# Patient Record
Sex: Female | Born: 1945
Health system: Southern US, Community
[De-identification: ages and names within clinical notes are randomized; demographics above are authoritative.]

## PROBLEM LIST (undated history)

## (undated) DIAGNOSIS — T7840XA Allergy, unspecified, initial encounter: Secondary | ICD-10-CM

## (undated) DIAGNOSIS — Z5189 Encounter for other specified aftercare: Secondary | ICD-10-CM

## (undated) DIAGNOSIS — K5790 Diverticulosis of intestine, part unspecified, without perforation or abscess without bleeding: Secondary | ICD-10-CM

## (undated) DIAGNOSIS — IMO0001 Reserved for inherently not codable concepts without codable children: Secondary | ICD-10-CM

## (undated) DIAGNOSIS — H269 Unspecified cataract: Secondary | ICD-10-CM

## (undated) DIAGNOSIS — M1990 Unspecified osteoarthritis, unspecified site: Secondary | ICD-10-CM

## (undated) DIAGNOSIS — E039 Hypothyroidism, unspecified: Secondary | ICD-10-CM

## (undated) DIAGNOSIS — M8580 Other specified disorders of bone density and structure, unspecified site: Secondary | ICD-10-CM

## (undated) HISTORY — DX: Diverticulosis of intestine, part unspecified, without perforation or abscess without bleeding: K57.90

## (undated) HISTORY — PX: BUNIONECTOMY: SHX129

## (undated) HISTORY — DX: Allergy, unspecified, initial encounter: T78.40XA

## (undated) HISTORY — DX: Reserved for inherently not codable concepts without codable children: IMO0001

## (undated) HISTORY — PX: TONSILLECTOMY: SUR1361

## (undated) HISTORY — PX: BLEPHAROPLASTY: SUR158

## (undated) HISTORY — DX: Encounter for other specified aftercare: Z51.89

## (undated) HISTORY — DX: Other specified disorders of bone density and structure, unspecified site: M85.80

## (undated) HISTORY — PX: TUBAL LIGATION: SHX77

## (undated) HISTORY — DX: Unspecified cataract: H26.9

## (undated) HISTORY — PX: ABDOMINAL HYSTERECTOMY: SHX81

## (undated) HISTORY — DX: Unspecified osteoarthritis, unspecified site: M19.90

## (undated) HISTORY — DX: Hypothyroidism, unspecified: E03.9

---

## 1986-11-03 HISTORY — PX: VAGINAL HYSTERECTOMY: SHX2639

## 1999-09-09 ENCOUNTER — Other Ambulatory Visit: Admission: RE | Admit: 1999-09-09 | Discharge: 1999-09-09 | Payer: Self-pay | Admitting: Obstetrics and Gynecology

## 2000-09-10 ENCOUNTER — Other Ambulatory Visit: Admission: RE | Admit: 2000-09-10 | Discharge: 2000-09-10 | Payer: Self-pay | Admitting: Obstetrics and Gynecology

## 2001-09-13 ENCOUNTER — Other Ambulatory Visit: Admission: RE | Admit: 2001-09-13 | Discharge: 2001-09-13 | Payer: Self-pay | Admitting: Obstetrics and Gynecology

## 2001-11-03 DIAGNOSIS — K5790 Diverticulosis of intestine, part unspecified, without perforation or abscess without bleeding: Secondary | ICD-10-CM

## 2001-11-03 HISTORY — DX: Diverticulosis of intestine, part unspecified, without perforation or abscess without bleeding: K57.90

## 2002-06-16 LAB — HM COLONOSCOPY

## 2004-10-04 ENCOUNTER — Ambulatory Visit: Payer: Self-pay | Admitting: Internal Medicine

## 2004-10-10 ENCOUNTER — Ambulatory Visit: Payer: Self-pay | Admitting: Internal Medicine

## 2004-12-02 ENCOUNTER — Ambulatory Visit (HOSPITAL_COMMUNITY): Admission: RE | Admit: 2004-12-02 | Discharge: 2004-12-02 | Payer: Self-pay | Admitting: Internal Medicine

## 2004-12-02 ENCOUNTER — Ambulatory Visit: Payer: Self-pay | Admitting: Internal Medicine

## 2005-11-06 ENCOUNTER — Ambulatory Visit: Payer: Self-pay | Admitting: Internal Medicine

## 2005-11-11 ENCOUNTER — Ambulatory Visit: Payer: Self-pay | Admitting: Internal Medicine

## 2006-09-14 ENCOUNTER — Ambulatory Visit: Payer: Self-pay | Admitting: Internal Medicine

## 2006-12-04 DIAGNOSIS — E039 Hypothyroidism, unspecified: Secondary | ICD-10-CM

## 2006-12-04 HISTORY — DX: Hypothyroidism, unspecified: E03.9

## 2006-12-16 LAB — CONVERTED CEMR LAB: Pap Smear: NORMAL

## 2006-12-22 ENCOUNTER — Ambulatory Visit: Payer: Self-pay | Admitting: Internal Medicine

## 2006-12-22 LAB — CONVERTED CEMR LAB
AST: 20 units/L (ref 0–37)
Alkaline Phosphatase: 46 units/L (ref 39–117)
BUN: 17 mg/dL (ref 6–23)
Basophils Relative: 0.9 % (ref 0.0–1.0)
CO2: 29 meq/L (ref 19–32)
Chloride: 107 meq/L (ref 96–112)
Creatinine, Ser: 0.8 mg/dL (ref 0.4–1.2)
HCT: 42.2 % (ref 36.0–46.0)
Hemoglobin: 14.3 g/dL (ref 12.0–15.0)
Leukocytes, UA: NEGATIVE
Monocytes Absolute: 0.8 10*3/uL — ABNORMAL HIGH (ref 0.2–0.7)
Monocytes Relative: 15.4 % — ABNORMAL HIGH (ref 3.0–11.0)
Neutrophils Relative %: 48.8 % (ref 43.0–77.0)
Potassium: 4.1 meq/L (ref 3.5–5.1)
RDW: 11.9 % (ref 11.5–14.6)
Specific Gravity, Urine: 1.025 (ref 1.000–1.03)
TSH: 9.12 microintl units/mL — ABNORMAL HIGH (ref 0.35–5.50)
Total Bilirubin: 0.7 mg/dL (ref 0.3–1.2)
Total Protein, Urine: NEGATIVE mg/dL
Total Protein: 6.5 g/dL (ref 6.0–8.3)
Urobilinogen, UA: 0.2 (ref 0.0–1.0)
VLDL: 9 mg/dL (ref 0–40)
pH: 6 (ref 5.0–8.0)

## 2006-12-29 ENCOUNTER — Ambulatory Visit: Payer: Self-pay | Admitting: Internal Medicine

## 2007-02-24 ENCOUNTER — Ambulatory Visit: Payer: Self-pay | Admitting: Internal Medicine

## 2007-07-08 ENCOUNTER — Encounter: Payer: Self-pay | Admitting: Internal Medicine

## 2007-07-08 ENCOUNTER — Ambulatory Visit: Payer: Self-pay | Admitting: Internal Medicine

## 2007-07-08 DIAGNOSIS — L568 Other specified acute skin changes due to ultraviolet radiation: Secondary | ICD-10-CM | POA: Insufficient documentation

## 2007-07-08 DIAGNOSIS — H1013 Acute atopic conjunctivitis, bilateral: Secondary | ICD-10-CM | POA: Insufficient documentation

## 2007-07-08 DIAGNOSIS — N951 Menopausal and female climacteric states: Secondary | ICD-10-CM | POA: Insufficient documentation

## 2007-07-08 DIAGNOSIS — J309 Allergic rhinitis, unspecified: Secondary | ICD-10-CM | POA: Insufficient documentation

## 2007-08-25 ENCOUNTER — Ambulatory Visit: Payer: Self-pay | Admitting: Internal Medicine

## 2007-09-13 ENCOUNTER — Encounter: Payer: Self-pay | Admitting: Internal Medicine

## 2007-11-12 ENCOUNTER — Encounter: Payer: Self-pay | Admitting: Internal Medicine

## 2007-12-24 ENCOUNTER — Ambulatory Visit: Payer: Self-pay | Admitting: Internal Medicine

## 2007-12-24 LAB — CONVERTED CEMR LAB
AST: 20 units/L (ref 0–37)
Albumin: 4.2 g/dL (ref 3.5–5.2)
Alkaline Phosphatase: 47 units/L (ref 39–117)
BUN: 22 mg/dL (ref 6–23)
Basophils Absolute: 0 10*3/uL (ref 0.0–0.1)
Bilirubin Urine: NEGATIVE
Chloride: 105 meq/L (ref 96–112)
Creatinine, Ser: 0.9 mg/dL (ref 0.4–1.2)
Eosinophils Absolute: 0.1 10*3/uL (ref 0.0–0.6)
GFR calc non Af Amer: 67 mL/min
HDL: 64.6 mg/dL (ref >39.0)
Hemoglobin, Urine: NEGATIVE
LDL Cholesterol: 92 mg/dL (ref 0–99)
MCHC: 33.1 g/dL (ref 30.0–36.0)
MCV: 91.2 fL (ref 78.0–100.0)
Monocytes Relative: 16.2 % — ABNORMAL HIGH (ref 3.0–11.0)
Potassium: 4.4 meq/L (ref 3.5–5.1)
RBC: 4.62 M/uL (ref 3.87–5.11)
RDW: 12 % (ref 11.5–14.6)
TSH: 2.87 microintl units/mL (ref 0.35–5.50)
Total Bilirubin: 0.8 mg/dL (ref 0.3–1.2)
Total CHOL/HDL Ratio: 2.8
Triglycerides: 130 mg/dL (ref 0–149)
Urine Glucose: NEGATIVE mg/dL
Urobilinogen, UA: 0.2 (ref 0.0–1.0)
VLDL: 26 mg/dL (ref 0–40)
pH: 5.5 (ref 5.0–8.0)

## 2007-12-31 ENCOUNTER — Ambulatory Visit: Payer: Self-pay | Admitting: Internal Medicine

## 2008-02-28 ENCOUNTER — Encounter: Payer: Self-pay | Admitting: Internal Medicine

## 2008-08-21 ENCOUNTER — Encounter: Payer: Self-pay | Admitting: Internal Medicine

## 2008-08-22 ENCOUNTER — Encounter: Payer: Self-pay | Admitting: Internal Medicine

## 2008-12-15 ENCOUNTER — Telehealth: Payer: Self-pay | Admitting: Internal Medicine

## 2009-02-05 ENCOUNTER — Ambulatory Visit: Payer: Self-pay | Admitting: Internal Medicine

## 2009-02-05 LAB — CONVERTED CEMR LAB
ALT: 19 units/L (ref 0–35)
AST: 20 units/L (ref 0–37)
Albumin: 3.9 g/dL (ref 3.5–5.2)
Alkaline Phosphatase: 53 units/L (ref 39–117)
BUN: 16 mg/dL (ref 6–23)
Basophils Absolute: 0 10*3/uL (ref 0.0–0.1)
Bilirubin Urine: NEGATIVE
Bilirubin, Direct: 0.1 mg/dL (ref 0.0–0.3)
Calcium: 8.8 mg/dL (ref 8.4–10.5)
Cholesterol: 211 mg/dL — ABNORMAL HIGH (ref 0–200)
Creatinine, Ser: 0.9 mg/dL (ref 0.4–1.2)
Direct LDL: 97.6 mg/dL
Eosinophils Relative: 2.4 % (ref 0.0–5.0)
GFR calc non Af Amer: 66.78 mL/min (ref >60)
Glucose, Bld: 79 mg/dL (ref 70–99)
HCT: 41.6 % (ref 36.0–46.0)
HDL: 90.2 mg/dL (ref >39.00)
Hemoglobin, Urine: NEGATIVE
Hemoglobin: 13.9 g/dL (ref 12.0–15.0)
Ketones, ur: NEGATIVE mg/dL
Leukocytes, UA: NEGATIVE
Lymphocytes Relative: 35.3 % (ref 12.0–46.0)
MCHC: 33.3 g/dL (ref 30.0–36.0)
Monocytes Relative: 12.7 % — ABNORMAL HIGH (ref 3.0–12.0)
Neutro Abs: 2.6 10*3/uL (ref 1.4–7.7)
Neutrophils Relative %: 49 % (ref 43.0–77.0)
Platelets: 211 10*3/uL (ref 150.0–400.0)
Potassium: 4.3 meq/L (ref 3.5–5.1)
RDW: 12.4 % (ref 11.5–14.6)
Sodium: 143 meq/L (ref 135–145)
Specific Gravity, Urine: 1.02 (ref 1.000–1.030)
TSH: 1.55 microintl units/mL (ref 0.35–5.50)
Total Bilirubin: 0.6 mg/dL (ref 0.3–1.2)
Total Protein, Urine: NEGATIVE mg/dL
Total Protein: 6.5 g/dL (ref 6.0–8.3)
Triglycerides: 57 mg/dL (ref 0.0–149.0)
Urine Glucose: NEGATIVE mg/dL
VLDL: 11.4 mg/dL (ref 0.0–40.0)
WBC: 5.3 10*3/uL (ref 4.5–10.5)
pH: 6 (ref 5.0–8.0)

## 2009-02-09 ENCOUNTER — Ambulatory Visit: Payer: Self-pay | Admitting: Internal Medicine

## 2009-02-09 DIAGNOSIS — E663 Overweight: Secondary | ICD-10-CM | POA: Insufficient documentation

## 2009-03-01 ENCOUNTER — Encounter: Payer: Self-pay | Admitting: Internal Medicine

## 2009-03-01 ENCOUNTER — Telehealth: Payer: Self-pay | Admitting: Internal Medicine

## 2009-03-01 LAB — HM DEXA SCAN: HM Dexa Scan: NORMAL

## 2009-08-20 ENCOUNTER — Telehealth (INDEPENDENT_AMBULATORY_CARE_PROVIDER_SITE_OTHER): Payer: Self-pay | Admitting: *Deleted

## 2009-12-07 ENCOUNTER — Telehealth: Payer: Self-pay | Admitting: Internal Medicine

## 2010-02-05 ENCOUNTER — Ambulatory Visit: Payer: Self-pay | Admitting: Internal Medicine

## 2010-02-05 LAB — CONVERTED CEMR LAB
Bilirubin, Direct: 0.1 mg/dL (ref 0.0–0.3)
Chloride: 106 meq/L (ref 96–112)
Cholesterol: 176 mg/dL (ref 0–200)
Eosinophils Absolute: 0.1 10*3/uL (ref 0.0–0.7)
GFR calc non Af Amer: 76.26 mL/min (ref >60)
Glucose, Bld: 79 mg/dL (ref 70–99)
HDL: 68.6 mg/dL (ref >39.00)
LDL Cholesterol: 87 mg/dL (ref 0–99)
Lymphocytes Relative: 39.8 % (ref 12.0–46.0)
MCHC: 34 g/dL (ref 30.0–36.0)
MCV: 92.1 fL (ref 78.0–100.0)
Monocytes Absolute: 0.8 10*3/uL (ref 0.1–1.0)
Neutrophils Relative %: 41.8 % — ABNORMAL LOW (ref 43.0–77.0)
Nitrite: NEGATIVE
Platelets: 265 10*3/uL (ref 150.0–400.0)
Potassium: 4.6 meq/L (ref 3.5–5.1)
Sodium: 141 meq/L (ref 135–145)
Specific Gravity, Urine: >=1.03 (ref 1.000–1.030)
Total Bilirubin: 0.7 mg/dL (ref 0.3–1.2)
Total Protein, Urine: NEGATIVE mg/dL
VLDL: 20 mg/dL (ref 0.0–40.0)
WBC: 5.4 10*3/uL (ref 4.5–10.5)
pH: 5.5 (ref 5.0–8.0)

## 2010-02-11 ENCOUNTER — Ambulatory Visit: Payer: Self-pay | Admitting: Internal Medicine

## 2010-03-18 ENCOUNTER — Encounter: Payer: Self-pay | Admitting: Internal Medicine

## 2010-03-18 LAB — HM MAMMOGRAPHY: HM Mammogram: NORMAL

## 2010-03-20 ENCOUNTER — Telehealth: Payer: Self-pay | Admitting: Internal Medicine

## 2010-04-18 ENCOUNTER — Ambulatory Visit: Payer: Self-pay | Admitting: Internal Medicine

## 2010-12-03 NOTE — Assessment & Plan Note (Signed)
Summary: LOWER ABDOMINAL PAIN-LB   Vital Signs:  Patient profile:   65 year old female Height:      58 inches Weight:      146 pounds BMI:     30.62 O2 Sat:      97 % on Room air Temp:     96.8 degrees F oral Pulse rate:   65 / minute BP sitting:   126 / 78  (left arm) Cuff size:   regular  Vitals Entered By: Bill Salinas CMA (April 18, 2010 9:53 AM)  O2 Flow:  Room air CC: Pt here with c/o intermitent lower abd pain. She denies any constipation or diarrhea and denies any blood in her stool. She also had a pelvic evaluation with Dr Tresa Res about 2 weeks ago which turned out normal/ ab   Primary Care Provider:  Norins  CC:  Pt here with c/o intermitent lower abd pain. She denies any constipation or diarrhea and denies any blood in her stool. She also had a pelvic evaluation with Dr Tresa Res about 2 weeks ago which turned out normal/ ab.  History of Present Illness: Patient presents with a complaint of lower abdominal discomfort that is intermittent. It is in the bilateral lowere abdomen. She denies any dysuria or symptoms of UTI. She has had no fever and no severe point tenderness to suggest diverticulitis. She has diverticulosis by colonoscopy. She has been "running" hard and has skipped meals. The discomfort is not disabling.   Current Medications (verified): 1)  Vivelle-Dot 0.0375 Mg/24hr  Pttw (Estradiol) .... Twice Weekly 2)  Astepro 0.15 % Soln (Azelastine Hcl) .... As Needed. 3)  Caltrate 600+d 600-400 Mg-Unit  Tabs (Calcium Carbonate-Vitamin D) .... Take 1 Tablet By Mouth Once A Day 4)  Fish Oil   Oil (Fish Oil) .... Once Daily 5)  Levothyroxine Sodium 50 Mcg  Tabs (Levothyroxine Sodium) .... Take 1 By Mouth Qd 6)  Zyrtec Allergy 10 Mg  Tabs (Cetirizine Hcl) .... As Needed 7)  Allergy Shot .... Weekly 8)  Flonase 50 Mcg/act Susp (Fluticasone Propionate) .... One Spray Each Nostril Once Daily As Needed 9)  Zolpidem Tartrate 10 Mg Tabs (Zolpidem Tartrate) .... Take 1/2 Tab By  Mouth At Bedtime As Needed 10)  Centrum Silver Ultra Womens  Tabs (Multiple Vitamins-Minerals) .Marland Kitchen.. 1 Tab Daily 11)  Biotin Maximum Strength 5000 Mcg Caps (Biotin) .Marland Kitchen.. 1 Daily 12)  Zantac 150 Mg Tabs (Ranitidine Hcl) .... As Needed 13)  Lotrisone 1-0.05 % Crea (Clotrimazole-Betamethasone) .... As Needed  Allergies (verified): No Known Drug Allergies PMH-FH-SH reviewed-no changes except otherwise noted  Review of Systems       The patient complains of abdominal pain.  The patient denies anorexia, fever, weight loss, weight gain, melena, hematochezia, severe indigestion/heartburn, hematuria, incontinence, unusual weight change, and enlarged lymph nodes.    Physical Exam  General:  WNWD fit appearing white female looking younger than her stated age. Head:  normocephalic and atraumatic.   Lungs:  normal respiratory effort and normal breath sounds.   Heart:  normal rate and regular rhythm.   Abdomen:  soft, non-tender, normal bowel sounds, no distention, no masses, no guarding, and no hepatomegaly.  Minimal to no tenderness to deep palpation in the lower abdomen.   Impression & Recommendations:  Problem # 1:  ABDOMINAL PAIN, LOWER (ICD-789.09) Unremarkable exam. No evidence of diverticulitis.   Plan - increase fiber in diet or consider taking a bulk laxative  no need for further testing at this time.   Complete Medication List: 1)  Vivelle-dot 0.0375 Mg/24hr Pttw (Estradiol) .... Twice weekly 2)  Astepro 0.15 % Soln (Azelastine hcl) .... As needed. 3)  Caltrate 600+d 600-400 Mg-unit Tabs (Calcium carbonate-vitamin d) .... Take 1 tablet by mouth once a day 4)  Fish Oil Oil (Fish oil) .... Once daily 5)  Levothyroxine Sodium 50 Mcg Tabs (Levothyroxine sodium) .... Take 1 by mouth qd 6)  Zyrtec Allergy 10 Mg Tabs (Cetirizine hcl) .... As needed 7)  Allergy Shot  .... Weekly 8)  Flonase 50 Mcg/act Susp (Fluticasone propionate) .... One spray each nostril once daily as  needed 9)  Zolpidem Tartrate 10 Mg Tabs (Zolpidem tartrate) .... Take 1/2 tab by mouth at bedtime as needed 10)  Centrum Silver Ultra Womens Tabs (Multiple vitamins-minerals) .Marland Kitchen.. 1 tab daily 11)  Biotin Maximum Strength 5000 Mcg Caps (Biotin) .Marland Kitchen.. 1 daily 12)  Zantac 150 Mg Tabs (Ranitidine hcl) .... As needed 13)  Lotrisone 1-0.05 % Crea (Clotrimazole-betamethasone) .... As needed

## 2010-12-03 NOTE — Assessment & Plan Note (Signed)
Summary: PHYSICAL-STC   Vital Signs:  Patient profile:   65 year old female Height:      58 inches Weight:      147 pounds BMI:     30.83 O2 Sat:      95 % on Room air Temp:     96.6 degrees F oral Pulse rate:   69 / minute BP sitting:   128 / 80  (left arm) Cuff size:   regular  Vitals Entered By: Bill Salinas CMA (February 11, 2010 1:28 PM)  O2 Flow:  Room air CC: pt here for cpx, she is due for a mammogram in may 2011 and see Dr Aram Beecham Romine every other year for paps/ ab  Vision Screening:      Vision Comments: 08/03/2009 pt had an eye exam with Dr Nile Riggs  Vision Entered By: Bill Salinas CMA (February 11, 2010 1:30 PM)   Primary Care Provider:  Norins  CC:  pt here for cpx and she is due for a mammogram in may 2011 and see Dr Aram Beecham Romine every other year for paps/ ab.  History of Present Illness: Presents for routine follow-up. Interval history - had a UTI in the fall and treated. She did see Dr. Tresa Res. She has been getting vit d. We discussed recent presentation  by Dr. Ruthann Cancer at [facility] - normal vit d level is between 20-40.  Atrophic vaginitis treatment - twice a week. Saw Dr. Jethro Bolus for allergy follow-up.   Current Medications (verified): 1)  Vivelle-Dot 0.0375 Mg/24hr  Pttw (Estradiol) .... Twice Weekly 2)  Astelin 137 Mcg/spray  Soln (Azelastine Hcl) .... Two Times A Day 3)  Caltrate 600+d 600-400 Mg-Unit  Tabs (Calcium Carbonate-Vitamin D) .... Take 1 Tablet By Mouth Once A Day 4)  Fish Oil   Oil (Fish Oil) .... Once Daily 5)  Levothyroxine Sodium 50 Mcg  Tabs (Levothyroxine Sodium) .... Take 1 By Mouth Qd 6)  Zyrtec Allergy 10 Mg  Tabs (Cetirizine Hcl) .... As Needed 7)  Allergy Shot .... Weekly 8)  Vitamin D 64332 Unit Caps (Ergocalciferol) .... Take 1 Tablet By Mouth Once A Week 9)  Flonase 50 Mcg/act Susp (Fluticasone Propionate) .... One Spray Each Nostril Once Daily As Needed 10)  Zolpidem Tartrate 10 Mg Tabs (Zolpidem Tartrate) .... Take  1/2 Tab By Mouth At Bedtime As Needed 11)  Centrum Silver Ultra Womens  Tabs (Multiple Vitamins-Minerals) .Marland Kitchen.. 1 Tab Daily 12)  Biotin Maximum Strength 5000 Mcg Caps (Biotin) .Marland Kitchen.. 1 Daily 13)  Zantac 150 Mg Tabs (Ranitidine Hcl) .... As Needed  Allergies (verified): No Known Drug Allergies  Past History:  Past Medical History: Last updated: Jan 11, 2008 Allergic rhinitis vitamin D deficiency  Past Surgical History: Last updated: 07/08/2007 Tonsillectomy bunionectomy,right hammertoe surgery '01 bunionectomy left hammertoe surgery '06 Hysterectomy blephroplasty '03  Family History: Last updated: 01/11/2008 father died lung cancer age 47, had AAA mother CAD, PVD, breast Cancer at age 63 Neg-colon cancer, no MI maternal grandmother with DM  Social History: Last updated: 01/11/08 college grad - elementary education married '67  son-'69, dtr-'71 4 grandchildren very active in the community  Review of Systems  The patient denies anorexia, fever, weight loss, weight gain, vision loss, hoarseness, chest pain, syncope, peripheral edema, prolonged cough, hemoptysis, abdominal pain, hematochezia, hematuria, genital sores, suspicious skin lesions, transient blindness, depression, unusual weight change, enlarged lymph nodes, and angioedema.    Physical Exam  General:  Well-developed,well-nourished,in no acute distress; alert,appropriate and cooperative throughout examination  Head:  Normocephalic and atraumatic without obvious abnormalities. No apparent alopecia or balding. Eyes:  No corneal or conjunctival inflammation noted. EOMI. Perrla. Funduscopic exam benign, without hemorrhages, exudates or papilledema. Vision grossly normal. Ears:  External ear exam shows no significant lesions or deformities.  Otoscopic examination reveals clear canals, tympanic membranes are intact bilaterally without bulging, retraction, inflammation or discharge. Hearing is grossly normal  bilaterally. Nose:  no external deformity and no external erythema.   Mouth:  Oral mucosa and oropharynx without lesions or exudates.  Teeth in good repair. Neck:  No deformities, masses, or tenderness noted. Chest Wall:  No deformities, masses, or tenderness noted. Breasts:  deferred to gyn Lungs:  Normal respiratory effort, chest expands symmetrically. Lungs are clear to auscultation, no crackles or wheezes. Heart:  Normal rate and regular rhythm. S1 and S2 normal without gallop, murmur, click, rub or other extra sounds. Abdomen:  soft, non-tender, normal bowel sounds, no distention, no guarding, no rigidity, and no hepatomegaly.   Genitalia:  deferred to gyn Msk:  normal ROM, no joint tenderness, no joint swelling, no joint warmth, no redness over joints, no joint instability, and no crepitation.   Pulses:  2+ radial and DP pulses Extremities:  No clubbing, cyanosis, edema, or deformity noted with normal full range of motion of all joints.   Neurologic:  No cranial nerve deficits noted. Station and gait are normal. Plantar reflexes are down-going bilaterally. DTRs are symmetrical throughout. Sensory, motor and coordinative functions appear intact. Skin:  turgor normal, color normal, and no rashes.   Cervical Nodes:  no anterior cervical adenopathy and no posterior cervical adenopathy.   Axillary Nodes:  no R axillary adenopathy and no L axillary adenopathy.   Psych:  Oriented X3, memory intact for recent and remote, normally interactive, good eye contact, and not anxious appearing.     Impression & Recommendations:  Problem # 1:  PHOTOALLERGIC DERMATITIS (ICD-692.72) Doing better and using topical only as needed  Her updated medication list for this problem includes:    Zyrtec Allergy 10 Mg Tabs (Cetirizine hcl) .Marland Kitchen... As needed  Problem # 2:  Preventive Health Care (ICD-V70.0) Normal exam. Unremarkable history. Current with Gyn. Up to date with colorectal cncer screening, mammography  and immuniations.  In summary - a very nice woman who is mdedically stable and doing well.   Complete Medication List: 1)  Vivelle-dot 0.0375 Mg/24hr Pttw (Estradiol) .... Twice weekly 2)  Astepro 0.15 % Soln (Azelastine hcl) .... As needed. 3)  Caltrate 600+d 600-400 Mg-unit Tabs (Calcium carbonate-vitamin d) .... Take 1 tablet by mouth once a day 4)  Fish Oil Oil (Fish oil) .... Once daily 5)  Levothyroxine Sodium 50 Mcg Tabs (Levothyroxine sodium) .... Take 1 by mouth qd 6)  Zyrtec Allergy 10 Mg Tabs (Cetirizine hcl) .... As needed 7)  Allergy Shot  .... Weekly 8)  Vitamin D 27253 Unit Caps (Ergocalciferol) .... Take 1 tablet by mouth once a week 9)  Flonase 50 Mcg/act Susp (Fluticasone propionate) .... One spray each nostril once daily as needed 10)  Zolpidem Tartrate 10 Mg Tabs (Zolpidem tartrate) .... Take 1/2 tab by mouth at bedtime as needed 11)  Centrum Silver Ultra Womens Tabs (Multiple vitamins-minerals) .Marland Kitchen.. 1 tab daily 12)  Biotin Maximum Strength 5000 Mcg Caps (Biotin) .Marland Kitchen.. 1 daily 13)  Zantac 150 Mg Tabs (Ranitidine hcl) .... As needed 14)  Lotrisone 1-0.05 % Crea (Clotrimazole-betamethasone) .... As needed  Other Orders: TD Toxoids IM 7 YR + (66440) Admin  1st Vaccine [phone])    Patient: Maria Montgomery Note: All result statuses are Final unless otherwise noted.  Tests: (1) BMP (METABOL)   Sodium                    141 mEq/L                   135-145   Potassium                 4.6 mEq/L                   3.5-5.1   Chloride                  106 mEq/L                   96-112   Carbon Dioxide            28 mEq/L                    19-32   Glucose                   79 mg/dL                    37-10   BUN                       23 mg/dL                    6-26   Creatinine                0.8 mg/dL                   9.4-8.5   Calcium                   9.3 mg/dL                   4.6-27.0   GFR                       76.26 mL/min                >60  Tests: (2) Lipid Panel  (LIPID)   Cholesterol               176 mg/dL                   3-500     ATP III Classification            Desirable:  < 200 mg/dL                    Borderline High:  200 - 239 mg/dL               High:  > = 240 mg/dL   Triglycerides             100.0 mg/dL                 9.3-818.2     Normal:  <150 mg/dL     Borderline High:  993 - 199 mg/dL   HDL                       71.69 mg/dL                 >  39.00   VLDL Cholesterol          20.0 mg/dL                  1.6-10.9   LDL Cholesterol           87 mg/dL                    6-04  CHO/HDL Ratio:  CHD Risk                             3                    Men          Women     1/2 Average Risk     3.4          3.3     Average Risk          5.0          4.4     2X Average Risk          9.6          7.1     3X Average Risk          15.0          11.0                           Tests: (3) CBC Platelet w/Diff (CBCD)   White Cell Count          5.4 K/uL                    4.5-10.5   Red Cell Count            4.29 Mil/uL                 3.87-5.11   Hemoglobin                13.4 g/dL                   54.0-98.1   Hematocrit                39.5 %                      36.0-46.0   MCV                       92.1 fl                     78.0-100.0   MCHC                      34.0 g/dL                   19.1-47.8   RDW                       13.0 %                      11.5-14.6   Platelet Count            265.0 K/uL                  150.0-400.0  Neutrophil %         [L]  41.8 %                      43.0-77.0   Lymphocyte %              39.8 %                      12.0-46.0   Monocyte %           [H]  15.1 %                      3.0-12.0   Eosinophils%              2.4 %                       0.0-5.0   Basophils %               0.9 %                       0.0-3.0   Neutrophill Absolute      2.2 K/uL                    1.4-7.7   Lymphocyte Absolute       2.1 K/uL                    0.7-4.0   Monocyte Absolute         0.8 K/uL                     0.1-1.0  Eosinophils, Absolute                             0.1 K/uL                    0.0-0.7   Basophils Absolute        0.0 K/uL                    0.0-0.1  Tests: (4) Hepatic/Liver Function Panel (HEPATIC)   Total Bilirubin           0.7 mg/dL                   5.7-3.2   Direct Bilirubin          0.1 mg/dL                   2.0-2.5   Alkaline Phosphatase      44 U/L                      39-117   AST                       22 U/L                      0-37   ALT                       21 U/L                      0-35   Total Protein  6.6 g/dL                    1.6-1.0   Albumin                   4.2 g/dL                    9.6-0.4  Tests: (5) TSH (TSH)   FastTSH                   1.88 uIU/mL                 0.35-5.50  Tests: (6) UDip Only (UDIP)   Color                     YELLOW       RANGE:  Yellow;Lt. Yellow   Clarity                   SL CLOUDY                   Clear   Specific Gravity          >=1.030                     1.000 - 1.030   Urine Ph                  5.5                         5.0-8.0   Protein                   NEGATIVE                    Negative   Urine Glucose             NEGATIVE                    Negative   Ketones                   NEGATIVE                    Negative   Urine Bilirubin           NEGATIVE                    Negative   Blood                     NEGATIVE                    Negative   Urobilinogen              0.2                         0.0 - 1.0   Leukocyte Esterace        NEGATIVE                    Negative   Nitrite                   NEGATIVE                    NegativePrescriptions: LEVOTHYROXINE SODIUM 50 MCG  TABS (  LEVOTHYROXINE SODIUM) take 1 by mouth qd  #30 x 12   Entered and Authorized by:   Jacques Navy MD   Signed by:   Jacques Navy MD on 02/11/2010   Method used:   Electronically to        [facility] [address] [phone]* (retail)       [id number]       [place], [place]   [address]       Ph: [phone] or [phone]       Fax: [phone]   RxID:   [id number]      Immunization History:  Zostavax History:    Zostavax # 1:  zostavax (12/23/2006)  Immunizations Administered:  Tetanus Vaccine:    Vaccine Type: Td    Site: left deltoid    Mfr: Sanofi Pasteur    Dose: 0.5 ml    Route: IM    Given by: Ami Bullins CMA    Exp. Date: 09/18/2011    Lot #: [id number]    VIS given: 09/21/07 version given February 11, 2010.

## 2010-12-03 NOTE — Progress Notes (Signed)
    Preventive Care Screening  Mammogram:    Date:  03/18/2010    Results:  normal

## 2010-12-03 NOTE — Progress Notes (Signed)
Summary: Motion Sickness patches  Phone Note Call from Patient   Summary of Call: Patient called stating that she is going on a cruise and would like four dramamine patches to help her with motion sickness. If ok, please send to [facility] Initial call taken by: Rock Nephew CMA,  December 07, 2009 3:32 PM  Follow-up for Phone Call        ok for dramaimne or scopolamine ptaches, apply q 72 hours, # based on duration of cruise Follow-up by: Jacques Navy MD,  December 07, 2009 3:40 PM  Additional Follow-up for Phone Call Additional follow up Details #1::        lm with pt so I can verify length of cruise Additional Follow-up by: Ami Bullins CMA,  December 07, 2009 4:34 PM    Additional Follow-up for Phone Call Additional follow up Details #2::    Pt does not need rx, she says she got med thru husbands Dr  Follow-up by: Lamar Sprinkles, CMA,  December 10, 2009 5:14 PM

## 2011-02-13 ENCOUNTER — Other Ambulatory Visit: Payer: Self-pay | Admitting: Internal Medicine

## 2011-02-13 ENCOUNTER — Other Ambulatory Visit (INDEPENDENT_AMBULATORY_CARE_PROVIDER_SITE_OTHER): Payer: Managed Care, Other (non HMO)

## 2011-02-13 DIAGNOSIS — Z0000 Encounter for general adult medical examination without abnormal findings: Secondary | ICD-10-CM

## 2011-02-13 LAB — CBC WITH DIFFERENTIAL/PLATELET
Basophils Relative: 0.8 % (ref 0.0–3.0)
Eosinophils Absolute: 0.1 10*3/uL (ref 0.0–0.7)
Hemoglobin: 14.3 g/dL (ref 12.0–15.0)
Lymphocytes Relative: 23.3 % (ref 12.0–46.0)
MCHC: 34.2 g/dL (ref 30.0–36.0)
MCV: 93.8 fl (ref 78.0–100.0)
Neutro Abs: 4.5 10*3/uL (ref 1.4–7.7)
RBC: 4.44 Mil/uL (ref 3.87–5.11)

## 2011-02-13 LAB — URINALYSIS
Leukocytes, UA: NEGATIVE
Nitrite: NEGATIVE
Specific Gravity, Urine: 1.025 (ref 1.000–1.030)
pH: 6 (ref 5.0–8.0)

## 2011-02-13 LAB — HEPATIC FUNCTION PANEL
Albumin: 3.8 g/dL (ref 3.5–5.2)
Alkaline Phosphatase: 54 U/L (ref 39–117)
Total Protein: 6.8 g/dL (ref 6.0–8.3)

## 2011-02-13 LAB — LIPID PANEL: HDL: 70.3 mg/dL (ref >39.00)

## 2011-02-13 LAB — TSH: TSH: 2.85 u[IU]/mL (ref 0.35–5.50)

## 2011-02-13 LAB — BASIC METABOLIC PANEL
CO2: 28 mEq/L (ref 19–32)
Calcium: 9.2 mg/dL (ref 8.4–10.5)
Chloride: 102 mEq/L (ref 96–112)
Potassium: 4.9 mEq/L (ref 3.5–5.1)
Sodium: 136 mEq/L (ref 135–145)

## 2011-02-18 ENCOUNTER — Ambulatory Visit (INDEPENDENT_AMBULATORY_CARE_PROVIDER_SITE_OTHER): Payer: Managed Care, Other (non HMO) | Admitting: Internal Medicine

## 2011-02-18 VITALS — BP 116/78 | HR 57 | Temp 97.9°F | Ht <= 58 in | Wt 147.0 lb

## 2011-02-18 DIAGNOSIS — Z136 Encounter for screening for cardiovascular disorders: Secondary | ICD-10-CM

## 2011-02-18 MED ORDER — ZOLPIDEM TARTRATE 10 MG PO TABS: 5.0000 mg | ORAL_TABLET | Freq: Every evening | ORAL | Status: DC | PRN

## 2011-02-19 ENCOUNTER — Encounter: Payer: Self-pay | Admitting: Internal Medicine

## 2011-02-19 NOTE — Progress Notes (Signed)
Subjective:    Patient ID: Maria Montgomery, female    DOB: [birth year], 65 y.o.   MRN: [id number]  HPIMrs. Wild presents for a general wellness exam. She has had a good year with no medical illness, surgeries or injuries. She has been feeling well and doing well. She is current with her gynecologist.   She has had a discussion with her gynecologist in regard to continued hormone replacement therapy. She did try reducing the hormone patch to twice a week but found that she had increased symptoms of the climacteric and resumed twice a week application. She did raise a concern about the increased risk of ovarian cancer. Referred to "UpToDate.com" where it stated that there was a small increased risk of ovarian cancer in some studies. The conclusion was that the increased risk was so small that it is not considered a major consideration in making a decision about the use of long-term HRT. We also discussed the follow-up studies to the [facility] study and that with prolonged follow up the increased risk for breast cancer is real but small. In the final analysis it becomes a quality of life type decision: accepting the degree of risk (low) vs the enhanced quality of life/freedom from symptoms with treatment.   She is otherwise doing very well. She has a new, #5, grand-daughter, who is a Film/video editor.  Past Medical History  Diagnosis Date  . Allergic rhinitis   . Vitamin D deficiency    Past Surgical History  Procedure Date  . Tonsillectomy   . Bunionectomy     hammertoe right '01, hammertoe left '06  . Abdominal hysterectomy   . Blepharoplasty    Family History  Problem Relation Age of Onset  . Coronary artery disease Mother   . Cancer Father     lung cancer age 41  . Diabetes Paternal Uncle    History   Social History  . Marital Status: Married    Spouse Name: N/A    Number of Children: N/A  . Years of Education: N/A   Occupational History  . Not on file.   Social  History Main Topics  . Smoking status: Not on file  . Smokeless tobacco: Not on file  . Alcohol Use: Not on file  . Drug Use: Not on file  . Sexually Active: Not on file   Other Topics Concern  . Not on file   Social History Narrative   College grad- elementary education.  Married '67  Son-'69; Daughter- '71; 5 grandchildren. Very active in the community. Marriage is in good health      Review of Systems Review of Systems  Constitutional:  Negative for fever, chills, activity change and unexpected weight change.  HENT:  Negative for hearing loss, ear pain, congestion, neck stiffness and postnasal drip.   Eyes: Negative for pain, discharge and visual disturbance.  Respiratory: Negative for chest tightness and wheezing.   Cardiovascular: Negative for chest pain and palpitations.       [No decreased exercise tolerance Gastrointestinal: [No change in bowel habit. No bloating or gas. No reflux or indigestion Genitourinary: Negative for urgency, frequency, flank pain and difficulty urinating.  Musculoskeletal: Negative for myalgias, back pain, arthralgias and gait problem.  Neurological: Negative for dizziness, tremors, weakness and headaches.  Hematological: Negative for adenopathy.  Psychiatric/Behavioral: Negative for behavioral problems and dysphoric mood.         Objective:   Physical Exam Physical Exam  Constitutional: She is oriented to person, place,  and time. Vital signs are normal. She appears well-developed and well-nourished.       pleasant white  woman in no distress  HENT:  Head: Normocephalic and atraumatic.  Right Ear: External ear normal.  Left Ear: External ear normal.       EACs and TMs normal  Eyes: Conjunctivae and EOM are normal. Pupils are equal, round, and reactive to light. No scleral icterus.  Neck: Normal range of motion. Neck supple. No JVD present. No thyromegaly present.  Cardiovascular: Normal rate, regular rhythm and normal heart sounds.  Exam  reveals no friction rub.   No murmur heard.      Radial and ulnar pulse normal: trace DP pulse bilaterally, 1+ PT pulses; good capillary refill, no ulcerations Pulmonary/Chest: Effort normal and breath sounds normal. No respiratory distress. She has no wheezes. She has no rales.       No chest wall deformity with normal A-P diameter. Breast exam: Deferred to gyn  Abdominal: Soft. Bowel sounds are normal. She exhibits no distension and no mass. There is no tenderness. There is no guarding.       No hepatosplenomegaly  Genitourinary:       Exam deferred to gyn  Musculoskeletal: Normal range of motion. She exhibits no edema and no tenderness.       No joint swelling, no synovial thickening, no deformity of the small, medium or large joints.  Lymphadenopathy:    She has no cervical adenopathy.  Neurological: She is alert and oriented to person, place, and time. She has normal reflexes. No cranial nerve deficit. Coordination normal.       Normal facial symmetry, normal gross motor strength throughout, no tremor or cog-wheeling. Skin: Skin is warm and dry. No rash noted. No erythema.       No suspicious lesions. Normal turgor. Nails are normal.  Psychiatric: She has a normal mood and affect. Her behavior is normal. Thought content normal.       Normal recall     Lab Results  Component Value Date   WBC 7.4 02/13/2011   HGB 14.3 02/13/2011   HCT 41.7 02/13/2011   PLT 303.0 02/13/2011   CHOL 190 02/13/2011   TRIG 83.0 02/13/2011   HDL 70.30 02/13/2011   LDLDIRECT 97.6 02/05/2009   ALT 17 02/13/2011   AST 16 02/13/2011   NA 136 02/13/2011   K 4.9 02/13/2011   CL 102 02/13/2011   CREATININE 0.8 02/13/2011   BUN 20 02/13/2011   CO2 28 02/13/2011   TSH 2.85 02/13/2011   All lab results are in normal range. Cholesterol is particularly good.     Assessment & Plan:  1. Health maintenance - a delightful woman who appears to be medically stable. She is current with Gyn exam and breast exam, current with  mammography, current with colorectal cancer screening. Immunizations are up to date for tetnus, pneumonia and shingles. Had a full discussion of hormone replacement therapy and she will continue at this time. She will return as needed or in one year.

## 2011-03-21 NOTE — Assessment & Plan Note (Signed)
[facility]                           PRIMARY CARE OFFICE NOTE   Maria Montgomery, Maria Montgomery                        MRN:          [id number]  DATE:12/29/2006                            DOB:          02-23-1946    Maria Montgomery is a 65 year old woman who presents for followup evaluation  and exam.  She was last seen 09/14/2006 for a rash which did respond to  a short course of prednisone, Zyrtec, and ranitidine and lotions.  She  was to see Dr. Amy Swaziland for a dermatology consult, but her symptoms  improved prior to her appointment and this was cancelled.   The patient has been seen by her gynecologist, Dr. Tresa Res, and did have  a vitamin D 25 hydroxy level drawn that was 21 ng/mL and was prescribed  vitamin D 50,000 International Units twice a week for 6 months with a  recheck planned in 3 months.  I did review the patient's chart and found  her last DEXA scan from 2005 which showed that the LS spine had a T  score of -0.6; left hip, femoral neck, had a minus T score of -1.0.  Her  bone density had been relatively stable from November 2002 to April  2005. At this point, the patient's regimen consists of a calcium  replacement and the usual over the counter Vitamin D; and, now, with the  additional vitamin D as noted, the patient also has adequate sunshine  exposure.   The patient reports that she is generally feeling well and remains very  active.   PAST MEDICAL HISTORY:  Well-documented in my note of 11/11/2005.   CURRENT MEDICATIONS:  1. Vivelle 0.0375 mg to apply twice weekly.  2. Weekly allergy shots.  3. Calcium 600 mg daily.  4. Multivitamin.  5. Astelin nasal spray daily.  6. Os-Cal with D.  7. Fish oil 1200 mg daily.   PHYSICIAN:  Dr. Aram Beecham Romine for GYN., Dr. Stevphen Rochester for allergy,  Dr. Max Fickle for dermatology.   FAMILY HISTORY AND SOCIAL HISTORY:  Well-documented in my note of  11/11/2005.   REVIEW OF SYSTEMS:  The patient  has had no fevers, sweats or chills.  She has had a 4-pound weight loss in the last 6 weeks.  No sleep  problems are reported.  Ophthal: The patient's last exam was within the  last 12 months.  No ENT or dental problems.  No cardiovascular  complaints.  No respiratory problems.  GU: Significant for occasional  heartburn precipitated by rapid eating.  Zantac is taken for mild  dyspepsia intermittently.  GU:  Significant for urge incontinence,  usually related to her busy schedule and inadequate personal time.  MUSCULOSKELETAL:  No complaints.  The patient's skin, by her report, has  decreased turgor.  She has had what sounds like a mild dermabrasion.  She has not had recurrent rash on her arms.   PHYSICAL EXAMINATION:  Temperature 97.3, blood pressure 117/76, pulse  65, weight 150.  GENERAL APPEARANCE:  A well-nourished, well-groomed woman looking  younger than her stated chronically age in  no acute distress.  HEENT EXAM:  Normocephalic, atraumatic.  ENT:  TMs were normal.  OROPHARYNX:  Native dentition in good repair.  No buccal or palatal  lesions were noted. Posterior pharynx was clear.  Conjunctiva and  sclerae were clear.  PERRLA.  EOMI.  Funduscopic exam was unremarkable.  NECK:  Supple.  There was no thyromegaly.  NODES:  No adenopathy was noted in the submandibular cervical regions.  CHEST: No CVA tenderness.  LUNGS:  Clear with no rales, wheezes or rhonchi.  BREASTS:  Deferred to gynecology.  CARDIOVASCULAR:  2+ radial pulses.  JVD no carotid bruits.  She had a  quiet precordium with regular rate and rhythm without murmurs, rubs, or  gallops.  ABDOMEN:  Soft, no guarding or rebound.  No organosplenomegaly was  appreciated.  PELVIC AND RECTAL EXAMS:  Deferred to gynecology.  EXTREMITIES:  Without clubbing, cyanosis, or edema, no deformities are  noted.  DERM:  The patient had no obvious skin lesions.  Her skin turgor did  appear normal.  There was no skin tenting.   NEUROLOGIC EXAM:  The patient is awake, alert, and oriented to person,  place, time and context.  Cranial nerves II-XII are grossly intact.  Cerebellar function was grossly normal.  DTRs are 2+ and symmetrical  throughout.   CHART REVIEW:  Last colonoscopy 02/14/2002 with followup recommended in  2013.  Last mammogram 02/23/2006 which was unremarkable.  Last DEXA scan  already discussed.  The patient had abdominal ultrasound 2004 with  normal size abdominal aorta.   DATA BASE:  Hemoglobin 14.3 gm, white count 5100 with a normal  differential.  Chemistries were unremarkable with a serum glucose of 88,  potassium 4.1, liver functions were normal.  Cholesterol 188,  triglycerides 47, HDL 75.6, LDL 103.  GFR was estimated to be 77  mL/minute.  Free T4 was normal range at 0.7.  TSH was elevated at 9.12,  urinalysis was negative.   ASSESSMENT AND PLAN:  1. Allergic rhinitis.  The patient was seen by Dr. Blossom Hoops office on      a regular basis.  She does seem stable.  At today's visit there is      no indication of wheezing or sign of obvious flare of allergy.  She      has no cough, her lungs sound clear.  The plan is to continue her      present medications and immunization.  2. Bone health.  Patient with mild osteopenia of the left femoral      neck.  She is due for followup DEXA scan which she will schedule at      her convenience.  She is getting vitamin D supplementation as      noted; and will have follow up in 3 months.  3. Endocrine.  I reviewed the patient's chart going back to 2004.  Her      Free T4 has been in the 0.6 to 0.7 range consistently; however, she      has had a progressive change in TSH going from 4.18 to 7.92 and to      9.12 suggestive of early hypothyroid disease.  The patient is      basically asymptomatic except for some mild skin changes.  Plan      Levothyroxine 50 mcg once daily with patient to have followup     laboratory in 3 months in order to ascertain  the effect on TSH.  4. Health maintenance.  The patient  is current and up to date with      GYN, with [facility], and with colorectal cancer screening.  She      is given Zostavax at today's visit.  She will return at her      convenience for pneumonia vaccine when we are back in stock.   In summary, this is a very pleasant woman who seems to be medically  stable and healthy.  She is asked to return to see me on an as needed  basis.  She will return in 3 months.  For followup lab work to include a  vitamin D level, as well as TSH level.     Rosalyn Gess Norins, MD  Electronically Signed    MEN/MedQ  DD: 12/30/2006  DT: 12/30/2006  Job #: [id number]   cc:   [address] [place]   [place]  [address] Gwen Pounds P. Romine, M.D.

## 2011-03-28 ENCOUNTER — Encounter: Payer: Self-pay | Admitting: Internal Medicine

## 2011-09-10 ENCOUNTER — Ambulatory Visit (INDEPENDENT_AMBULATORY_CARE_PROVIDER_SITE_OTHER): Payer: Managed Care, Other (non HMO) | Admitting: Internal Medicine

## 2011-09-10 VITALS — BP 118/80 | HR 87 | Temp 98.4°F | Wt 147.0 lb

## 2011-09-10 DIAGNOSIS — Z23 Encounter for immunization: Secondary | ICD-10-CM

## 2011-09-10 DIAGNOSIS — L568 Other specified acute skin changes due to ultraviolet radiation: Secondary | ICD-10-CM

## 2011-09-11 NOTE — Assessment & Plan Note (Signed)
OK to use present emollients, creams and lotions. For pruritis - zyrtec bid and zantac bid.

## 2011-09-11 NOTE — Progress Notes (Signed)
  Subjective:    Patient ID: Maria Montgomery, female    DOB: 1946-10-28, 65 y.o.   MRN: [id number]  HPI Maria Montgomery has a chronic recurrent rash with 1-2 mm red lesions tht are pruritic mostly on the upper extremities that has defied diagnosis. She brings in several skin care products for review: Vit E preparations, emollients and moisturizers which she is trying with a modicum of success. Dr. Jethro Montgomery thinks this rash is an allergic manifestation.  Dr. Corinda Montgomery is retiring soon. She will continue with Dr. Aundra Montgomery (sic) at [facility]. He has advised her to take zyrtec bid.   She is generally in good health, exercising on a regular basis.  I have reviewed the patient's medical history in detail and updated the computerized patient record.    Review of Systems System review is negative for any constitutional, cardiac, pulmonary, GI or neuro symptoms or complaints other than as described in the HPI.     Objective:   Physical Exam Vitals normal Gen'l - WNWD healthy appearing white woman in no distress PUlm- normal respirations. Derm- punctate red lesions upper extremity. Non-blanching red lesion posterior distal right leg c/w capillary hemorrahage.       Assessment & Plan:

## 2012-02-04 ENCOUNTER — Other Ambulatory Visit: Payer: Self-pay | Admitting: Internal Medicine

## 2012-03-10 ENCOUNTER — Ambulatory Visit (INDEPENDENT_AMBULATORY_CARE_PROVIDER_SITE_OTHER): Payer: Managed Care, Other (non HMO) | Admitting: Internal Medicine

## 2012-03-10 ENCOUNTER — Other Ambulatory Visit (INDEPENDENT_AMBULATORY_CARE_PROVIDER_SITE_OTHER): Payer: Managed Care, Other (non HMO)

## 2012-03-10 ENCOUNTER — Encounter: Payer: Self-pay | Admitting: Internal Medicine

## 2012-03-10 VITALS — BP 110/62 | HR 64 | Temp 97.5°F | Resp 16 | Wt 147.0 lb

## 2012-03-10 DIAGNOSIS — J309 Allergic rhinitis, unspecified: Secondary | ICD-10-CM

## 2012-03-10 DIAGNOSIS — Z0000 Encounter for general adult medical examination without abnormal findings: Secondary | ICD-10-CM

## 2012-03-10 DIAGNOSIS — E039 Hypothyroidism, unspecified: Secondary | ICD-10-CM

## 2012-03-10 DIAGNOSIS — L568 Other specified acute skin changes due to ultraviolet radiation: Secondary | ICD-10-CM

## 2012-03-10 DIAGNOSIS — N951 Menopausal and female climacteric states: Secondary | ICD-10-CM

## 2012-03-10 DIAGNOSIS — Z1211 Encounter for screening for malignant neoplasm of colon: Secondary | ICD-10-CM

## 2012-03-10 LAB — COMPREHENSIVE METABOLIC PANEL
ALT: 18 U/L (ref 0–35)
AST: 21 U/L (ref 0–37)
Albumin: 4.1 g/dL (ref 3.5–5.2)
Alkaline Phosphatase: 47 U/L (ref 39–117)
Calcium: 9.2 mg/dL (ref 8.4–10.5)
Chloride: 103 mEq/L (ref 96–112)
Potassium: 4 mEq/L (ref 3.5–5.1)
Sodium: 138 mEq/L (ref 135–145)

## 2012-03-10 LAB — TSH: TSH: 3.56 u[IU]/mL (ref 0.35–5.50)

## 2012-03-10 MED ORDER — PREDNISONE 10 MG PO TABS: 10.0000 mg | ORAL_TABLET | Freq: Every day | ORAL | Status: DC

## 2012-03-10 NOTE — Progress Notes (Signed)
Subjective:    Patient ID: Maria Montgomery, female    DOB: [birth year], 66 y.o.   MRN: [id number]  HPI Maria Montgomery is here for annual wellness examination and management of other chronic and acute problems. She reports that she has been doing well except for some allergy problems. No major illness, surgery or injury. She is current with her gyn provider.    The risk factors are reflected in the social history.  The roster of all physicians providing medical care to patient - is listed in the Snapshot section of the chart.  Activities of daily living:  The patient is 100% inedpendent in all ADLs: dressing, toileting, feeding as well as independent mobility  Home safety : The patient has smoke detectors in the home. Fall - fall survey. They wear seatbelts. No firearms at home. There is no violence in the home.   There is no risks for hepatitis, STDs or HIV. There is no   history of blood transfusion. They have no travel history to infectious disease endemic areas of the world.  The patient has seen their dentist in the last six month. They have seen their eye doctor in the last year. They deny any hearing difficulty and have not had audiologic testing in the last year.  They do not  have excessive sun exposure. Discussed the need for sun protection: hats, long sleeves and use of sunscreen if there is significant sun exposure.   Diet: the importance of a healthy diet is discussed. They do have a healthy diet.  The patient has a regular exercise program: zumba, palides, aerobics ,  60 min duration, 3-4 per week.  The benefits of regular aerobic exercise were discussed.  Depression screen: there are no signs or vegative symptoms of depression- irritability, change in appetite, anhedonia, sadness/tearfullness.  Cognitive assessment: the patient manages all their financial and personal affairs and is actively engaged.   The following portions of the patient's history were reviewed and updated as  appropriate: allergies, current medications, past family history, past medical history,  past surgical history, past social history  and problem list.  Vision, hearing, body mass index were assessed and reviewed.   During the course of the visit the patient was educated and counseled about appropriate screening and preventive services including : fall prevention , diabetes screening, nutrition counseling, colorectal cancer screening, and recommended immunizations.  Past Medical History  Diagnosis Date  . Allergic rhinitis   . Vitamin d deficiency    Past Surgical History  Procedure Date  . Tonsillectomy   . Bunionectomy     hammertoe right '01, hammertoe left '06  . Abdominal hysterectomy   . Blepharoplasty    Family History  Problem Relation Age of Onset  . Coronary artery disease Mother   . Cancer Father     lung cancer age 25  . Diabetes Paternal Uncle    History   Social History  . Marital Status: Married    Spouse Name: N/A    Number of Children: N/A  . Years of Education: N/A   Occupational History  . Not on file.   Social History Main Topics  . Smoking status: Former Games developer  . Smokeless tobacco: Not on file  . Alcohol Use: Not on file  . Drug Use: Not on file  . Sexually Active: Not on file   Other Topics Concern  . Not on file   Social History Narrative   College grad- elementary education.  Married '67  Son-'69;  Daughter- '71; 5 grandchildren. Very active in the community. Marriage is in good health        Review of Systems Constitutional:  Negative for fever, chills, activity change and unexpected weight change.  HEENT:  Negative for hearing loss, ear pain, congestion, neck stiffness and postnasal drip. Negative for sore throat or swallowing problems. Negative for dental complaints.   Eyes: Negative for vision loss or change in visual acuity.  Respiratory: Negative for chest tightness and wheezing. Negative for DOE.   Cardiovascular: Negative for  chest pain or palpitations. No decreased exercise tolerance Gastrointestinal: No change in bowel habit. No bloating or gas. No reflux or indigestion Genitourinary: Negative for urgency, frequency, flank pain and difficulty urinating.  Musculoskeletal: Negative for myalgias, back pain, arthralgias and gait problem.  Neurological: Negative for dizziness, tremors, weakness and headaches.  Hematological: Negative for adenopathy.  Psychiatric/Behavioral: Negative for behavioral problems and dysphoric mood.   Lab Results  Component Value Date   WBC 7.4 02/13/2011   HGB 14.3 02/13/2011   HCT 41.7 02/13/2011   PLT 303.0 02/13/2011   GLUCOSE 85 03/10/2012   CHOL 190 02/13/2011   TRIG 83.0 02/13/2011   HDL 70.30 02/13/2011   LDLDIRECT 97.6 02/05/2009   LDLCALC 103* 02/13/2011   ALT 18 03/10/2012   AST 21 03/10/2012   NA 138 03/10/2012   K 4.0 03/10/2012   CL 103 03/10/2012   CREATININE 0.8 03/10/2012   BUN 19 03/10/2012   CO2 26 03/10/2012   TSH 3.56 03/10/2012       Objective:   Physical Exam Filed Vitals:   03/10/12 1453  BP: 110/62  Pulse: 64  Temp: 97.5 F (36.4 C)  Resp: 16   Wt Readings from Last 3 Encounters:  03/10/12 147 lb (66.679 kg)  09/10/11 147 lb (66.679 kg)  02/18/11 147 lb (66.679 kg)   Gen'l- well nourished, attractive womn in no distress HEENT_ C&S clear, PERRLA, EOMI, EACs/TMs normal, oropharynx with native dentition in good repair, no buccal or palatal lesions, throat is clear, no sinus tenderness Neck - supple, no thyromegaly Nodes - negative in the submandibular and cervical regions Cor- 2+ radial and DP pulses, no JVD, no carotid bruits, precordium is quiet, RRR w/o murmurs Pulm - normal respirations w/o rales or wheezes, no increased work of breathing Breast exam - deferred to gyn Pelvic - deferred to gyn Abdomen - BS+, soft, no HSM Ext- normal range of motion about all small, medium and large joints, no deformities noted. Neuro - Alert and oriented, speech clear, memory  intact; CN II-XII normal; MS equal through out; cerebellar function - no tremor, normal gait; DTR's 2+ and symmetrical Skin - clear       Assessment & Plan:

## 2012-03-12 DIAGNOSIS — Z0000 Encounter for general adult medical examination without abnormal findings: Secondary | ICD-10-CM | POA: Insufficient documentation

## 2012-03-12 NOTE — Assessment & Plan Note (Signed)
Good control on her present medical regimen

## 2012-03-12 NOTE — Assessment & Plan Note (Signed)
Interval history is negative for any major illness, surgery or injury. She has had a flare of allergy related symptoms. Physical exam, sans breast and pelvic, is normal. Previous lipid panel - normal range, no indication to repeat. Current lab results at normal. She is current for colorectal and breast cancer screening. Immunizations are up to date.  In summary - a charming woman who is medically stable and doing well. She is active and is careful with her diet. She is asked to return in 1 year or sooner as needed.

## 2012-03-12 NOTE — Assessment & Plan Note (Signed)
She reports that she is on a slow taper off Hormone replacement under the direction of her gyn provider. So far no particular symptoms.

## 2012-03-12 NOTE — Assessment & Plan Note (Signed)
Quiescent at this time

## 2012-03-12 NOTE — Assessment & Plan Note (Signed)
Very allergic but is currently doing well. She was recently seen by Dr. Barnetta Chapel at [facility].

## 2012-03-18 ENCOUNTER — Encounter: Payer: Self-pay | Admitting: *Deleted

## 2012-03-23 ENCOUNTER — Encounter: Payer: Self-pay | Admitting: Internal Medicine

## 2012-03-24 LAB — HM MAMMOGRAPHY: HM Mammogram: NORMAL

## 2012-04-02 ENCOUNTER — Encounter: Payer: Self-pay | Admitting: Internal Medicine

## 2012-06-02 ENCOUNTER — Ambulatory Visit (AMBULATORY_SURGERY_CENTER): Payer: Managed Care, Other (non HMO) | Admitting: *Deleted

## 2012-06-02 VITALS — Ht 60.0 in | Wt 145.7 lb

## 2012-06-02 DIAGNOSIS — Z1211 Encounter for screening for malignant neoplasm of colon: Secondary | ICD-10-CM

## 2012-06-02 MED ORDER — MOVIPREP 100 G PO SOLR: ORAL | Status: DC

## 2012-06-15 HISTORY — PX: COLONOSCOPY: SHX174

## 2012-06-16 ENCOUNTER — Encounter: Payer: Self-pay | Admitting: Internal Medicine

## 2012-06-16 ENCOUNTER — Ambulatory Visit (AMBULATORY_SURGERY_CENTER): Payer: Managed Care, Other (non HMO) | Admitting: Internal Medicine

## 2012-06-16 VITALS — BP 130/74 | HR 66 | Temp 97.6°F | Resp 17 | Ht 60.0 in | Wt 145.0 lb

## 2012-06-16 DIAGNOSIS — D126 Benign neoplasm of colon, unspecified: Secondary | ICD-10-CM

## 2012-06-16 DIAGNOSIS — Z1211 Encounter for screening for malignant neoplasm of colon: Secondary | ICD-10-CM

## 2012-06-16 MED ORDER — SODIUM CHLORIDE 0.9 % IV SOLN: 500.0000 mL | INTRAVENOUS | Status: DC

## 2012-06-16 NOTE — Op Note (Signed)
[facility] [address] [place], [place]  [address]  COLONOSCOPY PROCEDURE REPORT  PATIENT:  Joslynne, Klatt  MR#:  [id number] BIRTHDATE:  1946-08-03, 66 yrs. old  GENDER:  female ENDOSCOPIST:  Hedwig Morton. Juanda Chance, MD REF. BY: PROCEDURE DATE:  06/16/2012 PROCEDURE:  Colon with cold biopsy polypectomy ASA CLASS:  Class II INDICATIONS:  colorectal cancer screening, average risk last colon 2003 was normal MEDICATIONS:   MAC sedation, administered by CRNA, promethazine (Phenergan) 180 mg  DESCRIPTION OF PROCEDURE:   After the risks and benefits and of the procedure were explained, informed consent was obtained. Digital rectal exam was performed and revealed no rectal masses. The LB [id number] endoscope was introduced through the anus and advanced to the cecum, which was identified by both the appendix and ileocecal valve.  The quality of the prep was excellent, using MoviPrep.  The instrument was then slowly withdrawn as the colon was fully examined. <<PROCEDUREIMAGES>>  FINDINGS:  A diminutive polyp was found. at 50 cm, 3 mm polyp vs fold The polyp was removed using cold biopsy forceps.  Moderate diverticulosis was found throughout the colon (see image6 and image1).  This was otherwise a normal examination of the colon (see image8, image5, image4, and image3).   Retroflexed views in the rectum revealed no abnormalities.    The scope was then withdrawn from the patient and the procedure completed.  COMPLICATIONS:  None ENDOSCOPIC IMPRESSION: 1) Diminutive polyp 2) Moderate diverticulosis throughout the colon 3) Otherwise normal examination RECOMMENDATIONS: 1) Await pathology results 2) High fiber diet.  REPEAT EXAM:  In 10 year(s) for.  ______________________________ Hedwig Morton. Juanda Chance, MD  CC:  n. eSIGNED:   Hedwig Morton. Brodie at 06/16/2012 08:30 AM  Hal Morales, [phone]

## 2012-06-16 NOTE — Patient Instructions (Addendum)
Findings:  Polyp, Diverticulosis Recommendations:  High Fiber Diet  YOU HAD AN ENDOSCOPIC PROCEDURE TODAY AT THE [facility]: Refer to the procedure report that was given to you for any specific questions about what was found during the examination.  If the procedure report does not answer your questions, please call your gastroenterologist to clarify.  If you requested that your care partner not be given the details of your procedure findings, then the procedure report has been included in a sealed envelope for you to review at your convenience later.  YOU SHOULD EXPECT: Some feelings of bloating in the abdomen. Passage of more gas than usual.  Walking can help get rid of the air that was put into your GI tract during the procedure and reduce the bloating. If you had a lower endoscopy (such as a colonoscopy or flexible sigmoidoscopy) you may notice spotting of blood in your stool or on the toilet paper. If you underwent a bowel prep for your procedure, then you may not have a normal bowel movement for a few days.  DIET: Your first meal following the procedure should be a light meal and then it is ok to progress to your normal diet.  A half-sandwich or bowl of soup is an example of a good first meal.  Heavy or fried foods are harder to digest and may make you feel nauseous or bloated.  Likewise meals heavy in dairy and vegetables can cause extra gas to form and this can also increase the bloating.  Drink plenty of fluids but you should avoid alcoholic beverages for 24 hours.  ACTIVITY: Your care partner should take you home directly after the procedure.  You should plan to take it easy, moving slowly for the rest of the day.  You can resume normal activity the day after the procedure however you should NOT DRIVE or use heavy machinery for 24 hours (because of the sedation medicines used during the test).    SYMPTOMS TO REPORT IMMEDIATELY: A gastroenterologist can be reached at any hour.   During normal business hours, 8:30 AM to 5:00 PM Monday through Friday, call [phone].  After hours and on weekends, please call the GI answering service at [phone] who will take a message and have the physician on call contact you.   Following lower endoscopy (colonoscopy or flexible sigmoidoscopy):  Excessive amounts of blood in the stool  Significant tenderness or worsening of abdominal pains  Swelling of the abdomen that is new, acute  Fever of 100F or higher  Following upper endoscopy (EGD)  Vomiting of blood or coffee ground material  New chest pain or pain under the shoulder blades  Painful or persistently difficult swallowing  New shortness of breath  Fever of 100F or higher  Black, tarry-looking stools  FOLLOW UP: If any biopsies were taken you will be contacted by phone or by letter within the next 1-3 weeks.  Call your gastroenterologist if you have not heard about the biopsies in 3 weeks.  Our staff will call the home number listed on your records the next business day following your procedure to check on you and address any questions or concerns that you may have at that time regarding the information given to you following your procedure. This is a courtesy call and so if there is no answer at the home number and we have not heard from you through the emergency physician on call, we will assume that you have returned to your regular  daily activities without incident.  SIGNATURES/CONFIDENTIALITY: You and/or your care partner have signed paperwork which will be entered into your electronic medical record.  These signatures attest to the fact that that the information above on your After Visit Summary has been reviewed and is understood.  Full responsibility of the confidentiality of this discharge information lies with you and/or your care-partner.   Please follow all discharge instructions given to you by the recovery room nurse. If you have any questions or  problems after discharge please call one of the numbers listed above. You will receive a phone call in the am to see how you are doing and answer any questions you may have. Thank you for choosing [facility] for your health care needs.

## 2012-06-16 NOTE — Progress Notes (Signed)
Patient did not experience any of the following events: a burn prior to discharge; a fall within the facility; wrong site/side/patient/procedure/implant event; or a hospital transfer or hospital admission upon discharge from the facility. (G8907) Patient did not have preoperative order for IV antibiotic SSI prophylaxis. (G8918)  

## 2012-06-17 ENCOUNTER — Telehealth: Payer: Self-pay | Admitting: *Deleted

## 2012-06-17 NOTE — Telephone Encounter (Signed)
  Follow up Call-  Call back number 06/16/2012  Post procedure Call Back phone  # [phone]  Permission to leave phone message Yes     Patient questions:  Do you have a fever, pain , or abdominal swelling? no Pain Score  0 *  Have you tolerated food without any problems? yes  Have you been able to return to your normal activities? yes  Do you have any questions about your discharge instructions: Diet   no Medications  no Follow up visit  no  Do you have questions or concerns about your Care? no  Actions: * If pain score is 4 or above: No action needed, pain <4.

## 2012-06-21 ENCOUNTER — Encounter: Payer: Self-pay | Admitting: Internal Medicine

## 2012-07-19 ENCOUNTER — Other Ambulatory Visit: Payer: Self-pay | Admitting: Internal Medicine

## 2013-01-31 ENCOUNTER — Other Ambulatory Visit: Payer: Self-pay | Admitting: Internal Medicine

## 2013-02-19 ENCOUNTER — Other Ambulatory Visit: Payer: Self-pay | Admitting: Internal Medicine

## 2013-02-22 ENCOUNTER — Encounter: Payer: Self-pay | Admitting: *Deleted

## 2013-02-24 ENCOUNTER — Encounter: Payer: Self-pay | Admitting: Nurse Practitioner

## 2013-02-24 ENCOUNTER — Ambulatory Visit (INDEPENDENT_AMBULATORY_CARE_PROVIDER_SITE_OTHER): Payer: Managed Care, Other (non HMO) | Admitting: Nurse Practitioner

## 2013-02-24 VITALS — BP 130/76 | HR 84 | Ht 60.0 in | Wt 148.6 lb

## 2013-02-24 DIAGNOSIS — E559 Vitamin D deficiency, unspecified: Secondary | ICD-10-CM

## 2013-02-24 DIAGNOSIS — Z01419 Encounter for gynecological examination (general) (routine) without abnormal findings: Secondary | ICD-10-CM

## 2013-02-24 DIAGNOSIS — Z780 Asymptomatic menopausal state: Secondary | ICD-10-CM

## 2013-02-24 DIAGNOSIS — Z0000 Encounter for general adult medical examination without abnormal findings: Secondary | ICD-10-CM

## 2013-02-24 LAB — POCT URINALYSIS DIPSTICK
Spec Grav, UA: 1.015
pH, UA: 6

## 2013-02-24 MED ORDER — ESTRADIOL 0.025 MG/24HR TD PTTW: 1.0000 | MEDICATED_PATCH | TRANSDERMAL | Status: DC

## 2013-02-24 MED ORDER — ERGOCALCIFEROL 1.25 MG (50000 UT) PO CAPS: 50000.0000 [IU] | ORAL_CAPSULE | ORAL | Status: DC

## 2013-02-24 MED ORDER — ESTRADIOL 0.1 MG/GM VA CREA: 2.0000 g | TOPICAL_CREAM | VAGINAL | Status: DC

## 2013-02-24 NOTE — Patient Instructions (Signed)

## 2013-02-24 NOTE — Progress Notes (Signed)
67 y.o. G2P2 Married Caucasian Fe here for annual exam.  No new problems.  Still has sleep disorder and takes part of Ambien.  The Estrace vaginal cream is helping with dryness.  No LMP recorded. Patient has had a hysterectomy.          Sexually active: yes  The current method of family planning is status post hysterectomy.    Exercising: yes  Home exercise routine includes aerobics and weights . Smoker:  no  Health Maintenance: Pap:  2005 normal MMG:  03/24/2012 negative Colonoscopy:  2013 normal with mild diverticulosis, recheck in 10 years BMD:   02/2009 T score right hip was -0.7 TDaP:  02/2010 Labs: Hgb-14.2   reports that she has quit smoking. She has never used smokeless tobacco. She reports that she drinks about 4.2 ounces of alcohol per week. She reports that she does not use illicit drugs.  Past Medical History  Diagnosis Date  . Allergic rhinitis   . Vitamin D deficiency   . Blood transfusion     after tonsillectomy as child; and after hysterectomy  . GERD (gastroesophageal reflux disease)   . Hypothyroidism   . Diverticulosis 2003    Past Surgical History  Procedure Laterality Date  . Tonsillectomy    . Bunionectomy      hammertoe right '01, hammertoe left '06  . Blepharoplasty    . Abdominal hysterectomy  1988    TVH  . Tubal ligation      Current Outpatient Prescriptions  Medication Sig Dispense Refill  . Azelastine HCl (ASTEPRO) 0.15 % SOLN by Nasal route as needed.        . Biotin 5000 MCG CAPS Take by mouth daily.        . Calcium Carbonate-Vitamin D (CALTRATE 600+D) 600-400 MG-UNIT per tablet Take 1 tablet by mouth daily.        . Cetirizine HCl (ZYRTEC ALLERGY) 10 MG CAPS Take by mouth as needed.       . clotrimazole-betamethasone (LOTRISONE) cream Apply topically 2 (two) times daily.        . ergocalciferol (VITAMIN D2) 50000 UNITS capsule Take 50,000 Units by mouth. Takes every other week      . estradiol (VIVELLE-DOT) 0.025 MG/24HR Place 1 patch onto  the skin 2 (two) times a week.      . fluticasone (FLONASE) 50 MCG/ACT nasal spray 2 sprays by Nasal route daily.        Marland Kitchen levothyroxine (SYNTHROID, LEVOTHROID) 50 MCG tablet TAKE 1 TABLET BY MOUTH EVERY DAY  30 tablet  5  . methylcellulose packet Take by mouth daily.      . Multiple Vitamins-Minerals (CENTRUM SILVER PO) Take by mouth daily.        . Omega-3 Fatty Acids (FISH OIL) 1000 MG CAPS Take by mouth daily.        . predniSONE (DELTASONE) 10 MG tablet Take 1 tablet (10 mg total) by mouth daily. 3 tabs/day x 2 days, 2 tabs/day x 2 days, 1 tab daily x 4 days  14 tablet  0  . ranitidine (ZANTAC) 150 MG tablet Take 150 mg by mouth as needed.        . zolpidem (AMBIEN) 10 MG tablet Take 0.5 tablets (5 mg total) by mouth at bedtime as needed. 1/2 tablet at bedtime as needed  30 tablet  5   No current facility-administered medications for this visit.    Family History  Problem Relation Age of Onset  . Coronary artery  disease Mother   . Breast cancer Mother   . Thyroid disease Mother   . Hypertension Mother   . Osteoporosis Mother   . Lung cancer Father     age 49  . Diabetes Paternal Uncle   . Colon cancer Neg Hx   . Stomach cancer Neg Hx   . Asthma Brother   . Epilepsy Brother   . Diabetes Maternal Grandmother   . Multiple births Paternal Grandmother     ROS:  Pertinent items are noted in HPI.  Otherwise, a comprehensive ROS was negative.  Exam:   There were no vitals taken for this visit.       Ht Readings from Last 3 Encounters:  06/16/12 5' (1.524 m)  06/02/12 5' (1.524 m)  02/18/11 4\' 10"  (1.473 m)    General appearance: alert, cooperative and appears stated age Head: Normocephalic, without obvious abnormality, atraumatic Neck: no adenopathy, supple, symmetrical, trachea midline and thyroid normal to inspection and palpation Lungs: clear to auscultation bilaterally Breasts: normal appearance, no masses or tenderness Heart: regular rate and rhythm Abdomen: soft,  non-tender;  no masses,  no organomegaly Extremities: extremities normal, atraumatic, no cyanosis or edema Skin: Skin color, texture, turgor normal. No rashes or lesions Lymph nodes: Cervical, supraclavicular, and axillary nodes normal. No abnormal inguinal nodes palpated Neurologic: Grossly normal   Pelvic: External genitalia:  no lesions              Urethra:  normal appearing urethra with no masses, tenderness or lesions              Bartholin's and Skene's: normal                 Vagina: atrophic appearing vagina with pale color and no discharge, no lesions              Cervix: absent              Pap taken: no Bimanual Exam:  Uterus:  uterus absent              Adnexa: normal adnexa               Rectovaginal: Confirms               Anus:  normal sphincter tone, no lesions  A:  Well Woman with normal exam  S/P Vaginal Hysterectomy 1988 secondary to AUB and prolapse, on ERT  P:   Mammogram due 5/14  pap smear not indicated  Patient is counseled on potential side effects and risks of ERT( including heart, CVA, and cancer  risk) and wishes to continue  return annually or prn  An After Visit Summary was printed and given to the patient.

## 2013-02-28 NOTE — Progress Notes (Signed)
Encounter reviewed by Dr. Brook Silva.  

## 2013-03-08 ENCOUNTER — Other Ambulatory Visit: Payer: Self-pay

## 2013-03-08 MED ORDER — LEVOTHYROXINE SODIUM 50 MCG PO TABS: 50.0000 ug | ORAL_TABLET | Freq: Every day | ORAL | Status: DC

## 2013-03-15 ENCOUNTER — Encounter: Payer: Managed Care, Other (non HMO) | Admitting: Internal Medicine

## 2013-03-31 ENCOUNTER — Other Ambulatory Visit: Payer: Self-pay | Admitting: Nurse Practitioner

## 2013-04-04 ENCOUNTER — Ambulatory Visit (INDEPENDENT_AMBULATORY_CARE_PROVIDER_SITE_OTHER): Payer: Managed Care, Other (non HMO) | Admitting: Internal Medicine

## 2013-04-04 ENCOUNTER — Encounter: Payer: Self-pay | Admitting: Internal Medicine

## 2013-04-04 VITALS — BP 128/80 | HR 63 | Temp 97.4°F | Wt 146.8 lb

## 2013-04-04 DIAGNOSIS — Z0000 Encounter for general adult medical examination without abnormal findings: Secondary | ICD-10-CM

## 2013-04-04 DIAGNOSIS — L568 Other specified acute skin changes due to ultraviolet radiation: Secondary | ICD-10-CM

## 2013-04-04 DIAGNOSIS — J309 Allergic rhinitis, unspecified: Secondary | ICD-10-CM

## 2013-04-04 DIAGNOSIS — E039 Hypothyroidism, unspecified: Secondary | ICD-10-CM

## 2013-04-04 NOTE — Progress Notes (Signed)
Subjective:    Patient ID: Maria Montgomery, female    DOB: February 14, 1946, 67 y.o.   MRN: [id number]  HPI The patient is here for annual Medicare wellness examination and management of other chronic and acute problems.  She is well. She does have some leg pain but attributes this to being overly.   Rashes are doing very well with topical treatment.    The risk factors are reflected in the social history.  The roster of all physicians providing medical care to patient - is listed in the Snapshot section of the chart.  Activities of daily living:  The patient is 100% inedpendent in all ADLs: dressing, toileting, feeding as well as independent mobility  Home safety : The patient has smoke detectors in the home. They wear seatbelts.No firearms at home. There is no violence in the home.   There is no risks for hepatitis, STDs or HIV. There is no history of blood transfusion. They have no travel history to infectious disease endemic areas of the world.  The patient has seen their dentist in the last six month. They have seen their eye doctor in the last year. They deny any hearing difficulty and have not had audiologic testing in the last year.  They do not  have excessive sun exposure. Discussed the need for sun protection: hats, long sleeves and use of sunscreen if there is significant sun exposure.   Diet: the importance of a healthy diet is discussed. They do have a healthy diet.  The patient has a regular exercise program: aerobics/zumba , 1 hr duration, 3 per week.  The benefits of regular aerobic exercise were discussed.  Depression screen: there are no signs or vegative symptoms of depression- irritability, change in appetite, anhedonia, sadness/tearfullness.  Cognitive assessment: the patient manages all their financial and personal affairs and is actively engaged. .  The following portions of the patient's history were reviewed and updated as appropriate: allergies, current medications,  past family history, past medical history,  past surgical history, past social history  and problem list.  Vision, hearing, body mass index were assessed and reviewed.   During the course of the visit the patient was educated and counseled about appropriate screening and preventive services including : fall prevention , diabetes screening, nutrition counseling, colorectal cancer screening, and recommended immunizations.  Past Medical History  Diagnosis Date  . Allergic rhinitis   . Vitamin D deficiency   . Blood transfusion     after tonsillectomy as child; and after hysterectomy  . Diverticulosis 2003  . Hypothyroidism 12/2006   Past Surgical History  Procedure Laterality Date  . Tonsillectomy    . Bunionectomy      hammertoe right '01, hammertoe left '06  . Blepharoplasty    . Tubal ligation    . Colonoscopy  06/15/2012  . Vaginal hysterectomy  1988    had prolapse and blood transfusion   Family History  Problem Relation Age of Onset  . Coronary artery disease Mother   . Breast cancer Mother   . Thyroid disease Mother   . Hypertension Mother   . Osteoporosis Mother   . Lung cancer Father     age 48  . Diabetes Paternal Uncle   . Colon cancer Neg Hx   . Stomach cancer Neg Hx   . Asthma Brother   . Epilepsy Brother   . Diabetes Maternal Grandmother   . Multiple births Paternal Grandmother    History   Social History  .  Marital Status: Married    Spouse Name: N/A    Number of Children: N/A  . Years of Education: N/A   Occupational History  . Not on file.   Social History Main Topics  . Smoking status: Never Smoker   . Smokeless tobacco: Never Used  . Alcohol Use: 4.2 oz/week    7 Glasses of wine per week  . Drug Use: No  . Sexually Active: Yes    Birth Control/ Protection: Post-menopausal   Other Topics Concern  . Not on file   Social History Narrative   College grad- elementary education.  Married '67  Son-'69; Daughter- '71; 5 grandchildren. Very  active in the community. Marriage is in good health    Current Outpatient Prescriptions on File Prior to Visit  Medication Sig Dispense Refill  . Azelastine HCl (ASTEPRO) 0.15 % SOLN by Nasal route as needed.        Marland Kitchen BEPREVE 1.5 % SOLN Place 1.5 drops into both eyes as needed.      . Biotin 5000 MCG CAPS Take 20,000 mcg by mouth daily.       . Calcium Carbonate-Vitamin D (CALTRATE 600+D) 600-400 MG-UNIT per tablet Take 1 tablet by mouth daily.        . Cetirizine HCl (ZYRTEC ALLERGY) 10 MG CAPS Take by mouth as needed.       . clotrimazole-betamethasone (LOTRISONE) cream Apply topically as needed.       Marland Kitchen DYMISTA 137-50 MCG/ACT SUSP Take 137 mcg by mouth as needed.       Marland Kitchen estradiol (ESTRACE) 0.1 MG/GM vaginal cream Place 0.25 Applicatorfuls vaginally 2 (two) times a week.  42.5 g  3  . estradiol (VIVELLE-DOT) 0.025 MG/24HR Place 1 patch onto the skin 2 (two) times a week.  24 patch  3  . fluticasone (FLONASE) 50 MCG/ACT nasal spray 2 sprays by Nasal route daily.        Marland Kitchen levothyroxine (SYNTHROID, LEVOTHROID) 50 MCG tablet Take 1 tablet (50 mcg total) by mouth daily.  30 tablet  0  . Multiple Vitamins-Minerals (CENTRUM SILVER PO) Take by mouth daily.        . predniSONE (DELTASONE) 10 MG tablet Take 1 tablet (10 mg total) by mouth daily. 3 tabs/day x 2 days, 2 tabs/day x 2 days, 1 tab daily x 4 days  14 tablet  0  . ranitidine (ZANTAC) 150 MG tablet Take 150 mg by mouth as needed.        . zolpidem (AMBIEN) 10 MG tablet Take 0.5 tablets (5 mg total) by mouth at bedtime as needed. 1/2 tablet at bedtime as needed  30 tablet  5   No current facility-administered medications on file prior to visit.           Review of Systems Constitutional:  Negative for fever, chills, activity change and unexpected weight change.  HEENT:  Negative for hearing loss, ear pain, congestion, neck stiffness and postnasal drip. Negative for sore throat or swallowing problems. Negative for dental complaints.    Eyes: Negative for vision loss or change in visual acuity.  Respiratory: Negative for chest tightness and wheezing. Negative for DOE.   Cardiovascular: Negative for chest pain or palpitations. No decreased exercise tolerance Gastrointestinal: No change in bowel habit. No bloating or gas. No reflux or indigestion Genitourinary: Negative for urgency, frequency, flank pain and difficulty urinating.  Musculoskeletal: Negative for myalgias, back pain, arthralgias and gait problem.  Neurological: Negative for dizziness, tremors, weakness and headaches.  Hematological: Negative for adenopathy.  Psychiatric/Behavioral: Negative for behavioral problems and dysphoric mood.       Objective:   Physical Exam Filed Vitals:   04/04/13 0958  BP: 128/80  Pulse: 63  Temp: 97.4 F (36.3 C)   Wt Readings from Last 3 Encounters:  04/04/13 146 lb 12.8 oz (66.588 kg)  02/24/13 148 lb 9.6 oz (67.405 kg)  06/16/12 145 lb (65.772 kg)   Gen'l: well nourished, well developed white Woman in no distress HEENT - [place]/AT, EACs/TMs normal, oropharynx with native dentition in good condition, no buccal or palatal lesions, posterior pharynx clear, mucous membranes moist. C&S clear, PERRLA, fundi - normal Neck - supple, no thyromegaly Nodes- negative submental, cervical, supraclavicular regions Chest - no deformity, no CVAT Lungs - clear without rales, wheezes. No increased work of breathing Breast - exam deferred to gyn and mammography Cardiovascular - regular rate and rhythm, quiet precordium, no murmurs, rubs or gallops, 2+ radial, DP and PT pulses Abdomen - BS+ x 4, no HSM, no guarding or rebound or tenderness Pelvic - deferred to gyn Rectal - deferred to gyn Extremities - no clubbing, cyanosis, edema or deformity.  Neuro - A&O x 3, CN II-XII normal, motor strength normal and equal, DTRs 2+ and symmetrical biceps, radial, and patellar tendons. Cerebellar - no tremor, no rigidity, fluid movement and normal  gait. Derm - Head, neck, back, abdomen and extremities without suspicious lesions  Recent Results (from the past 2160 hour(s))  HEMOGLOBIN, FINGERSTICK     Status: None   Collection Time    02/24/13  9:51 AM      Result Value Range   Hemoglobin, fingerstick 14.2  12.0 - 16.0 g/dL  POCT URINALYSIS DIPSTICK     Status: None   Collection Time    02/24/13  2:57 PM      Result Value Range   Color, UA yellow     Clarity, UA clear     Glucose, UA -     Bilirubin, UA -     Ketones, UA -     Spec Grav, UA 1.015     Blood, UA -     pH, UA 6.0     Protein, UA -     Urobilinogen, UA negative     Nitrite, UA -     Leukocytes, UA Negative    VITAMIN D 25 HYDROXY     Status: None   Collection Time    02/24/13  3:55 PM      Result Value Range   Vit D, 25-Hydroxy 60  30 - 89 ng/mL   Comment: This assay accurately quantifies Vitamin D, which is the sum of the     25-Hydroxy forms of Vitamin D2 and D3.  Studies have shown that the     optimum concentration of 25-Hydroxy Vitamin D is 30 ng/mL or higher.      Concentrations of Vitamin D between 20 and 29 ng/mL are considered to     be insufficient and concentrations less than 20 ng/mL are considered     to be deficient for Vitamin D.         Assessment & Plan:

## 2013-04-04 NOTE — Patient Instructions (Addendum)
Thanks for coming in. Things look fine. Lab is ordered for today: basic metabolic panel and thyroid labs. Results via MyChart.

## 2013-04-04 NOTE — Assessment & Plan Note (Signed)
Stable and doing well w/o active rash at this time. She has had a pretty good year. She does take protective measures to protect her skin.

## 2013-04-04 NOTE — Assessment & Plan Note (Signed)
Stable and doing well w/o serious symptoms at this time

## 2013-04-04 NOTE — Assessment & Plan Note (Signed)
Tolerating dose of  Levothyroxine. No symptoms of either hypo or hyperthyroidism.  Plan Labs: TSH, FT4 with recommendations to follow if labs are abnormal.

## 2013-04-04 NOTE — Assessment & Plan Note (Signed)
Interval history without major illness, injury or any surgery. She may have visual changes consistent with early cataracts and we discussed what changes in her vision she should watch for as a trigger for ophthalmologic intervention. Physical exam, sans breast and pelvic,is normal. Prior labs reviewed: normal cholesterol levels in '12 with repeat panel due in 2015. Labs from gyn office are normal. Thyroid labs are pending. She is current with colorectal cancer screening and she is due for mammorgraphy. Immunizations are up to date.  In summary - a delightful woman who appears to be medically stable. She will return in 1 year or sooner as needed.

## 2013-04-05 ENCOUNTER — Other Ambulatory Visit (INDEPENDENT_AMBULATORY_CARE_PROVIDER_SITE_OTHER): Payer: Managed Care, Other (non HMO)

## 2013-04-05 DIAGNOSIS — Z0000 Encounter for general adult medical examination without abnormal findings: Secondary | ICD-10-CM

## 2013-04-05 DIAGNOSIS — E039 Hypothyroidism, unspecified: Secondary | ICD-10-CM

## 2013-04-05 LAB — COMPREHENSIVE METABOLIC PANEL
ALT: 17 U/L (ref 0–35)
BUN: 17 mg/dL (ref 6–23)
CO2: 26 mEq/L (ref 19–32)
Calcium: 9 mg/dL (ref 8.4–10.5)
Chloride: 108 mEq/L (ref 96–112)
Creatinine, Ser: 0.8 mg/dL (ref 0.4–1.2)
GFR: 71.41 mL/min (ref >60.00)
Glucose, Bld: 88 mg/dL (ref 70–99)

## 2013-04-05 LAB — T4, FREE: Free T4: 0.79 ng/dL (ref 0.60–1.60)

## 2013-04-27 ENCOUNTER — Encounter: Payer: Self-pay | Admitting: Internal Medicine

## 2013-04-27 ENCOUNTER — Other Ambulatory Visit: Payer: Self-pay | Admitting: Nurse Practitioner

## 2013-06-08 ENCOUNTER — Other Ambulatory Visit: Payer: Self-pay

## 2013-07-20 ENCOUNTER — Other Ambulatory Visit: Payer: Self-pay | Admitting: Obstetrics and Gynecology

## 2013-07-20 NOTE — Telephone Encounter (Signed)
Please advise patient was seen 02/24/13 for AEX no rx for Ambien 10 mg was sent.

## 2013-09-04 ENCOUNTER — Other Ambulatory Visit: Payer: Self-pay | Admitting: Internal Medicine

## 2013-09-08 ENCOUNTER — Other Ambulatory Visit: Payer: Self-pay

## 2013-11-09 ENCOUNTER — Other Ambulatory Visit: Payer: Self-pay | Admitting: Internal Medicine

## 2013-11-17 ENCOUNTER — Other Ambulatory Visit: Payer: Self-pay | Admitting: Dermatology

## 2014-01-16 DIAGNOSIS — J309 Allergic rhinitis, unspecified: Secondary | ICD-10-CM | POA: Diagnosis not present

## 2014-01-25 ENCOUNTER — Other Ambulatory Visit: Payer: Self-pay | Admitting: Nurse Practitioner

## 2014-01-25 NOTE — Telephone Encounter (Signed)
Last AEX 02/24/2013 Last refill 07/25/2013 #30/2 refills Next appt 02/27/2014   Please approve or deny Rx.

## 2014-01-26 NOTE — Telephone Encounter (Addendum)
Rx called into [facility]

## 2014-01-30 DIAGNOSIS — J309 Allergic rhinitis, unspecified: Secondary | ICD-10-CM | POA: Diagnosis not present

## 2014-02-13 DIAGNOSIS — J309 Allergic rhinitis, unspecified: Secondary | ICD-10-CM | POA: Diagnosis not present

## 2014-02-16 DIAGNOSIS — J309 Allergic rhinitis, unspecified: Secondary | ICD-10-CM | POA: Diagnosis not present

## 2014-02-21 DIAGNOSIS — J309 Allergic rhinitis, unspecified: Secondary | ICD-10-CM | POA: Diagnosis not present

## 2014-02-23 ENCOUNTER — Other Ambulatory Visit: Payer: Self-pay

## 2014-02-23 MED ORDER — LEVOTHYROXINE SODIUM 50 MCG PO TABS: 50.0000 ug | ORAL_TABLET | Freq: Every day | ORAL | Status: DC

## 2014-02-27 ENCOUNTER — Ambulatory Visit: Payer: Managed Care, Other (non HMO) | Admitting: Nurse Practitioner

## 2014-02-28 DIAGNOSIS — J309 Allergic rhinitis, unspecified: Secondary | ICD-10-CM | POA: Diagnosis not present

## 2014-03-06 DIAGNOSIS — J309 Allergic rhinitis, unspecified: Secondary | ICD-10-CM | POA: Diagnosis not present

## 2014-03-14 DIAGNOSIS — J309 Allergic rhinitis, unspecified: Secondary | ICD-10-CM | POA: Diagnosis not present

## 2014-03-15 DIAGNOSIS — L570 Actinic keratosis: Secondary | ICD-10-CM | POA: Diagnosis not present

## 2014-03-29 DIAGNOSIS — J309 Allergic rhinitis, unspecified: Secondary | ICD-10-CM | POA: Diagnosis not present

## 2014-03-30 ENCOUNTER — Other Ambulatory Visit: Payer: Self-pay | Admitting: Internal Medicine

## 2014-04-06 ENCOUNTER — Encounter: Payer: Managed Care, Other (non HMO) | Admitting: Physician Assistant

## 2014-04-10 ENCOUNTER — Encounter: Payer: Self-pay | Admitting: Nurse Practitioner

## 2014-04-10 ENCOUNTER — Ambulatory Visit (INDEPENDENT_AMBULATORY_CARE_PROVIDER_SITE_OTHER): Payer: Medicare Other | Admitting: Nurse Practitioner

## 2014-04-10 VITALS — BP 122/74 | HR 72 | Resp 16 | Ht 60.0 in | Wt 146.0 lb

## 2014-04-10 DIAGNOSIS — Z1272 Encounter for screening for malignant neoplasm of vagina: Secondary | ICD-10-CM | POA: Diagnosis not present

## 2014-04-10 DIAGNOSIS — Z124 Encounter for screening for malignant neoplasm of cervix: Secondary | ICD-10-CM | POA: Diagnosis not present

## 2014-04-10 DIAGNOSIS — Z01419 Encounter for gynecological examination (general) (routine) without abnormal findings: Secondary | ICD-10-CM | POA: Diagnosis not present

## 2014-04-10 DIAGNOSIS — Z803 Family history of malignant neoplasm of breast: Secondary | ICD-10-CM | POA: Diagnosis not present

## 2014-04-10 DIAGNOSIS — Z1231 Encounter for screening mammogram for malignant neoplasm of breast: Secondary | ICD-10-CM | POA: Diagnosis not present

## 2014-04-10 DIAGNOSIS — Z0000 Encounter for general adult medical examination without abnormal findings: Secondary | ICD-10-CM

## 2014-04-10 DIAGNOSIS — Z79890 Hormone replacement therapy (postmenopausal): Secondary | ICD-10-CM

## 2014-04-10 LAB — HM MAMMOGRAPHY: HM MAMMO: NORMAL

## 2014-04-10 LAB — POCT URINALYSIS DIPSTICK
Bilirubin, UA: NEGATIVE
Glucose, UA: NEGATIVE
KETONES UA: NEGATIVE
LEUKOCYTES UA: NEGATIVE
Nitrite, UA: NEGATIVE
PROTEIN UA: NEGATIVE
RBC UA: NEGATIVE
Urobilinogen, UA: NEGATIVE
pH, UA: 5

## 2014-04-10 LAB — HM PAP SMEAR

## 2014-04-10 MED ORDER — ESTRADIOL 0.1 MG/GM VA CREA: TOPICAL_CREAM | VAGINAL | Status: DC

## 2014-04-10 MED ORDER — ESTRADIOL 0.025 MG/24HR TD PTTW: 1.0000 | MEDICATED_PATCH | TRANSDERMAL | Status: DC

## 2014-04-10 MED ORDER — ZOLPIDEM TARTRATE 10 MG PO TABS: ORAL_TABLET | ORAL | Status: DC

## 2014-04-10 NOTE — Patient Instructions (Signed)

## 2014-04-10 NOTE — Progress Notes (Signed)
68 y.o. G57P2002 Married Caucasian Fe here for annual exam.  Will see new PCP this week.  Doing well.  Husband is having issues with RSD following a stress fracture of his foot.  Patient's last menstrual period was 07/28/1987.          Sexually active: yes  The current method of family planning is post menopausal status, hysterectomy.    Exercising: yes  zumba, aerobics, walking. Smoker:  no  Health Maintenance: Pap:  Will repeat today MMG:  04/2013 BRADS2 scheduled today Colonoscopy:  06/2013 - Normal. Every 10 years  BMD:   2013 TDaP:  2011 Shingles: age 57 Pneumovax: age 7 Labs: PCP   reports that she has never smoked. She has never used smokeless tobacco. She reports that she drinks about 4.2 ounces of alcohol per week. She reports that she does not use illicit drugs.  Past Medical History  Diagnosis Date  . Allergic rhinitis   . Vitamin D deficiency   . Blood transfusion     after tonsillectomy as child; and after hysterectomy  . Diverticulosis 2003  . Hypothyroidism 12/2006    Past Surgical History  Procedure Laterality Date  . Tonsillectomy    . Bunionectomy      hammertoe right '01, hammertoe left '06  . Blepharoplasty    . Tubal ligation    . Colonoscopy  06/15/2012  . Vaginal hysterectomy  1988    had prolapse and blood transfusion    Current Outpatient Prescriptions  Medication Sig Dispense Refill  . Azelastine HCl (ASTEPRO) 0.15 % SOLN by Nasal route as needed.        Marland Kitchen BEPREVE 1.5 % SOLN Place 1.5 drops into both eyes as needed.      . Biotin 5000 MCG CAPS Take 20,000 mcg by mouth daily.       . Cetirizine HCl (ZYRTEC ALLERGY) 10 MG CAPS Take by mouth as needed.       . cholecalciferol (VITAMIN D) 1000 UNITS tablet Take 1,000 Units by mouth daily.      Marland Kitchen DYMISTA 137-50 MCG/ACT SUSP Take 137 mcg by mouth as needed.       Marland Kitchen estradiol (ESTRACE) 0.1 MG/GM vaginal cream 1/2 gm applicator vaginal twice weekly  42.5 g  3  . estradiol (VIVELLE-DOT) 0.025 MG/24HR  Place 1 patch onto the skin 2 (two) times a week.  24 patch  3  . levothyroxine (SYNTHROID, LEVOTHROID) 50 MCG tablet Take 50 mcg by mouth daily before breakfast.      . Multiple Vitamins-Minerals (CENTRUM SILVER PO) Take by mouth daily.        Marland Kitchen zolpidem (AMBIEN) 10 MG tablet TAKE 1 TABLET BY MOUTH AT BEDTIME AS NEEDED  30 tablet  1  . Calcium Carbonate-Vitamin D (CALTRATE 600+D) 600-400 MG-UNIT per tablet Take 1 tablet by mouth daily.        . fluticasone (FLONASE) 50 MCG/ACT nasal spray 2 sprays by Nasal route daily.        . ranitidine (ZANTAC) 150 MG tablet Take 150 mg by mouth as needed.         No current facility-administered medications for this visit.    Family History  Problem Relation Age of Onset  . Coronary artery disease Mother   . Breast cancer Mother   . Thyroid disease Mother   . Hypertension Mother   . Osteoporosis Mother   . Lung cancer Father     age 51  . Diabetes Paternal Uncle   .  Colon cancer Neg Hx   . Stomach cancer Neg Hx   . Asthma Brother   . Epilepsy Brother   . Diabetes Maternal Grandmother   . Multiple births Paternal Grandmother     ROS:  Pertinent items are noted in HPI.  Otherwise, a comprehensive ROS was negative.  Exam:   BP 122/74  Pulse 72  Resp 16  Ht 5' (1.524 m)  Wt 146 lb (66.225 kg)  BMI 28.51 kg/m2  LMP 07/28/1987 Height: 5' (152.4 cm)  Ht Readings from Last 3 Encounters:  04/10/14 5' (1.524 m)  02/24/13 5' (1.524 m)  06/16/12 5' (1.524 m)    General appearance: alert, cooperative and appears stated age Head: Normocephalic, without obvious abnormality, atraumatic Neck: no adenopathy, supple, symmetrical, trachea midline and thyroid normal to inspection and palpation Lungs: clear to auscultation bilaterally Breasts: normal appearance, no masses or tenderness Heart: regular rate and rhythm Abdomen: soft, non-tender; no masses,  no organomegaly Extremities: extremities normal, atraumatic, no cyanosis or edema Skin: Skin  color, texture, turgor normal. No rashes or lesions Lymph nodes: Cervical, supraclavicular, and axillary nodes normal. No abnormal inguinal nodes palpated Neurologic: Grossly normal   Pelvic: External genitalia:  no lesions              Urethra:  normal appearing urethra with no masses, tenderness or lesions              Bartholin's and Skene's: normal                 Vagina: normal appearing vagina with normal color and discharge, no lesions              Cervix: absent              Pap taken: yes Bimanual Exam:  Uterus:  uterus absent              Adnexa: no mass, fullness, tenderness               Rectovaginal: Confirms               Anus:  normal sphincter tone, no lesions  A:  Well Woman with normal exam  S/P TVH secondary to prolapse 1988  Postmenopausal on ERT since 1993  History of insomnia  P:   Reviewed health and wellness pertinent to exam  Pap smear taken today  Mammogram is being done today  Refill Ambien 10 mg to use only prn - only takes 1/2 -3/4 tablet prn  Refill on Vivelle dot for a year along with Estrace Vaginal cream - she is going to taper off Vivelle patch during the summer into fall and hopefully off med's by next year.  Her symptoms of insomnia with night sweats may determine when she can taper.  Counseled with risk of DVT, CVA, cancer, etc  Counseled on breast self exam, mammography screening, use and side effects of HRT, adequate intake of calcium and vitamin D, diet and exercise return annually or prn  ROI for BMD  An After Visit Summary was printed and given to the patient.

## 2014-04-12 ENCOUNTER — Encounter: Payer: Self-pay | Admitting: Internal Medicine

## 2014-04-12 ENCOUNTER — Ambulatory Visit (INDEPENDENT_AMBULATORY_CARE_PROVIDER_SITE_OTHER): Payer: Medicare Other | Admitting: Internal Medicine

## 2014-04-12 ENCOUNTER — Other Ambulatory Visit (INDEPENDENT_AMBULATORY_CARE_PROVIDER_SITE_OTHER): Payer: Medicare Other

## 2014-04-12 VITALS — BP 118/70 | HR 65 | Temp 97.7°F | Resp 16 | Ht 60.0 in | Wt 144.0 lb

## 2014-04-12 DIAGNOSIS — E663 Overweight: Secondary | ICD-10-CM

## 2014-04-12 DIAGNOSIS — E039 Hypothyroidism, unspecified: Secondary | ICD-10-CM

## 2014-04-12 DIAGNOSIS — J309 Allergic rhinitis, unspecified: Secondary | ICD-10-CM | POA: Diagnosis not present

## 2014-04-12 DIAGNOSIS — Z0000 Encounter for general adult medical examination without abnormal findings: Secondary | ICD-10-CM

## 2014-04-12 DIAGNOSIS — Z23 Encounter for immunization: Secondary | ICD-10-CM

## 2014-04-12 LAB — COMPREHENSIVE METABOLIC PANEL
ALT: 23 U/L (ref 0–35)
AST: 22 U/L (ref 0–37)
Albumin: 4.1 g/dL (ref 3.5–5.2)
Alkaline Phosphatase: 48 U/L (ref 39–117)
BUN: 18 mg/dL (ref 6–23)
CALCIUM: 9.6 mg/dL (ref 8.4–10.5)
CHLORIDE: 104 meq/L (ref 96–112)
CO2: 26 meq/L (ref 19–32)
Creatinine, Ser: 0.8 mg/dL (ref 0.4–1.2)
GFR: 73.2 mL/min (ref >60.00)
Glucose, Bld: 100 mg/dL — ABNORMAL HIGH (ref 70–99)
Potassium: 4 mEq/L (ref 3.5–5.1)
Sodium: 140 mEq/L (ref 135–145)
Total Bilirubin: 0.7 mg/dL (ref 0.2–1.2)
Total Protein: 6.8 g/dL (ref 6.0–8.3)

## 2014-04-12 LAB — CBC WITH DIFFERENTIAL/PLATELET
BASOS ABS: 0 10*3/uL (ref 0.0–0.1)
Basophils Relative: 0.6 % (ref 0.0–3.0)
Eosinophils Absolute: 0.1 10*3/uL (ref 0.0–0.7)
Eosinophils Relative: 1 % (ref 0.0–5.0)
HCT: 42.1 % (ref 36.0–46.0)
Hemoglobin: 14.1 g/dL (ref 12.0–15.0)
LYMPHS PCT: 31.9 % (ref 12.0–46.0)
Lymphs Abs: 2.1 10*3/uL (ref 0.7–4.0)
MCHC: 33.3 g/dL (ref 30.0–36.0)
MCV: 92.1 fl (ref 78.0–100.0)
Monocytes Absolute: 0.9 10*3/uL (ref 0.1–1.0)
Monocytes Relative: 13.3 % — ABNORMAL HIGH (ref 3.0–12.0)
NEUTROS PCT: 53.2 % (ref 43.0–77.0)
Neutro Abs: 3.5 10*3/uL (ref 1.4–7.7)
Platelets: 268 10*3/uL (ref 150.0–400.0)
RBC: 4.58 Mil/uL (ref 3.87–5.11)
RDW: 13.3 % (ref 11.5–15.5)
WBC: 6.6 10*3/uL (ref 4.0–10.5)

## 2014-04-12 LAB — TSH: TSH: 2.14 u[IU]/mL (ref 0.35–4.50)

## 2014-04-12 LAB — LIPID PANEL
Cholesterol: 182 mg/dL (ref 0–200)
HDL: 79.1 mg/dL (ref >39.00)
LDL Cholesterol: 91 mg/dL (ref 0–99)
NonHDL: 102.9
Total CHOL/HDL Ratio: 2
Triglycerides: 59 mg/dL (ref 0.0–149.0)
VLDL: 11.8 mg/dL (ref 0.0–40.0)

## 2014-04-12 LAB — IPS PAP SMEAR ONLY

## 2014-04-12 MED ORDER — LEVOTHYROXINE SODIUM 50 MCG PO TABS: 50.0000 ug | ORAL_TABLET | Freq: Every day | ORAL | Status: DC

## 2014-04-12 NOTE — Assessment & Plan Note (Signed)
Her TSH is in the normal range so she will stay on the current dose of synthroid

## 2014-04-12 NOTE — Progress Notes (Signed)
   Subjective:    Patient ID: Maria Montgomery, female    DOB: 1946/10/08, 68 y.o.   MRN: [id number]  Thyroid Problem Presents for follow-up visit. Patient reports no anxiety, cold intolerance, constipation, depressed mood, diaphoresis, diarrhea, dry skin, fatigue, hair loss, heat intolerance, hoarse voice, leg swelling, menstrual problem, nail problem, palpitations, tremors, visual change, weight gain or weight loss. The symptoms have been stable. Past treatments include levothyroxine. The treatment provided significant relief.      Review of Systems  Constitutional: Negative.  Negative for fever, chills, weight loss, weight gain, diaphoresis, appetite change and fatigue.  HENT: Negative.  Negative for hoarse voice.   Eyes: Negative.   Respiratory: Negative.  Negative for cough, choking, chest tightness, shortness of breath and stridor.   Cardiovascular: Negative.  Negative for chest pain, palpitations and leg swelling.  Gastrointestinal: Negative.  Negative for nausea, vomiting, abdominal pain, diarrhea and constipation.  Endocrine: Negative.  Negative for cold intolerance and heat intolerance.  Genitourinary: Negative.  Negative for menstrual problem.  Musculoskeletal: Negative.  Negative for arthralgias, back pain, myalgias and neck pain.  Skin: Negative.  Negative for rash.  Allergic/Immunologic: Negative.   Neurological: Negative.  Negative for dizziness, tremors, weakness, light-headedness, numbness and headaches.  Hematological: Negative.  Negative for adenopathy. Does not bruise/bleed easily.  Psychiatric/Behavioral: Negative.        Objective:   Physical Exam  Vitals reviewed. Constitutional: She is oriented to person, place, and time. She appears well-developed and well-nourished. No distress.  HENT:  Head: Normocephalic and atraumatic.  Mouth/Throat: Oropharynx is clear and moist. No oropharyngeal exudate.  Eyes: Conjunctivae are normal. Right eye exhibits no discharge.  Left eye exhibits no discharge. No scleral icterus.  Neck: Normal range of motion. Neck supple. No JVD present. No tracheal deviation present. No thyromegaly present.  Cardiovascular: Normal rate, regular rhythm, normal heart sounds and intact distal pulses.  Exam reveals no gallop and no friction rub.   No murmur heard. Pulmonary/Chest: Effort normal and breath sounds normal. No stridor. No respiratory distress. She has no wheezes. She has no rales. She exhibits no tenderness.  Abdominal: Soft. Bowel sounds are normal. She exhibits no distension and no mass. There is no tenderness. There is no rebound and no guarding.  Musculoskeletal: Normal range of motion. She exhibits no edema and no tenderness.  Lymphadenopathy:    She has no cervical adenopathy.  Neurological: She is oriented to person, place, and time.  Skin: Skin is warm and dry. No rash noted. She is not diaphoretic. No erythema. No pallor.  Psychiatric: She has a normal mood and affect. Her behavior is normal. Judgment and thought content normal.     Lab Results  Component Value Date   WBC 7.4 02/13/2011   HGB 14.3 02/13/2011   HCT 41.7 02/13/2011   PLT 303.0 02/13/2011   GLUCOSE 88 04/05/2013   CHOL 190 02/13/2011   TRIG 83.0 02/13/2011   HDL 70.30 02/13/2011   LDLDIRECT 97.6 02/05/2009   LDLCALC 103* 02/13/2011   ALT 17 04/05/2013   AST 19 04/05/2013   NA 140 04/05/2013   K 4.3 04/05/2013   CL 108 04/05/2013   CREATININE 0.8 04/05/2013   BUN 17 04/05/2013   CO2 26 04/05/2013   TSH 2.55 04/05/2013       Assessment & Plan:

## 2014-04-12 NOTE — Patient Instructions (Signed)
Preventive Care for Adults, Female A healthy lifestyle and preventive care can promote health and wellness. Preventive health guidelines for women include the following key practices.  A routine yearly physical is a good way to check with your health care provider about your health and preventive screening. It is a chance to share any concerns and updates on your health and to receive a thorough exam.  Visit your dentist for a routine exam and preventive care every 6 months. Brush your teeth twice a day and floss once a day. Good oral hygiene prevents tooth decay and gum disease.  The frequency of eye exams is based on your age, health, family medical history, use of contact lenses, and other factors. Follow your health care provider's recommendations for frequency of eye exams.  Eat a healthy diet. Foods like vegetables, fruits, whole grains, low-fat dairy products, and lean protein foods contain the nutrients you need without too many calories. Decrease your intake of foods high in solid fats, added sugars, and salt. Eat the right amount of calories for you.Get information about a proper diet from your health care provider, if necessary.  Regular physical exercise is one of the most important things you can do for your health. Most adults should get at least 150 minutes of moderate-intensity exercise (any activity that increases your heart rate and causes you to sweat) each week. In addition, most adults need muscle-strengthening exercises on 2 or more days a week.  Maintain a healthy weight. The body mass index (BMI) is a screening tool to identify possible weight problems. It provides an estimate of body fat based on height and weight. Your health care provider can find your BMI, and can help you achieve or maintain a healthy weight.For adults 20 years and older:  A BMI below 18.5 is considered underweight.  A BMI of 18.5 to 24.9 is normal.  A BMI of 25 to 29.9 is considered overweight.  A  BMI of 30 and above is considered obese.  Maintain normal blood lipids and cholesterol levels by exercising and minimizing your intake of saturated fat. Eat a balanced diet with plenty of fruit and vegetables. Blood tests for lipids and cholesterol should begin at age 62 and be repeated every 5 years. If your lipid or cholesterol levels are high, you are over 50, or you are at high risk for heart disease, you may need your cholesterol levels checked more frequently.Ongoing high lipid and cholesterol levels should be treated with medicines if diet and exercise are not working.  If you smoke, find out from your health care provider how to quit. If you do not use tobacco, do not start.  Lung cancer screening is recommended for adults aged 36 80 years who are at high risk for developing lung cancer because of a history of smoking. A yearly low-dose CT scan of the lungs is recommended for people who have at least a 30-pack-year history of smoking and are a current smoker or have quit within the past 15 years. A pack year of smoking is smoking an average of 1 pack of cigarettes a day for 1 year (for example: 1 pack a day for 30 years or 2 packs a day for 15 years). Yearly screening should continue until the smoker has stopped smoking for at least 15 years. Yearly screening should be stopped for people who develop a health problem that would prevent them from having lung cancer treatment.  If you are pregnant, do not drink alcohol. If you  are breastfeeding, be very cautious about drinking alcohol. If you are not pregnant and choose to drink alcohol, do not have more than 1 drink per day. One drink is considered to be 12 ounces (355 mL) of beer, 5 ounces (148 mL) of wine, or 1.5 ounces (44 mL) of liquor.  Avoid use of street drugs. Do not share needles with anyone. Ask for help if you need support or instructions about stopping the use of drugs.  High blood pressure causes heart disease and increases the risk  of stroke. Your blood pressure should be checked at least every 1 to 2 years. Ongoing high blood pressure should be treated with medicines if weight loss and exercise do not work.  If you are 39 70 years old, ask your health care provider if you should take aspirin to prevent strokes.  Diabetes screening involves taking a blood sample to check your fasting blood sugar level. This should be done once every 3 years, after age 56, if you are within normal weight and without risk factors for diabetes. Testing should be considered at a younger age or be carried out more frequently if you are overweight and have at least 1 risk factor for diabetes.  Breast cancer screening is essential preventive care for women. You should practice "breast self-awareness." This means understanding the normal appearance and feel of your breasts and may include breast self-examination. Any changes detected, no matter how small, should be reported to a health care provider. Women in their 40s and 30s should have a clinical breast exam (CBE) by a health care provider as part of a regular health exam every 1 to 3 years. After age 28, women should have a CBE every year. Starting at age 72, women should consider having a mammogram (breast X-ray test) every year. Women who have a family history of breast cancer should talk to their health care provider about genetic screening. Women at a high risk of breast cancer should talk to their health care providers about having an MRI and a mammogram every year.  Breast cancer gene (BRCA)-related cancer risk assessment is recommended for women who have family members with BRCA-related cancers. BRCA-related cancers include breast, ovarian, tubal, and peritoneal cancers. Having family members with these cancers may be associated with an increased risk for harmful changes (mutations) in the breast cancer genes BRCA1 and BRCA2. Results of the assessment will determine the need for genetic counseling  and BRCA1 and BRCA2 testing.  The Pap test is a screening test for cervical cancer. A Pap test can show cell changes on the cervix that might become cervical cancer if left untreated. A Pap test is a procedure in which cells are obtained and examined from the lower end of the uterus (cervix).  Women should have a Pap test starting at age 59.  Between ages 42 and 13, Pap tests should be repeated every 2 years.  Beginning at age 53, you should have a Pap test every 3 years as long as the past 3 Pap tests have been normal.  Some women have medical problems that increase the chance of getting cervical cancer. Talk to your health care provider about these problems. It is especially important to talk to your health care provider if a new problem develops soon after your last Pap test. In these cases, your health care provider may recommend more frequent screening and Pap tests.  The above recommendations are the same for women who have or have not gotten the vaccine  for human papillomavirus (HPV).  If you had a hysterectomy for a problem that was not cancer or a condition that could lead to cancer, then you no longer need Pap tests. Even if you no longer need a Pap test, a regular exam is a good idea to make sure no other problems are starting.  If you are between ages 58 and 10 years, and you have had normal Pap tests going back 10 years, you no longer need Pap tests. Even if you no longer need a Pap test, a regular exam is a good idea to make sure no other problems are starting.  If you have had past treatment for cervical cancer or a condition that could lead to cancer, you need Pap tests and screening for cancer for at least 20 years after your treatment.  If Pap tests have been discontinued, risk factors (such as a new sexual partner) need to be reassessed to determine if screening should be resumed.  The HPV test is an additional test that may be used for cervical cancer screening. The HPV test  looks for the virus that can cause the cell changes on the cervix. The cells collected during the Pap test can be tested for HPV. The HPV test could be used to screen women aged 67 years and older, and should be used in women of any age who have unclear Pap test results. After the age of 65, women should have HPV testing at the same frequency as a Pap test.  Colorectal cancer can be detected and often prevented. Most routine colorectal cancer screening begins at the age of 25 years and continues through age 66 years. However, your health care provider may recommend screening at an earlier age if you have risk factors for colon cancer. On a yearly basis, your health care provider may provide home test kits to check for hidden blood in the stool. Use of a small camera at the end of a tube, to directly examine the colon (sigmoidoscopy or colonoscopy), can detect the earliest forms of colorectal cancer. Talk to your health care provider about this at age 79, when routine screening begins. Direct exam of the colon should be repeated every 5 10 years through age 47 years, unless early forms of pre-cancerous polyps or small growths are found.  People who are at an increased risk for hepatitis B should be screened for this virus. You are considered at high risk for hepatitis B if:  You were born in a country where hepatitis B occurs often. Talk with your health care provider about which countries are considered high risk.  Your parents were born in a high-risk country and you have not received a shot to protect against hepatitis B (hepatitis B vaccine).  You have HIV or AIDS.  You use needles to inject street drugs.  You live with, or have sex with, someone who has Hepatitis B.  You get hemodialysis treatment.  You take certain medicines for conditions like cancer, organ transplantation, and autoimmune conditions.  Hepatitis C blood testing is recommended for all people born from [birth year] through 1965 and  any individual with known risks for hepatitis C.  Practice safe sex. Use condoms and avoid high-risk sexual practices to reduce the spread of sexually transmitted infections (STIs). STIs include gonorrhea, chlamydia, syphilis, trichomonas, herpes, HPV, and human immunodeficiency virus (HIV). Herpes, HIV, and HPV are viral illnesses that have no cure. They can result in disability, cancer, and death. Sexually active women aged 66  years and younger should be checked for chlamydia. Older women with new or multiple partners should also be tested for chlamydia. Testing for other STIs is recommended if you are sexually active and at increased risk.  Osteoporosis is a disease in which the bones lose minerals and strength with aging. This can result in serious bone fractures or breaks. The risk of osteoporosis can be identified using a bone density scan. Women ages 18 years and over and women at risk for fractures or osteoporosis should discuss screening with their health care providers. Ask your health care provider whether you should take a calcium supplement or vitamin D to reduce the rate of osteoporosis.  Menopause can be associated with physical symptoms and risks. Hormone replacement therapy is available to decrease symptoms and risks. You should talk to your health care provider about whether hormone replacement therapy is right for you.  Use sunscreen. Apply sunscreen liberally and repeatedly throughout the day. You should seek shade when your shadow is shorter than you. Protect yourself by wearing long sleeves, pants, a wide-brimmed hat, and sunglasses year round, whenever you are outdoors.  Once a month, do a whole body skin exam, using a mirror to look at the skin on your back. Tell your health care provider of new moles, moles that have irregular borders, moles that are larger than a pencil eraser, or moles that have changed in shape or color.  Stay current with required vaccines  (immunizations).  Influenza vaccine. All adults should be immunized every year.  Tetanus, diphtheria, and acellular pertussis (Td, Tdap) vaccine. Pregnant women should receive 1 dose of Tdap vaccine during each pregnancy. The dose should be obtained regardless of the length of time since the last dose. Immunization is preferred during the 27th 36th week of gestation. An adult who has not previously received Tdap or who does not know her vaccine status should receive 1 dose of Tdap. This initial dose should be followed by tetanus and diphtheria toxoids (Td) booster doses every 10 years. Adults with an unknown or incomplete history of completing a 3-dose immunization series with Td-containing vaccines should begin or complete a primary immunization series including a Tdap dose. Adults should receive a Td booster every 10 years.  Varicella vaccine. An adult without evidence of immunity to varicella should receive 2 doses or a second dose if she has previously received 1 dose. Pregnant females who do not have evidence of immunity should receive the first dose after pregnancy. This first dose should be obtained before leaving the health care facility. The second dose should be obtained 4 8 weeks after the first dose.  Human papillomavirus (HPV) vaccine. Females aged 9 26 years who have not received the vaccine previously should obtain the 3-dose series. The vaccine is not recommended for use in pregnant females. However, pregnancy testing is not needed before receiving a dose. If a female is found to be pregnant after receiving a dose, no treatment is needed. In that case, the remaining doses should be delayed until after the pregnancy. Immunization is recommended for any person with an immunocompromised condition through the age of 51 years if she did not get any or all doses earlier. During the 3-dose series, the second dose should be obtained 4 8 weeks after the first dose. The third dose should be obtained  24 weeks after the first dose and 16 weeks after the second dose.  Zoster vaccine. One dose is recommended for adults aged 57 years or older unless certain  conditions are present.  Measles, mumps, and rubella (MMR) vaccine. Adults born before [birth year] generally are considered immune to measles and mumps. Adults born in [birth year] or later should have 1 or more doses of MMR vaccine unless there is a contraindication to the vaccine or there is laboratory evidence of immunity to each of the three diseases. A routine second dose of MMR vaccine should be obtained at least 28 days after the first dose for students attending postsecondary schools, health care workers, or international travelers. People who received inactivated measles vaccine or an unknown type of measles vaccine during 1963 1967 should receive 2 doses of MMR vaccine. People who received inactivated mumps vaccine or an unknown type of mumps vaccine before 1979 and are at high risk for mumps infection should consider immunization with 2 doses of MMR vaccine. For females of childbearing age, rubella immunity should be determined. If there is no evidence of immunity, females who are not pregnant should be vaccinated. If there is no evidence of immunity, females who are pregnant should delay immunization until after pregnancy. Unvaccinated health care workers born before [birth year] who lack laboratory evidence of measles, mumps, or rubella immunity or laboratory confirmation of disease should consider measles and mumps immunization with 2 doses of MMR vaccine or rubella immunization with 1 dose of MMR vaccine.  Pneumococcal 13-valent conjugate (PCV13) vaccine. When indicated, a person who is uncertain of her immunization history and has no record of immunization should receive the PCV13 vaccine. An adult aged 42 years or older who has certain medical conditions and has not been previously immunized should receive 1 dose of PCV13 vaccine. This PCV13 should be followed  with a dose of pneumococcal polysaccharide (PPSV23) vaccine. The PPSV23 vaccine dose should be obtained at least 8 weeks after the dose of PCV13 vaccine. An adult aged 4 years or older who has certain medical conditions and previously received 1 or more doses of PPSV23 vaccine should receive 1 dose of PCV13. The PCV13 vaccine dose should be obtained 1 or more years after the last PPSV23 vaccine dose.  Pneumococcal polysaccharide (PPSV23) vaccine. When PCV13 is also indicated, PCV13 should be obtained first. All adults aged 27 years and older should be immunized. An adult younger than age 33 years who has certain medical conditions should be immunized. Any person who resides in a nursing home or long-term care facility should be immunized. An adult smoker should be immunized. People with an immunocompromised condition and certain other conditions should receive both PCV13 and PPSV23 vaccines. People with human immunodeficiency virus (HIV) infection should be immunized as soon as possible after diagnosis. Immunization during chemotherapy or radiation therapy should be avoided. Routine use of PPSV23 vaccine is not recommended for American Indians, [place] Natives, or people younger than 65 years unless there are medical conditions that require PPSV23 vaccine. When indicated, people who have unknown immunization and have no record of immunization should receive PPSV23 vaccine. One-time revaccination 5 years after the first dose of PPSV23 is recommended for people aged 13 64 years who have chronic kidney failure, nephrotic syndrome, asplenia, or immunocompromised conditions. People who received 1 2 doses of PPSV23 before age 66 years should receive another dose of PPSV23 vaccine at age 27 years or later if at least 5 years have passed since the previous dose. Doses of PPSV23 are not needed for people immunized with PPSV23 at or after age 33 years.  Meningococcal vaccine. Adults with asplenia or persistent complement  component deficiencies should receive 2  doses of quadrivalent meningococcal conjugate (MenACWY-D) vaccine. The doses should be obtained at least 2 months apart. Microbiologists working with certain meningococcal bacteria, [place] recruits, people at risk during an outbreak, and people who travel to or live in countries with a high rate of meningitis should be immunized. A first-year college student up through age 49 years who is living in a residence hall should receive a dose if she did not receive a dose on or after her 16th birthday. Adults who have certain high-risk conditions should receive one or more doses of vaccine.  Hepatitis A vaccine. Adults who wish to be protected from this disease, have certain high-risk conditions, work with hepatitis A-infected animals, work in hepatitis A research labs, or travel to or work in countries with a high rate of hepatitis A should be immunized. Adults who were previously unvaccinated and who anticipate close contact with an international adoptee during the first 60 days after arrival in the [place] from a country with a high rate of hepatitis A should be immunized.  Hepatitis B vaccine. Adults who wish to be protected from this disease, have certain high-risk conditions, may be exposed to blood or other infectious body fluids, are household contacts or sex partners of hepatitis B positive people, are clients or workers in certain care facilities, or travel to or work in countries with a high rate of hepatitis B should be immunized.  Haemophilus influenzae type b (Hib) vaccine. A previously unvaccinated person with asplenia or sickle cell disease or having a scheduled splenectomy should receive 1 dose of Hib vaccine. Regardless of previous immunization, a recipient of a hematopoietic stem cell transplant should receive a 3-dose series 6 12 months after her successful transplant. Hib vaccine is not recommended for adults with HIV infection. Preventive  Services / Frequency Ages 24 to 39years  Blood pressure check.** / Every 1 to 2 years.  Lipid and cholesterol check.** / Every 5 years beginning at age 66.  Clinical breast exam.** / Every 3 years for women in their 12s and 24s.  BRCA-related cancer risk assessment.** / For women who have family members with a BRCA-related cancer (breast, ovarian, tubal, or peritoneal cancers).  Pap test.** / Every 2 years from ages 31 through 69. Every 3 years starting at age 64 through age 76 or 89 with a history of 3 consecutive normal Pap tests.  HPV screening.** / Every 3 years from ages 10 through ages 10 to 96 with a history of 3 consecutive normal Pap tests.  Hepatitis C blood test.** / For any individual with known risks for hepatitis C.  Skin self-exam. / Monthly.  Influenza vaccine. / Every year.  Tetanus, diphtheria, and acellular pertussis (Tdap, Td) vaccine.** / Consult your health care provider. Pregnant women should receive 1 dose of Tdap vaccine during each pregnancy. 1 dose of Td every 10 years.  Varicella vaccine.** / Consult your health care provider. Pregnant females who do not have evidence of immunity should receive the first dose after pregnancy.  HPV vaccine. / 3 doses over 6 months, if 90 and younger. The vaccine is not recommended for use in pregnant females. However, pregnancy testing is not needed before receiving a dose.  Measles, mumps, rubella (MMR) vaccine.** / You need at least 1 dose of MMR if you were born in 1957 or later. You may also need a 2nd dose. For females of childbearing age, rubella immunity should be determined. If there is no evidence of immunity, females who are not  pregnant should be vaccinated. If there is no evidence of immunity, females who are pregnant should delay immunization until after pregnancy.  Pneumococcal 13-valent conjugate (PCV13) vaccine.** / Consult your health care provider.  Pneumococcal polysaccharide (PPSV23) vaccine.** / 1 to 2  doses if you smoke cigarettes or if you have certain conditions.  Meningococcal vaccine.** / 1 dose if you are age 88 to 6 years and a Market researcher living in a residence hall, or have one of several medical conditions, you need to get vaccinated against meningococcal disease. You may also need additional booster doses.  Hepatitis A vaccine.** / Consult your health care provider.  Hepatitis B vaccine.** / Consult your health care provider.  Haemophilus influenzae type b (Hib) vaccine.** / Consult your health care provider. Ages 23 to 64years  Blood pressure check.** / Every 1 to 2 years.  Lipid and cholesterol check.** / Every 5 years beginning at age 20 years.  Lung cancer screening. / Every year if you are aged 51 80 years and have a 30-pack-year history of smoking and currently smoke or have quit within the past 15 years. Yearly screening is stopped once you have quit smoking for at least 15 years or develop a health problem that would prevent you from having lung cancer treatment.  Clinical breast exam.** / Every year after age 8 years.  BRCA-related cancer risk assessment.** / For women who have family members with a BRCA-related cancer (breast, ovarian, tubal, or peritoneal cancers).  Mammogram.** / Every year beginning at age 10 years and continuing for as long as you are in good health. Consult with your health care provider.  Pap test.** / Every 3 years starting at age 30 years through age 5 or 61 years with a history of 3 consecutive normal Pap tests.  HPV screening.** / Every 3 years from ages 39 years through ages 72 to 19 years with a history of 3 consecutive normal Pap tests.  Fecal occult blood test (FOBT) of stool. / Every year beginning at age 59 years and continuing until age 27 years. You may not need to do this test if you get a colonoscopy every 10 years.  Flexible sigmoidoscopy or colonoscopy.** / Every 5 years for a flexible sigmoidoscopy or every  10 years for a colonoscopy beginning at age 110 years and continuing until age 63 years.  Hepatitis C blood test.** / For all people born from [birth year] through 1967 and any individual with known risks for hepatitis C.  Skin self-exam. / Monthly.  Influenza vaccine. / Every year.  Tetanus, diphtheria, and acellular pertussis (Tdap/Td) vaccine.** / Consult your health care provider. Pregnant women should receive 1 dose of Tdap vaccine during each pregnancy. 1 dose of Td every 10 years.  Varicella vaccine.** / Consult your health care provider. Pregnant females who do not have evidence of immunity should receive the first dose after pregnancy.  Zoster vaccine.** / 1 dose for adults aged 46 years or older.  Measles, mumps, rubella (MMR) vaccine.** / You need at least 1 dose of MMR if you were born in 1957 or later. You may also need a 2nd dose. For females of childbearing age, rubella immunity should be determined. If there is no evidence of immunity, females who are not pregnant should be vaccinated. If there is no evidence of immunity, females who are pregnant should delay immunization until after pregnancy.  Pneumococcal 13-valent conjugate (PCV13) vaccine.** / Consult your health care provider.  Pneumococcal polysaccharide (PPSV23) vaccine.** / 1  to 2 doses if you smoke cigarettes or if you have certain conditions.  Meningococcal vaccine.** / Consult your health care provider.  Hepatitis A vaccine.** / Consult your health care provider.  Hepatitis B vaccine.** / Consult your health care provider.  Haemophilus influenzae type b (Hib) vaccine.** / Consult your health care provider. Ages 65 years and over  Blood pressure check.** / Every 1 to 2 years.  Lipid and cholesterol check.** / Every 5 years beginning at age 20 years.  Lung cancer screening. / Every year if you are aged 55 80 years and have a 30-pack-year history of smoking and currently smoke or have quit within the past 15 years.  Yearly screening is stopped once you have quit smoking for at least 15 years or develop a health problem that would prevent you from having lung cancer treatment.  Clinical breast exam.** / Every year after age 40 years.  BRCA-related cancer risk assessment.** / For women who have family members with a BRCA-related cancer (breast, ovarian, tubal, or peritoneal cancers).  Mammogram.** / Every year beginning at age 40 years and continuing for as long as you are in good health. Consult with your health care provider.  Pap test.** / Every 3 years starting at age 30 years through age 65 or 70 years with 3 consecutive normal Pap tests. Testing can be stopped between 65 and 70 years with 3 consecutive normal Pap tests and no abnormal Pap or HPV tests in the past 10 years.  HPV screening.** / Every 3 years from ages 30 years through ages 65 or 70 years with a history of 3 consecutive normal Pap tests. Testing can be stopped between 65 and 70 years with 3 consecutive normal Pap tests and no abnormal Pap or HPV tests in the past 10 years.  Fecal occult blood test (FOBT) of stool. / Every year beginning at age 50 years and continuing until age 75 years. You may not need to do this test if you get a colonoscopy every 10 years.  Flexible sigmoidoscopy or colonoscopy.** / Every 5 years for a flexible sigmoidoscopy or every 10 years for a colonoscopy beginning at age 50 years and continuing until age 75 years.  Hepatitis C blood test.** / For all people born from 1945 through 1965 and any individual with known risks for hepatitis C.  Osteoporosis screening.** / A one-time screening for women ages 65 years and over and women at risk for fractures or osteoporosis.  Skin self-exam. / Monthly.  Influenza vaccine. / Every year.  Tetanus, diphtheria, and acellular pertussis (Tdap/Td) vaccine.** / 1 dose of Td every 10 years.  Varicella vaccine.** / Consult your health care provider.  Zoster vaccine.** / 1  dose for adults aged 60 years or older.  Pneumococcal 13-valent conjugate (PCV13) vaccine.** / Consult your health care provider.  Pneumococcal polysaccharide (PPSV23) vaccine.** / 1 dose for all adults aged 65 years and older.  Meningococcal vaccine.** / Consult your health care provider.  Hepatitis A vaccine.** / Consult your health care provider.  Hepatitis B vaccine.** / Consult your health care provider.  Haemophilus influenzae type b (Hib) vaccine.** / Consult your health care provider. ** Family history and personal history of risk and conditions may change your health care provider's recommendations. Document Released: 12/16/2001 Document Revised: 08/10/2013 Document Reviewed: 03/17/2011 [employer] Patient Information 2014 [employer]. Hypothyroidism The thyroid is a large gland located in the lower front of your neck. The thyroid gland helps control metabolism. Metabolism is how your   body handles food. It controls metabolism with the hormone thyroxine. When this gland is underactive (hypothyroid), it produces too little hormone.  CAUSES These include:   Absence or destruction of thyroid tissue.  Goiter due to iodine deficiency.  Goiter due to medications.  Congenital defects (since birth).  Problems with the pituitary. This causes a lack of TSH (thyroid stimulating hormone). This hormone tells the thyroid to turn out more hormone. SYMPTOMS  Lethargy (feeling as though you have no energy)  Cold intolerance  Weight gain (in spite of normal food intake)  Dry skin  Coarse hair  Menstrual irregularity (if severe, may lead to infertility)  Slowing of thought processes Cardiac problems are also caused by insufficient amounts of thyroid hormone. Hypothyroidism in the newborn is cretinism, and is an extreme form. It is important that this form be treated adequately and immediately or it will lead rapidly to retarded physical and mental development. DIAGNOSIS  To prove  hypothyroidism, your caregiver may do blood tests and ultrasound tests. Sometimes the signs are hidden. It may be necessary for your caregiver to watch this illness with blood tests either before or after diagnosis and treatment. TREATMENT  Low levels of thyroid hormone are increased by using synthetic thyroid hormone. This is a safe, effective treatment. It usually takes about four weeks to gain the full effects of the medication. After you have the full effect of the medication, it will generally take another four weeks for problems to leave. Your caregiver may start you on low doses. If you have had heart problems the dose may be gradually increased. It is generally not an emergency to get rapidly to normal. HOME CARE INSTRUCTIONS   Take your medications as your caregiver suggests. Let your caregiver know of any medications you are taking or start taking. Your caregiver will help you with dosage schedules.  As your condition improves, your dosage needs may increase. It will be necessary to have continuing blood tests as suggested by your caregiver.  Report all suspected medication side effects to your caregiver. SEEK MEDICAL CARE IF: Seek medical care if you develop:  Sweating.  Tremulousness (tremors).  Anxiety.  Rapid weight loss.  Heat intolerance.  Emotional swings.  Diarrhea.  Weakness. SEEK IMMEDIATE MEDICAL CARE IF:  You develop chest pain, an irregular heart beat (palpitations), or a rapid heart beat. MAKE SURE YOU:   Understand these instructions.  Will watch your condition.  Will get help right away if you are not doing well or get worse. Document Released: 10/20/2005 Document Revised: 01/12/2012 Document Reviewed: 06/09/2008 [facility] Patient Information 2014 [place].

## 2014-04-12 NOTE — Progress Notes (Signed)
Pre visit review using our clinic review tool, if applicable. No additional management support is needed unless otherwise documented below in the visit note. 

## 2014-04-12 NOTE — Assessment & Plan Note (Addendum)

## 2014-04-14 ENCOUNTER — Encounter: Payer: Self-pay | Admitting: Internal Medicine

## 2014-04-17 NOTE — Progress Notes (Signed)
Encounter reviewed by Dr. Brook Silva.  

## 2014-04-25 DIAGNOSIS — J309 Allergic rhinitis, unspecified: Secondary | ICD-10-CM | POA: Diagnosis not present

## 2014-05-12 ENCOUNTER — Telehealth: Payer: Self-pay | Admitting: Nurse Practitioner

## 2014-05-12 NOTE — Telephone Encounter (Signed)
Patient calling to check on the status of the prior authorization she brought in for Estradiol. She is still not able to fill it.

## 2014-05-15 DIAGNOSIS — J309 Allergic rhinitis, unspecified: Secondary | ICD-10-CM | POA: Diagnosis not present

## 2014-05-15 NOTE — Telephone Encounter (Signed)
04/28/14 Called [employer] and they state plan has lapsed or expired.  Need Updated insurance information.  Message left to return call to [place] at [phone].

## 2014-05-15 NOTE — Telephone Encounter (Signed)
[employer] ID received from patient:  Maria Montgomery [id number] Called and requested prior authorization be sent. Will fax in 5 minutes per rep.  Called [phone]

## 2014-05-16 NOTE — Telephone Encounter (Signed)
Prior authorization faxed with fax confirmation received at this time.   

## 2014-05-18 NOTE — Telephone Encounter (Signed)
Authorization received from 05/17/14-11/02/14.  Detailed message left on home number to advise. Okay per designated party release form. Called [facility] and notified of approval. Rx goes through for $32.12 Routing to provider for final review. Patient agreeable to disposition. Will close encounter.

## 2014-05-29 DIAGNOSIS — J309 Allergic rhinitis, unspecified: Secondary | ICD-10-CM | POA: Diagnosis not present

## 2014-05-31 DIAGNOSIS — J309 Allergic rhinitis, unspecified: Secondary | ICD-10-CM | POA: Diagnosis not present

## 2014-06-21 DIAGNOSIS — J309 Allergic rhinitis, unspecified: Secondary | ICD-10-CM | POA: Diagnosis not present

## 2014-06-28 DIAGNOSIS — J309 Allergic rhinitis, unspecified: Secondary | ICD-10-CM | POA: Diagnosis not present

## 2014-07-12 DIAGNOSIS — J301 Allergic rhinitis due to pollen: Secondary | ICD-10-CM | POA: Diagnosis not present

## 2014-07-12 DIAGNOSIS — J3089 Other allergic rhinitis: Secondary | ICD-10-CM | POA: Diagnosis not present

## 2014-07-12 DIAGNOSIS — H1045 Other chronic allergic conjunctivitis: Secondary | ICD-10-CM | POA: Diagnosis not present

## 2014-07-12 DIAGNOSIS — L259 Unspecified contact dermatitis, unspecified cause: Secondary | ICD-10-CM | POA: Diagnosis not present

## 2014-07-12 DIAGNOSIS — J309 Allergic rhinitis, unspecified: Secondary | ICD-10-CM | POA: Diagnosis not present

## 2014-07-13 ENCOUNTER — Encounter: Payer: Self-pay | Admitting: Internal Medicine

## 2014-07-13 DIAGNOSIS — H2510 Age-related nuclear cataract, unspecified eye: Secondary | ICD-10-CM | POA: Diagnosis not present

## 2014-07-27 DIAGNOSIS — J309 Allergic rhinitis, unspecified: Secondary | ICD-10-CM | POA: Diagnosis not present

## 2014-08-02 DIAGNOSIS — H2510 Age-related nuclear cataract, unspecified eye: Secondary | ICD-10-CM | POA: Diagnosis not present

## 2014-08-08 DIAGNOSIS — J301 Allergic rhinitis due to pollen: Secondary | ICD-10-CM | POA: Diagnosis not present

## 2014-08-08 DIAGNOSIS — J3089 Other allergic rhinitis: Secondary | ICD-10-CM | POA: Diagnosis not present

## 2014-08-09 DIAGNOSIS — H2512 Age-related nuclear cataract, left eye: Secondary | ICD-10-CM | POA: Diagnosis not present

## 2014-08-23 DIAGNOSIS — J301 Allergic rhinitis due to pollen: Secondary | ICD-10-CM | POA: Diagnosis not present

## 2014-08-23 DIAGNOSIS — J3089 Other allergic rhinitis: Secondary | ICD-10-CM | POA: Diagnosis not present

## 2014-09-04 ENCOUNTER — Encounter: Payer: Self-pay | Admitting: Internal Medicine

## 2014-09-04 DIAGNOSIS — Z961 Presence of intraocular lens: Secondary | ICD-10-CM | POA: Diagnosis not present

## 2014-09-06 DIAGNOSIS — Z23 Encounter for immunization: Secondary | ICD-10-CM | POA: Diagnosis not present

## 2014-09-06 DIAGNOSIS — J3089 Other allergic rhinitis: Secondary | ICD-10-CM | POA: Diagnosis not present

## 2014-09-06 DIAGNOSIS — J301 Allergic rhinitis due to pollen: Secondary | ICD-10-CM | POA: Diagnosis not present

## 2014-09-19 DIAGNOSIS — J301 Allergic rhinitis due to pollen: Secondary | ICD-10-CM | POA: Diagnosis not present

## 2014-09-19 DIAGNOSIS — J3089 Other allergic rhinitis: Secondary | ICD-10-CM | POA: Diagnosis not present

## 2014-10-05 ENCOUNTER — Other Ambulatory Visit: Payer: Self-pay | Admitting: Nurse Practitioner

## 2014-10-05 NOTE — Telephone Encounter (Signed)
Last refilled/AEX: 04/10/14 with Ms. Patty AEX Scheduled: 04/16/15 with Ms. Patty  Please Advise.

## 2014-10-06 DIAGNOSIS — J3089 Other allergic rhinitis: Secondary | ICD-10-CM | POA: Diagnosis not present

## 2014-10-06 DIAGNOSIS — J301 Allergic rhinitis due to pollen: Secondary | ICD-10-CM | POA: Diagnosis not present

## 2014-10-06 NOTE — Telephone Encounter (Signed)
RX printed, signed and faxed to [place].

## 2014-10-16 DIAGNOSIS — J301 Allergic rhinitis due to pollen: Secondary | ICD-10-CM | POA: Diagnosis not present

## 2014-10-16 DIAGNOSIS — J3089 Other allergic rhinitis: Secondary | ICD-10-CM | POA: Diagnosis not present

## 2014-10-18 DIAGNOSIS — J3089 Other allergic rhinitis: Secondary | ICD-10-CM | POA: Diagnosis not present

## 2014-11-10 DIAGNOSIS — J301 Allergic rhinitis due to pollen: Secondary | ICD-10-CM | POA: Diagnosis not present

## 2014-11-10 DIAGNOSIS — J3089 Other allergic rhinitis: Secondary | ICD-10-CM | POA: Diagnosis not present

## 2014-11-20 DIAGNOSIS — J301 Allergic rhinitis due to pollen: Secondary | ICD-10-CM | POA: Diagnosis not present

## 2014-11-20 DIAGNOSIS — J3089 Other allergic rhinitis: Secondary | ICD-10-CM | POA: Diagnosis not present

## 2014-11-21 DIAGNOSIS — B353 Tinea pedis: Secondary | ICD-10-CM | POA: Diagnosis not present

## 2014-11-21 DIAGNOSIS — L280 Lichen simplex chronicus: Secondary | ICD-10-CM | POA: Diagnosis not present

## 2014-11-21 DIAGNOSIS — D1801 Hemangioma of skin and subcutaneous tissue: Secondary | ICD-10-CM | POA: Diagnosis not present

## 2014-11-21 DIAGNOSIS — L570 Actinic keratosis: Secondary | ICD-10-CM | POA: Diagnosis not present

## 2014-11-21 DIAGNOSIS — L821 Other seborrheic keratosis: Secondary | ICD-10-CM | POA: Diagnosis not present

## 2014-11-29 DIAGNOSIS — J3089 Other allergic rhinitis: Secondary | ICD-10-CM | POA: Diagnosis not present

## 2014-11-29 DIAGNOSIS — J301 Allergic rhinitis due to pollen: Secondary | ICD-10-CM | POA: Diagnosis not present

## 2014-12-04 DIAGNOSIS — Z961 Presence of intraocular lens: Secondary | ICD-10-CM | POA: Diagnosis not present

## 2014-12-06 DIAGNOSIS — J3089 Other allergic rhinitis: Secondary | ICD-10-CM | POA: Diagnosis not present

## 2014-12-06 DIAGNOSIS — J301 Allergic rhinitis due to pollen: Secondary | ICD-10-CM | POA: Diagnosis not present

## 2014-12-22 DIAGNOSIS — J3089 Other allergic rhinitis: Secondary | ICD-10-CM | POA: Diagnosis not present

## 2014-12-22 DIAGNOSIS — J301 Allergic rhinitis due to pollen: Secondary | ICD-10-CM | POA: Diagnosis not present

## 2014-12-26 ENCOUNTER — Encounter: Payer: Self-pay | Admitting: Podiatry

## 2014-12-26 ENCOUNTER — Ambulatory Visit (INDEPENDENT_AMBULATORY_CARE_PROVIDER_SITE_OTHER): Payer: Medicare Other

## 2014-12-26 ENCOUNTER — Ambulatory Visit (INDEPENDENT_AMBULATORY_CARE_PROVIDER_SITE_OTHER): Payer: Medicare Other | Admitting: Podiatry

## 2014-12-26 VITALS — BP 141/75 | HR 65 | Resp 16

## 2014-12-26 DIAGNOSIS — M2041 Other hammer toe(s) (acquired), right foot: Secondary | ICD-10-CM

## 2014-12-26 DIAGNOSIS — M79671 Pain in right foot: Secondary | ICD-10-CM

## 2014-12-26 DIAGNOSIS — M779 Enthesopathy, unspecified: Secondary | ICD-10-CM | POA: Diagnosis not present

## 2014-12-26 DIAGNOSIS — L84 Corns and callosities: Secondary | ICD-10-CM | POA: Diagnosis not present

## 2014-12-26 NOTE — Progress Notes (Signed)
   Subjective:    Patient ID: Maria Montgomery, female    DOB: 1946/08/26, 69 y.o.   MRN: [id number]  HPI Comments: "I have something with this toe"  Patient c/op tender 5th toe right, interdigitally, for several months. The area is callused. Certain shoes irritate. The whole toe gets red occasionally.  Toe Pain       Review of Systems  All other systems reviewed and are negative.      Objective:   Physical Exam        Assessment & Plan:

## 2014-12-26 NOTE — Progress Notes (Signed)
Subjective:     Patient ID: Maria Montgomery, female   DOB: [birth year], 69 y.o.   MRN: [id number]  HPI patient presents with pain between the fourth and fifth toes of the right foot with fluid buildup and keratotic lesion more pronounced on the fourth over the fifth toe of several months duration   Review of Systems  All other systems reviewed and are negative.      Objective:   Physical Exam  Constitutional: She is oriented to person, place, and time.  Cardiovascular: Intact distal pulses.   Musculoskeletal: Normal range of motion.  Neurological: She is oriented to person, place, and time.  Skin: Skin is warm and dry. Rash: neurovascular status intact muscle strength adequate with range of motion subtalar midtarsal joint within normal limits.  Nursing note and vitals reviewed.  patient is noted to have good digital perfusion and is well oriented 3 and does have a inflammatory area with fluid buildup fourth metatarsophalangeal joint lateral side and keratotic lesion formation that's painful fourth and fifth toes     Assessment:     Interspace lesion with inflammatory capsulitis fourth interspace right with digital deformity of the fifth toe noted    Plan:     H&P and x-ray reviewed. Injection of the fourth MPJ 3 mg dexamethasone Kenalog 5 mg Xylocaine administered and then deep debridement of lesion accomplished with Band-Aid application. Patient will be seen back if symptoms persist or any other issues should occur

## 2014-12-27 DIAGNOSIS — J3089 Other allergic rhinitis: Secondary | ICD-10-CM | POA: Diagnosis not present

## 2014-12-27 DIAGNOSIS — J301 Allergic rhinitis due to pollen: Secondary | ICD-10-CM | POA: Diagnosis not present

## 2015-01-15 ENCOUNTER — Telehealth: Payer: Self-pay

## 2015-01-15 NOTE — Telephone Encounter (Signed)
Call to the patient and spouse answered. Stated mbr not home and not sure if she rec'd flu shot. Stated mbr can come in prior to March 31st and receive a flu shot or call the practice to confirm receipt.

## 2015-01-16 DIAGNOSIS — J3089 Other allergic rhinitis: Secondary | ICD-10-CM | POA: Diagnosis not present

## 2015-01-16 DIAGNOSIS — J301 Allergic rhinitis due to pollen: Secondary | ICD-10-CM | POA: Diagnosis not present

## 2015-01-18 DIAGNOSIS — J3089 Other allergic rhinitis: Secondary | ICD-10-CM | POA: Diagnosis not present

## 2015-01-23 DIAGNOSIS — J3089 Other allergic rhinitis: Secondary | ICD-10-CM | POA: Diagnosis not present

## 2015-02-06 DIAGNOSIS — J301 Allergic rhinitis due to pollen: Secondary | ICD-10-CM | POA: Diagnosis not present

## 2015-02-06 DIAGNOSIS — J3089 Other allergic rhinitis: Secondary | ICD-10-CM | POA: Diagnosis not present

## 2015-02-20 DIAGNOSIS — J301 Allergic rhinitis due to pollen: Secondary | ICD-10-CM | POA: Diagnosis not present

## 2015-02-20 DIAGNOSIS — J3089 Other allergic rhinitis: Secondary | ICD-10-CM | POA: Diagnosis not present

## 2015-02-21 DIAGNOSIS — J301 Allergic rhinitis due to pollen: Secondary | ICD-10-CM | POA: Diagnosis not present

## 2015-03-02 ENCOUNTER — Ambulatory Visit (INDEPENDENT_AMBULATORY_CARE_PROVIDER_SITE_OTHER): Payer: Medicare Other | Admitting: Podiatry

## 2015-03-02 ENCOUNTER — Encounter: Payer: Self-pay | Admitting: Podiatry

## 2015-03-02 VITALS — BP 126/76 | HR 69 | Resp 12

## 2015-03-02 DIAGNOSIS — L84 Corns and callosities: Secondary | ICD-10-CM

## 2015-03-02 DIAGNOSIS — M779 Enthesopathy, unspecified: Secondary | ICD-10-CM

## 2015-03-02 MED ORDER — TRIAMCINOLONE ACETONIDE 10 MG/ML IJ SUSP
10.0000 mg | Freq: Once | INTRAMUSCULAR | Status: AC
  Administered 2015-03-02: 10 mg

## 2015-03-03 NOTE — Progress Notes (Signed)
Subjective:     Patient ID: Maria Montgomery, female   DOB: December 14, 1945, 69 y.o.   MRN: [id number]  HPI patient states I'm still having pain between my fourth and fifth toes on my right foot and the medication you gave me only lasted for a period of time   Review of Systems     Objective:   Physical Exam Neurovascular status intact muscle strength adequate and patient noted to have inflammation fourth interspace right foot with keratotic lesion noted on both the fourth and fifth toes pressing against each other    Assessment:     Interspace lesion right with inflammatory changes and keratotic lesion fourth and fifth toes    Plan:     Discussed the inflammatory nature of this condition and today did a careful injection 3 mg Dexon some Kenalog 5 mill grams Xylocaine and then did deep debridement of lesions. Discussed arthroplasty fifth toe along with partial soft tissue syndactylization which will be needed at one point and she would like to see if she can make it through the summer and do it in the fall area schedule for procedure as she is able to

## 2015-03-06 DIAGNOSIS — J301 Allergic rhinitis due to pollen: Secondary | ICD-10-CM | POA: Diagnosis not present

## 2015-03-06 DIAGNOSIS — J3089 Other allergic rhinitis: Secondary | ICD-10-CM | POA: Diagnosis not present

## 2015-03-22 ENCOUNTER — Other Ambulatory Visit: Payer: Self-pay | Admitting: Internal Medicine

## 2015-03-23 DIAGNOSIS — J301 Allergic rhinitis due to pollen: Secondary | ICD-10-CM | POA: Diagnosis not present

## 2015-03-23 DIAGNOSIS — J3089 Other allergic rhinitis: Secondary | ICD-10-CM | POA: Diagnosis not present

## 2015-03-27 ENCOUNTER — Other Ambulatory Visit: Payer: Self-pay | Admitting: Internal Medicine

## 2015-04-04 ENCOUNTER — Encounter: Payer: Self-pay | Admitting: Internal Medicine

## 2015-04-04 ENCOUNTER — Other Ambulatory Visit: Payer: Self-pay | Admitting: Nurse Practitioner

## 2015-04-04 DIAGNOSIS — J3089 Other allergic rhinitis: Secondary | ICD-10-CM | POA: Diagnosis not present

## 2015-04-04 DIAGNOSIS — E039 Hypothyroidism, unspecified: Secondary | ICD-10-CM

## 2015-04-04 DIAGNOSIS — J301 Allergic rhinitis due to pollen: Secondary | ICD-10-CM | POA: Diagnosis not present

## 2015-04-04 MED ORDER — LEVOTHYROXINE SODIUM 50 MCG PO TABS: 50.0000 ug | ORAL_TABLET | Freq: Every day | ORAL | Status: DC

## 2015-04-04 NOTE — Telephone Encounter (Signed)
Medication refill request: Ambien 10 mg  Last AEX:  04/10/14 with PG  Next AEX: 04/16/15 with PG  Last MMG (if hormonal medication request): n/a Refill authorized: #[id number] rfs, please advise.

## 2015-04-05 NOTE — Telephone Encounter (Signed)
Rx printed and faxed to [facility].

## 2015-04-11 DIAGNOSIS — H1045 Other chronic allergic conjunctivitis: Secondary | ICD-10-CM | POA: Diagnosis not present

## 2015-04-11 DIAGNOSIS — J3089 Other allergic rhinitis: Secondary | ICD-10-CM | POA: Diagnosis not present

## 2015-04-11 DIAGNOSIS — J301 Allergic rhinitis due to pollen: Secondary | ICD-10-CM | POA: Diagnosis not present

## 2015-04-12 DIAGNOSIS — Z1231 Encounter for screening mammogram for malignant neoplasm of breast: Secondary | ICD-10-CM | POA: Diagnosis not present

## 2015-04-12 DIAGNOSIS — Z803 Family history of malignant neoplasm of breast: Secondary | ICD-10-CM | POA: Diagnosis not present

## 2015-04-12 LAB — HM MAMMOGRAPHY: HM Mammogram: NORMAL

## 2015-04-16 ENCOUNTER — Encounter: Payer: Self-pay | Admitting: Internal Medicine

## 2015-04-16 ENCOUNTER — Encounter: Payer: Self-pay | Admitting: Nurse Practitioner

## 2015-04-16 ENCOUNTER — Ambulatory Visit (INDEPENDENT_AMBULATORY_CARE_PROVIDER_SITE_OTHER): Payer: Medicare Other | Admitting: Nurse Practitioner

## 2015-04-16 VITALS — BP 110/60 | HR 68 | Resp 14 | Ht 59.5 in | Wt 146.0 lb

## 2015-04-16 DIAGNOSIS — Z01419 Encounter for gynecological examination (general) (routine) without abnormal findings: Secondary | ICD-10-CM

## 2015-04-16 DIAGNOSIS — Z124 Encounter for screening for malignant neoplasm of cervix: Secondary | ICD-10-CM | POA: Diagnosis not present

## 2015-04-16 DIAGNOSIS — Z79890 Hormone replacement therapy (postmenopausal): Secondary | ICD-10-CM | POA: Diagnosis not present

## 2015-04-16 MED ORDER — ESTRADIOL 0.1 MG/GM VA CREA: TOPICAL_CREAM | VAGINAL | Status: DC

## 2015-04-16 MED ORDER — ZOLPIDEM TARTRATE 10 MG PO TABS: 10.0000 mg | ORAL_TABLET | Freq: Every evening | ORAL | Status: DC | PRN

## 2015-04-16 NOTE — Progress Notes (Signed)
Patient ID: Maria Montgomery, female   DOB: [birth year], 69 y.o.   MRN: [id number] 69 y.o. [id number] Married  Caucasian Fe here for annual exam.  She is doing well.  She did taper and went off ERT last fall after concerns of mammogram density and [facility] of breast cancer.  Patient's last menstrual period was 07/28/1987.          Sexually active: Yes.    The current method of family planning is tubal ligation.    Exercising: Yes.    weights, areobics and water Smoker:  no  Health Maintenance: Pap: 04-10-14 WNL MMG:  04-12-15 - results are pending Colonoscopy:  06-16-12 repeat in 10 years BMD:   2013 - no results in Epic - will get ROI TDaP:  02-11-10 Shingles 12/23/2006 Prevnar 13 : 04/12/14 Labs: PCP does labs    reports that she has never smoked. She has never used smokeless tobacco. She reports that she drinks about 4.2 oz of alcohol per week. She reports that she does not use illicit drugs.  Past Medical History  Diagnosis Date  . Allergic rhinitis   . Vitamin D deficiency   . Blood transfusion     after tonsillectomy as child; and after hysterectomy  . Diverticulosis 2003  . Hypothyroidism 12/2006    Past Surgical History  Procedure Laterality Date  . Tonsillectomy    . Bunionectomy      hammertoe right '01, hammertoe left '06  . Blepharoplasty    . Tubal ligation    . Colonoscopy  06/15/2012  . Vaginal hysterectomy  1988    had prolapse and blood transfusion    Current Outpatient Prescriptions  Medication Sig Dispense Refill  . Azelastine HCl (ASTEPRO) 0.15 % SOLN by Nasal route as needed.      Marland Kitchen BEPREVE 1.5 % SOLN Place 1.5 drops into both eyes as needed.    . Biotin 5000 MCG CAPS Take 20,000 mcg by mouth daily.     . Cetirizine HCl (ZYRTEC ALLERGY) 10 MG CAPS Take by mouth as needed.     . cholecalciferol (VITAMIN D) 1000 UNITS tablet Take 1,000 Units by mouth daily.    Marland Kitchen DYMISTA 137-50 MCG/ACT SUSP Take 137 mcg by mouth as needed.     Marland Kitchen estradiol (ESTRACE) 0.1 MG/GM vaginal cream  1/2 gm applicator vaginal twice weekly 42.5 g 3  . levothyroxine (SYNTHROID, LEVOTHROID) 50 MCG tablet Take 1 tablet (50 mcg total) by mouth daily before breakfast. 30 tablet 0  . Multiple Vitamins-Minerals (CENTRUM SILVER PO) Take by mouth daily.      . Probiotic Product (PROBIOTIC DAILY PO) Take by mouth.    . ranitidine (ZANTAC) 150 MG tablet Take 150 mg by mouth as needed.      . zolpidem (AMBIEN) 10 MG tablet Take 1 tablet (10 mg total) by mouth at bedtime as needed. for sleep 30 tablet 5   No current facility-administered medications for this visit.    Family History  Problem Relation Age of Onset  . Coronary artery disease Mother   . Breast cancer Mother   . Thyroid disease Mother   . Hypertension Mother   . Osteoporosis Mother   . Lung cancer Father     age 74  . Diabetes Paternal Uncle   . Colon cancer Neg Hx   . Stomach cancer Neg Hx   . Asthma Brother   . Epilepsy Brother   . Diabetes Maternal Grandmother   . Multiple births Paternal Grandmother  ROS:  Pertinent items are noted in HPI.  Otherwise, a comprehensive ROS was negative.  Exam:   BP 110/60 mmHg  Pulse 68  Resp 14  Ht 4' 11.5" (1.511 m)  Wt 146 lb (66.225 kg)  BMI 29.01 kg/m2  LMP 07/28/1987 Height: 4' 11.5" (151.1 cm) Ht Readings from Last 3 Encounters:  04/16/15 4' 11.5" (1.511 m)  04/12/14 5' (1.524 m)  04/10/14 5' (1.524 m)    General appearance: alert, cooperative and appears stated age Head: Normocephalic, without obvious abnormality, atraumatic Neck: no adenopathy, supple, symmetrical, trachea midline and thyroid normal to inspection and palpation Lungs: clear to auscultation bilaterally Breasts: normal appearance, no masses or tenderness, positive findings: fibrocystic changes Heart: regular rate and rhythm Abdomen: soft, non-tender; no masses,  no organomegaly Extremities: extremities normal, atraumatic, no cyanosis or edema Skin: Skin color, texture, turgor normal. No rashes or  lesions Lymph nodes: Cervical, supraclavicular, and axillary nodes normal. No abnormal inguinal nodes palpated Neurologic: Grossly normal   Pelvic: External genitalia:  no lesions              Urethra:  normal appearing urethra with no masses, tenderness or lesions              Bartholin's and Skene's: normal                 Vagina: normal appearing vagina with normal color and discharge, no lesions              Cervix: absent              Pap taken: No. Bimanual Exam:  Uterus:  uterus absent              Adnexa: no mass, fullness, tenderness               Rectovaginal: Confirms               Anus:  normal sphincter tone, no lesions  Chaperone present: No  A:  Well Woman with normal exam  S/P TVH secondary to prolapse 1988 Postmenopausal on ERT since 1993 till fall 2015 History of insomnia  P:   Reviewed health and wellness pertinent to exam  Pap smear as above  Mammogram is done and report is pending  Will get report of BMD  Refill on Ambien - she only uses 1/4 -1/2 prn  She rarely uses the vaginal estrogen cream and if needed will call back.  Counseled on breast self exam, mammography screening, adequate intake of calcium and vitamin D, diet and exercise return annually or prn  An After Visit Summary was printed and given to the patient.

## 2015-04-16 NOTE — Progress Notes (Signed)
Reviewed personally.  M. Suzanne Amara [place], MD.  

## 2015-04-16 NOTE — Patient Instructions (Addendum)

## 2015-04-17 ENCOUNTER — Encounter: Payer: Self-pay | Admitting: Internal Medicine

## 2015-04-17 ENCOUNTER — Other Ambulatory Visit (INDEPENDENT_AMBULATORY_CARE_PROVIDER_SITE_OTHER): Payer: Medicare Other

## 2015-04-17 ENCOUNTER — Telehealth: Payer: Self-pay | Admitting: Nurse Practitioner

## 2015-04-17 ENCOUNTER — Ambulatory Visit (INDEPENDENT_AMBULATORY_CARE_PROVIDER_SITE_OTHER): Payer: Medicare Other | Admitting: Internal Medicine

## 2015-04-17 VITALS — BP 122/82 | HR 68 | Temp 97.8°F | Resp 16 | Ht 59.5 in | Wt 146.8 lb

## 2015-04-17 DIAGNOSIS — E038 Other specified hypothyroidism: Secondary | ICD-10-CM | POA: Diagnosis not present

## 2015-04-17 DIAGNOSIS — M8580 Other specified disorders of bone density and structure, unspecified site: Secondary | ICD-10-CM | POA: Insufficient documentation

## 2015-04-17 DIAGNOSIS — M899 Disorder of bone, unspecified: Secondary | ICD-10-CM

## 2015-04-17 DIAGNOSIS — Z0000 Encounter for general adult medical examination without abnormal findings: Secondary | ICD-10-CM

## 2015-04-17 LAB — URINALYSIS, ROUTINE W REFLEX MICROSCOPIC
Bilirubin Urine: NEGATIVE
HGB URINE DIPSTICK: NEGATIVE
Ketones, ur: NEGATIVE
Leukocytes, UA: NEGATIVE
Nitrite: NEGATIVE
Specific Gravity, Urine: 1.025 (ref 1.000–1.030)
TOTAL PROTEIN, URINE-UPE24: NEGATIVE
Urine Glucose: NEGATIVE
Urobilinogen, UA: 0.2 (ref 0.0–1.0)
pH: 6 (ref 5.0–8.0)

## 2015-04-17 LAB — CBC WITH DIFFERENTIAL/PLATELET
BASOS PCT: 0.6 % (ref 0.0–3.0)
Basophils Absolute: 0 10*3/uL (ref 0.0–0.1)
Eosinophils Absolute: 0.1 10*3/uL (ref 0.0–0.7)
Eosinophils Relative: 1.3 % (ref 0.0–5.0)
HEMATOCRIT: 42.7 % (ref 36.0–46.0)
Hemoglobin: 14.2 g/dL (ref 12.0–15.0)
LYMPHS ABS: 1.5 10*3/uL (ref 0.7–4.0)
Lymphocytes Relative: 24.2 % (ref 12.0–46.0)
MCHC: 33.3 g/dL (ref 30.0–36.0)
MCV: 93.1 fl (ref 78.0–100.0)
MONO ABS: 0.9 10*3/uL (ref 0.1–1.0)
Monocytes Relative: 15.2 % — ABNORMAL HIGH (ref 3.0–12.0)
NEUTROS ABS: 3.6 10*3/uL (ref 1.4–7.7)
Neutrophils Relative %: 58.7 % (ref 43.0–77.0)
Platelets: 235 10*3/uL (ref 150.0–400.0)
RBC: 4.58 Mil/uL (ref 3.87–5.11)
RDW: 13.3 % (ref 11.5–15.5)
WBC: 6.2 10*3/uL (ref 4.0–10.5)

## 2015-04-17 LAB — TSH: TSH: 3.94 u[IU]/mL (ref 0.35–4.50)

## 2015-04-17 LAB — COMPREHENSIVE METABOLIC PANEL
ALBUMIN: 4.3 g/dL (ref 3.5–5.2)
ALT: 18 U/L (ref 0–35)
AST: 21 U/L (ref 0–37)
Alkaline Phosphatase: 51 U/L (ref 39–117)
BILIRUBIN TOTAL: 0.7 mg/dL (ref 0.2–1.2)
BUN: 19 mg/dL (ref 6–23)
CALCIUM: 9.4 mg/dL (ref 8.4–10.5)
CO2: 26 mEq/L (ref 19–32)
Chloride: 104 mEq/L (ref 96–112)
Creatinine, Ser: 0.86 mg/dL (ref 0.40–1.20)
GFR: 69.09 mL/min (ref >60.00)
Glucose, Bld: 93 mg/dL (ref 70–99)
Potassium: 4.4 mEq/L (ref 3.5–5.1)
SODIUM: 138 meq/L (ref 135–145)
Total Protein: 6.9 g/dL (ref 6.0–8.3)

## 2015-04-17 LAB — LIPID PANEL
CHOL/HDL RATIO: 3
Cholesterol: 203 mg/dL — ABNORMAL HIGH (ref 0–200)
HDL: 77.9 mg/dL (ref >39.00)
LDL CALC: 106 mg/dL — AB (ref 0–99)
NonHDL: 125.1
Triglycerides: 96 mg/dL (ref 0.0–149.0)
VLDL: 19.2 mg/dL (ref 0.0–40.0)

## 2015-04-17 LAB — VITAMIN D 25 HYDROXY (VIT D DEFICIENCY, FRACTURES): VITD: 32.99 ng/mL (ref 30.00–100.00)

## 2015-04-17 NOTE — Progress Notes (Signed)
Pre visit review using our clinic review tool, if applicable. No additional management support is needed unless otherwise documented below in the visit note. 

## 2015-04-17 NOTE — Assessment & Plan Note (Signed)

## 2015-04-17 NOTE — Patient Instructions (Signed)
Preventive Care for Adults A healthy lifestyle and preventive care can promote health and wellness. Preventive health guidelines for women include the following key practices.  A routine yearly physical is a good way to check with your health care provider about your health and preventive screening. It is a chance to share any concerns and updates on your health and to receive a thorough exam.  Visit your dentist for a routine exam and preventive care every 6 months. Brush your teeth twice a day and floss once a day. Good oral hygiene prevents tooth decay and gum disease.  The frequency of eye exams is based on your age, health, family medical history, use of contact lenses, and other factors. Follow your health care provider's recommendations for frequency of eye exams.  Eat a healthy diet. Foods like vegetables, fruits, whole grains, low-fat dairy products, and lean protein foods contain the nutrients you need without too many calories. Decrease your intake of foods high in solid fats, added sugars, and salt. Eat the right amount of calories for you.Get information about a proper diet from your health care provider, if necessary.  Regular physical exercise is one of the most important things you can do for your health. Most adults should get at least 150 minutes of moderate-intensity exercise (any activity that increases your heart rate and causes you to sweat) each week. In addition, most adults need muscle-strengthening exercises on 2 or more days a week.  Maintain a healthy weight. The body mass index (BMI) is a screening tool to identify possible weight problems. It provides an estimate of body fat based on height and weight. Your health care provider can find your BMI and can help you achieve or maintain a healthy weight.For adults 20 years and older:  A BMI below 18.5 is considered underweight.  A BMI of 18.5 to 24.9 is normal.  A BMI of 25 to 29.9 is considered overweight.  A BMI of  30 and above is considered obese.  Maintain normal blood lipids and cholesterol levels by exercising and minimizing your intake of saturated fat. Eat a balanced diet with plenty of fruit and vegetables. Blood tests for lipids and cholesterol should begin at age 76 and be repeated every 5 years. If your lipid or cholesterol levels are high, you are over 50, or you are at high risk for heart disease, you may need your cholesterol levels checked more frequently.Ongoing high lipid and cholesterol levels should be treated with medicines if diet and exercise are not working.  If you smoke, find out from your health care provider how to quit. If you do not use tobacco, do not start.  Lung cancer screening is recommended for adults aged 22-80 years who are at high risk for developing lung cancer because of a history of smoking. A yearly low-dose CT scan of the lungs is recommended for people who have at least a 30-pack-year history of smoking and are a current smoker or have quit within the past 15 years. A pack year of smoking is smoking an average of 1 pack of cigarettes a day for 1 year (for example: 1 pack a day for 30 years or 2 packs a day for 15 years). Yearly screening should continue until the smoker has stopped smoking for at least 15 years. Yearly screening should be stopped for people who develop a health problem that would prevent them from having lung cancer treatment.  If you are pregnant, do not drink alcohol. If you are breastfeeding,  be very cautious about drinking alcohol. If you are not pregnant and choose to drink alcohol, do not have more than 1 drink per day. One drink is considered to be 12 ounces (355 mL) of beer, 5 ounces (148 mL) of wine, or 1.5 ounces (44 mL) of liquor.  Avoid use of street drugs. Do not share needles with anyone. Ask for help if you need support or instructions about stopping the use of drugs.  High blood pressure causes heart disease and increases the risk of  stroke. Your blood pressure should be checked at least every 1 to 2 years. Ongoing high blood pressure should be treated with medicines if weight loss and exercise do not work.  If you are 3-86 years old, ask your health care provider if you should take aspirin to prevent strokes.  Diabetes screening involves taking a blood sample to check your fasting blood sugar level. This should be done once every 3 years, after age 67, if you are within normal weight and without risk factors for diabetes. Testing should be considered at a younger age or be carried out more frequently if you are overweight and have at least 1 risk factor for diabetes.  Breast cancer screening is essential preventive care for women. You should practice "breast self-awareness." This means understanding the normal appearance and feel of your breasts and may include breast self-examination. Any changes detected, no matter how small, should be reported to a health care provider. Women in their 8s and 30s should have a clinical breast exam (CBE) by a health care provider as part of a regular health exam every 1 to 3 years. After age 70, women should have a CBE every year. Starting at age 25, women should consider having a mammogram (breast X-ray test) every year. Women who have a family history of breast cancer should talk to their health care provider about genetic screening. Women at a high risk of breast cancer should talk to their health care providers about having an MRI and a mammogram every year.  Breast cancer gene (BRCA)-related cancer risk assessment is recommended for women who have family members with BRCA-related cancers. BRCA-related cancers include breast, ovarian, tubal, and peritoneal cancers. Having family members with these cancers may be associated with an increased risk for harmful changes (mutations) in the breast cancer genes BRCA1 and BRCA2. Results of the assessment will determine the need for genetic counseling and  BRCA1 and BRCA2 testing.  Routine pelvic exams to screen for cancer are no longer recommended for nonpregnant women who are considered low risk for cancer of the pelvic organs (ovaries, uterus, and vagina) and who do not have symptoms. Ask your health care provider if a screening pelvic exam is right for you.  If you have had past treatment for cervical cancer or a condition that could lead to cancer, you need Pap tests and screening for cancer for at least 20 years after your treatment. If Pap tests have been discontinued, your risk factors (such as having a new sexual partner) need to be reassessed to determine if screening should be resumed. Some women have medical problems that increase the chance of getting cervical cancer. In these cases, your health care provider may recommend more frequent screening and Pap tests.  The HPV test is an additional test that may be used for cervical cancer screening. The HPV test looks for the virus that can cause the cell changes on the cervix. The cells collected during the Pap test can be  tested for HPV. The HPV test could be used to screen women aged 30 years and older, and should be used in women of any age who have unclear Pap test results. After the age of 30, women should have HPV testing at the same frequency as a Pap test.  Colorectal cancer can be detected and often prevented. Most routine colorectal cancer screening begins at the age of 50 years and continues through age 75 years. However, your health care provider may recommend screening at an earlier age if you have risk factors for colon cancer. On a yearly basis, your health care provider may provide home test kits to check for hidden blood in the stool. Use of a small camera at the end of a tube, to directly examine the colon (sigmoidoscopy or colonoscopy), can detect the earliest forms of colorectal cancer. Talk to your health care provider about this at age 50, when routine screening begins. Direct  exam of the colon should be repeated every 5-10 years through age 75 years, unless early forms of pre-cancerous polyps or small growths are found.  People who are at an increased risk for hepatitis B should be screened for this virus. You are considered at high risk for hepatitis B if:  You were born in a country where hepatitis B occurs often. Talk with your health care provider about which countries are considered high risk.  Your parents were born in a high-risk country and you have not received a shot to protect against hepatitis B (hepatitis B vaccine).  You have HIV or AIDS.  You use needles to inject street drugs.  You live with, or have sex with, someone who has hepatitis B.  You get hemodialysis treatment.  You take certain medicines for conditions like cancer, organ transplantation, and autoimmune conditions.  Hepatitis C blood testing is recommended for all people born from 1945 through 1965 and any individual with known risks for hepatitis C.  Practice safe sex. Use condoms and avoid high-risk sexual practices to reduce the spread of sexually transmitted infections (STIs). STIs include gonorrhea, chlamydia, syphilis, trichomonas, herpes, HPV, and human immunodeficiency virus (HIV). Herpes, HIV, and HPV are viral illnesses that have no cure. They can result in disability, cancer, and death.  You should be screened for sexually transmitted illnesses (STIs) including gonorrhea and chlamydia if:  You are sexually active and are younger than 24 years.  You are older than 24 years and your health care provider tells you that you are at risk for this type of infection.  Your sexual activity has changed since you were last screened and you are at an increased risk for chlamydia or gonorrhea. Ask your health care provider if you are at risk.  If you are at risk of being infected with HIV, it is recommended that you take a prescription medicine daily to prevent HIV infection. This is  called preexposure prophylaxis (PrEP). You are considered at risk if:  You are a heterosexual woman, are sexually active, and are at increased risk for HIV infection.  You take drugs by injection.  You are sexually active with a partner who has HIV.  Talk with your health care provider about whether you are at high risk of being infected with HIV. If you choose to begin PrEP, you should first be tested for HIV. You should then be tested every 3 months for as long as you are taking PrEP.  Osteoporosis is a disease in which the bones lose minerals and strength   with aging. This can result in serious bone fractures or breaks. The risk of osteoporosis can be identified using a bone density scan. Women ages 65 years and over and women at risk for fractures or osteoporosis should discuss screening with their health care providers. Ask your health care provider whether you should take a calcium supplement or vitamin D to reduce the rate of osteoporosis.  Menopause can be associated with physical symptoms and risks. Hormone replacement therapy is available to decrease symptoms and risks. You should talk to your health care provider about whether hormone replacement therapy is right for you.  Use sunscreen. Apply sunscreen liberally and repeatedly throughout the day. You should seek shade when your shadow is shorter than you. Protect yourself by wearing long sleeves, pants, a wide-brimmed hat, and sunglasses year round, whenever you are outdoors.  Once a month, do a whole body skin exam, using a mirror to look at the skin on your back. Tell your health care provider of new moles, moles that have irregular borders, moles that are larger than a pencil eraser, or moles that have changed in shape or color.  Stay current with required vaccines (immunizations).  Influenza vaccine. All adults should be immunized every year.  Tetanus, diphtheria, and acellular pertussis (Td, Tdap) vaccine. Pregnant women should  receive 1 dose of Tdap vaccine during each pregnancy. The dose should be obtained regardless of the length of time since the last dose. Immunization is preferred during the 27th-36th week of gestation. An adult who has not previously received Tdap or who does not know her vaccine status should receive 1 dose of Tdap. This initial dose should be followed by tetanus and diphtheria toxoids (Td) booster doses every 10 years. Adults with an unknown or incomplete history of completing a 3-dose immunization series with Td-containing vaccines should begin or complete a primary immunization series including a Tdap dose. Adults should receive a Td booster every 10 years.  Varicella vaccine. An adult without evidence of immunity to varicella should receive 2 doses or a second dose if she has previously received 1 dose. Pregnant females who do not have evidence of immunity should receive the first dose after pregnancy. This first dose should be obtained before leaving the health care facility. The second dose should be obtained 4-8 weeks after the first dose.  Human papillomavirus (HPV) vaccine. Females aged 13-26 years who have not received the vaccine previously should obtain the 3-dose series. The vaccine is not recommended for use in pregnant females. However, pregnancy testing is not needed before receiving a dose. If a female is found to be pregnant after receiving a dose, no treatment is needed. In that case, the remaining doses should be delayed until after the pregnancy. Immunization is recommended for any person with an immunocompromised condition through the age of 26 years if she did not get any or all doses earlier. During the 3-dose series, the second dose should be obtained 4-8 weeks after the first dose. The third dose should be obtained 24 weeks after the first dose and 16 weeks after the second dose.  Zoster vaccine. One dose is recommended for adults aged 60 years or older unless certain conditions are  present.  Measles, mumps, and rubella (MMR) vaccine. Adults born before 1957 generally are considered immune to measles and mumps. Adults born in 1957 or later should have 1 or more doses of MMR vaccine unless there is a contraindication to the vaccine or there is laboratory evidence of immunity to   each of the three diseases. A routine second dose of MMR vaccine should be obtained at least 28 days after the first dose for students attending postsecondary schools, health care workers, or international travelers. People who received inactivated measles vaccine or an unknown type of measles vaccine during 1963-1967 should receive 2 doses of MMR vaccine. People who received inactivated mumps vaccine or an unknown type of mumps vaccine before 1979 and are at high risk for mumps infection should consider immunization with 2 doses of MMR vaccine. For females of childbearing age, rubella immunity should be determined. If there is no evidence of immunity, females who are not pregnant should be vaccinated. If there is no evidence of immunity, females who are pregnant should delay immunization until after pregnancy. Unvaccinated health care workers born before 1957 who lack laboratory evidence of measles, mumps, or rubella immunity or laboratory confirmation of disease should consider measles and mumps immunization with 2 doses of MMR vaccine or rubella immunization with 1 dose of MMR vaccine.  Pneumococcal 13-valent conjugate (PCV13) vaccine. When indicated, a person who is uncertain of her immunization history and has no record of immunization should receive the PCV13 vaccine. An adult aged 19 years or older who has certain medical conditions and has not been previously immunized should receive 1 dose of PCV13 vaccine. This PCV13 should be followed with a dose of pneumococcal polysaccharide (PPSV23) vaccine. The PPSV23 vaccine dose should be obtained at least 8 weeks after the dose of PCV13 vaccine. An adult aged 19  years or older who has certain medical conditions and previously received 1 or more doses of PPSV23 vaccine should receive 1 dose of PCV13. The PCV13 vaccine dose should be obtained 1 or more years after the last PPSV23 vaccine dose.  Pneumococcal polysaccharide (PPSV23) vaccine. When PCV13 is also indicated, PCV13 should be obtained first. All adults aged 65 years and older should be immunized. An adult younger than age 65 years who has certain medical conditions should be immunized. Any person who resides in a nursing home or long-term care facility should be immunized. An adult smoker should be immunized. People with an immunocompromised condition and certain other conditions should receive both PCV13 and PPSV23 vaccines. People with human immunodeficiency virus (HIV) infection should be immunized as soon as possible after diagnosis. Immunization during chemotherapy or radiation therapy should be avoided. Routine use of PPSV23 vaccine is not recommended for American Indians, [place] Natives, or people younger than 65 years unless there are medical conditions that require PPSV23 vaccine. When indicated, people who have unknown immunization and have no record of immunization should receive PPSV23 vaccine. One-time revaccination 5 years after the first dose of PPSV23 is recommended for people aged 19-64 years who have chronic kidney failure, nephrotic syndrome, asplenia, or immunocompromised conditions. People who received 1-2 doses of PPSV23 before age 65 years should receive another dose of PPSV23 vaccine at age 65 years or later if at least 5 years have passed since the previous dose. Doses of PPSV23 are not needed for people immunized with PPSV23 at or after age 65 years.  Meningococcal vaccine. Adults with asplenia or persistent complement component deficiencies should receive 2 doses of quadrivalent meningococcal conjugate (MenACWY-D) vaccine. The doses should be obtained at least 2 months apart.  Microbiologists working with certain meningococcal bacteria, military recruits, people at risk during an outbreak, and people who travel to or live in countries with a high rate of meningitis should be immunized. A first-year college student up through age   21 years who is living in a residence hall should receive a dose if she did not receive a dose on or after her 16th birthday. Adults who have certain high-risk conditions should receive one or more doses of vaccine.  Hepatitis A vaccine. Adults who wish to be protected from this disease, have certain high-risk conditions, work with hepatitis A-infected animals, work in hepatitis A research labs, or travel to or work in countries with a high rate of hepatitis A should be immunized. Adults who were previously unvaccinated and who anticipate close contact with an international adoptee during the first 60 days after arrival in the [place] from a country with a high rate of hepatitis A should be immunized.  Hepatitis B vaccine. Adults who wish to be protected from this disease, have certain high-risk conditions, may be exposed to blood or other infectious body fluids, are household contacts or sex partners of hepatitis B positive people, are clients or workers in certain care facilities, or travel to or work in countries with a high rate of hepatitis B should be immunized.  Haemophilus influenzae type b (Hib) vaccine. A previously unvaccinated person with asplenia or sickle cell disease or having a scheduled splenectomy should receive 1 dose of Hib vaccine. Regardless of previous immunization, a recipient of a hematopoietic stem cell transplant should receive a 3-dose series 6-12 months after her successful transplant. Hib vaccine is not recommended for adults with HIV infection. Preventive Services / Frequency Ages 64 to 68 years  Blood pressure check.** / Every 1 to 2 years.  Lipid and cholesterol check.** / Every 5 years beginning at age  22.  Clinical breast exam.** / Every 3 years for women in their 88s and 53s.  BRCA-related cancer risk assessment.** / For women who have family members with a BRCA-related cancer (breast, ovarian, tubal, or peritoneal cancers).  Pap test.** / Every 2 years from ages 90 through 51. Every 3 years starting at age 21 through age 56 or 3 with a history of 3 consecutive normal Pap tests.  HPV screening.** / Every 3 years from ages 24 through ages 1 to 46 with a history of 3 consecutive normal Pap tests.  Hepatitis C blood test.** / For any individual with known risks for hepatitis C.  Skin self-exam. / Monthly.  Influenza vaccine. / Every year.  Tetanus, diphtheria, and acellular pertussis (Tdap, Td) vaccine.** / Consult your health care provider. Pregnant women should receive 1 dose of Tdap vaccine during each pregnancy. 1 dose of Td every 10 years.  Varicella vaccine.** / Consult your health care provider. Pregnant females who do not have evidence of immunity should receive the first dose after pregnancy.  HPV vaccine. / 3 doses over 6 months, if 72 and younger. The vaccine is not recommended for use in pregnant females. However, pregnancy testing is not needed before receiving a dose.  Measles, mumps, rubella (MMR) vaccine.** / You need at least 1 dose of MMR if you were born in 1957 or later. You may also need a 2nd dose. For females of childbearing age, rubella immunity should be determined. If there is no evidence of immunity, females who are not pregnant should be vaccinated. If there is no evidence of immunity, females who are pregnant should delay immunization until after pregnancy.  Pneumococcal 13-valent conjugate (PCV13) vaccine.** / Consult your health care provider.  Pneumococcal polysaccharide (PPSV23) vaccine.** / 1 to 2 doses if you smoke cigarettes or if you have certain conditions.  Meningococcal vaccine.** /  1 dose if you are age 19 to 21 years and a first-year college  student living in a residence hall, or have one of several medical conditions, you need to get vaccinated against meningococcal disease. You may also need additional booster doses.  Hepatitis A vaccine.** / Consult your health care provider.  Hepatitis B vaccine.** / Consult your health care provider.  Haemophilus influenzae type b (Hib) vaccine.** / Consult your health care provider. Ages 40 to 64 years  Blood pressure check.** / Every 1 to 2 years.  Lipid and cholesterol check.** / Every 5 years beginning at age 20 years.  Lung cancer screening. / Every year if you are aged 55-80 years and have a 30-pack-year history of smoking and currently smoke or have quit within the past 15 years. Yearly screening is stopped once you have quit smoking for at least 15 years or develop a health problem that would prevent you from having lung cancer treatment.  Clinical breast exam.** / Every year after age 40 years.  BRCA-related cancer risk assessment.** / For women who have family members with a BRCA-related cancer (breast, ovarian, tubal, or peritoneal cancers).  Mammogram.** / Every year beginning at age 40 years and continuing for as long as you are in good health. Consult with your health care provider.  Pap test.** / Every 3 years starting at age 30 years through age 65 or 70 years with a history of 3 consecutive normal Pap tests.  HPV screening.** / Every 3 years from ages 30 years through ages 65 to 70 years with a history of 3 consecutive normal Pap tests.  Fecal occult blood test (FOBT) of stool. / Every year beginning at age 50 years and continuing until age 75 years. You may not need to do this test if you get a colonoscopy every 10 years.  Flexible sigmoidoscopy or colonoscopy.** / Every 5 years for a flexible sigmoidoscopy or every 10 years for a colonoscopy beginning at age 50 years and continuing until age 75 years.  Hepatitis C blood test.** / For all people born from 1945 through  1965 and any individual with known risks for hepatitis C.  Skin self-exam. / Monthly.  Influenza vaccine. / Every year.  Tetanus, diphtheria, and acellular pertussis (Tdap/Td) vaccine.** / Consult your health care provider. Pregnant women should receive 1 dose of Tdap vaccine during each pregnancy. 1 dose of Td every 10 years.  Varicella vaccine.** / Consult your health care provider. Pregnant females who do not have evidence of immunity should receive the first dose after pregnancy.  Zoster vaccine.** / 1 dose for adults aged 60 years or older.  Measles, mumps, rubella (MMR) vaccine.** / You need at least 1 dose of MMR if you were born in 1957 or later. You may also need a 2nd dose. For females of childbearing age, rubella immunity should be determined. If there is no evidence of immunity, females who are not pregnant should be vaccinated. If there is no evidence of immunity, females who are pregnant should delay immunization until after pregnancy.  Pneumococcal 13-valent conjugate (PCV13) vaccine.** / Consult your health care provider.  Pneumococcal polysaccharide (PPSV23) vaccine.** / 1 to 2 doses if you smoke cigarettes or if you have certain conditions.  Meningococcal vaccine.** / Consult your health care provider.  Hepatitis A vaccine.** / Consult your health care provider.  Hepatitis B vaccine.** / Consult your health care provider.  Haemophilus influenzae type b (Hib) vaccine.** / Consult your health care provider. Ages 65   years and over  Blood pressure check.** / Every 1 to 2 years.  Lipid and cholesterol check.** / Every 5 years beginning at age 22 years.  Lung cancer screening. / Every year if you are aged 73-80 years and have a 30-pack-year history of smoking and currently smoke or have quit within the past 15 years. Yearly screening is stopped once you have quit smoking for at least 15 years or develop a health problem that would prevent you from having lung cancer  treatment.  Clinical breast exam.** / Every year after age 4 years.  BRCA-related cancer risk assessment.** / For women who have family members with a BRCA-related cancer (breast, ovarian, tubal, or peritoneal cancers).  Mammogram.** / Every year beginning at age 40 years and continuing for as long as you are in good health. Consult with your health care provider.  Pap test.** / Every 3 years starting at age 9 years through age 34 or 91 years with 3 consecutive normal Pap tests. Testing can be stopped between 65 and 70 years with 3 consecutive normal Pap tests and no abnormal Pap or HPV tests in the past 10 years.  HPV screening.** / Every 3 years from ages 57 years through ages 64 or 45 years with a history of 3 consecutive normal Pap tests. Testing can be stopped between 65 and 70 years with 3 consecutive normal Pap tests and no abnormal Pap or HPV tests in the past 10 years.  Fecal occult blood test (FOBT) of stool. / Every year beginning at age 15 years and continuing until age 17 years. You may not need to do this test if you get a colonoscopy every 10 years.  Flexible sigmoidoscopy or colonoscopy.** / Every 5 years for a flexible sigmoidoscopy or every 10 years for a colonoscopy beginning at age 86 years and continuing until age 71 years.  Hepatitis C blood test.** / For all people born from [birth year] through 1967 and any individual with known risks for hepatitis C.  Osteoporosis screening.** / A one-time screening for women ages 83 years and over and women at risk for fractures or osteoporosis.  Skin self-exam. / Monthly.  Influenza vaccine. / Every year.  Tetanus, diphtheria, and acellular pertussis (Tdap/Td) vaccine.** / 1 dose of Td every 10 years.  Varicella vaccine.** / Consult your health care provider.  Zoster vaccine.** / 1 dose for adults aged 61 years or older.  Pneumococcal 13-valent conjugate (PCV13) vaccine.** / Consult your health care provider.  Pneumococcal  polysaccharide (PPSV23) vaccine.** / 1 dose for all adults aged 28 years and older.  Meningococcal vaccine.** / Consult your health care provider.  Hepatitis A vaccine.** / Consult your health care provider.  Hepatitis B vaccine.** / Consult your health care provider.  Haemophilus influenzae type b (Hib) vaccine.** / Consult your health care provider. ** Family history and personal history of risk and conditions may change your health care provider's recommendations. Document Released: 12/16/2001 Document Revised: 03/06/2014 Document Reviewed: 03/17/2011 [facility] Patient Information 2015 [place], [place]. This information is not intended to replace advice given to you by your health care provider. Make sure you discuss any questions you have with your health care provider.

## 2015-04-17 NOTE — Telephone Encounter (Signed)
Signed order for BMD faxed to [facility] with fax cover sheet and confirmation.  Routing to provider for final review. Patient agreeable to disposition. Will close encounter.

## 2015-04-17 NOTE — Telephone Encounter (Signed)
Pt states she spoke with Patty regarding her bone density. Pt called [facility] and her last bone dx was 03/01/2009. Pt requests order sent to [facility] with specific reason as post menopausal for insurance reasons.

## 2015-04-17 NOTE — Progress Notes (Signed)
Subjective:  Patient ID: Maria Montgomery, female    DOB: 1946-02-23  Age: 69 y.o. MRN: [id number]  CC: Hypothyroidism and Annual Exam   HPI Maria Montgomery presents for a CPX and follow up on Maria Montgomery thyroid disease, Maria Montgomery saw Maria Montgomery GYN yesterday, Maria Montgomery feels well and offers no complaints.  Outpatient Prescriptions Prior to Visit  Medication Sig Dispense Refill  . Azelastine HCl (ASTEPRO) 0.15 % SOLN by Nasal route as needed.      Marland Kitchen BEPREVE 1.5 % SOLN Place 1.5 drops into both eyes as needed.    . Biotin 5000 MCG CAPS Take 20,000 mcg by mouth daily.     . Cetirizine HCl (ZYRTEC ALLERGY) 10 MG CAPS Take by mouth as needed.     . cholecalciferol (VITAMIN D) 1000 UNITS tablet Take 1,000 Units by mouth daily.    Marland Kitchen levothyroxine (SYNTHROID, LEVOTHROID) 50 MCG tablet Take 1 tablet (50 mcg total) by mouth daily before breakfast. 30 tablet 0  . Multiple Vitamins-Minerals (CENTRUM SILVER PO) Take by mouth daily.      . Probiotic Product (PROBIOTIC DAILY PO) Take by mouth.    . ranitidine (ZANTAC) 150 MG tablet Take 150 mg by mouth as needed.      . zolpidem (AMBIEN) 10 MG tablet Take 1 tablet (10 mg total) by mouth at bedtime as needed. for sleep 30 tablet 5  . DYMISTA 137-50 MCG/ACT SUSP Take 137 mcg by mouth as needed.      No facility-administered medications prior to visit.    ROS Review of Systems  Constitutional: Negative.  Negative for fever, chills, diaphoresis, appetite change and fatigue.  HENT: Negative.  Negative for congestion, trouble swallowing and voice change.   Eyes: Negative.   Respiratory: Negative.  Negative for apnea, cough, choking, chest tightness, shortness of breath, wheezing and stridor.   Cardiovascular: Negative.  Negative for chest pain, palpitations and leg swelling.  Gastrointestinal: Negative.  Negative for nausea, vomiting, abdominal pain, diarrhea, constipation and blood in stool.  Endocrine: Negative.   Genitourinary: Negative.   Musculoskeletal: Negative.  Negative  for myalgias, back pain, joint swelling and arthralgias.  Skin: Negative.  Negative for rash.  Allergic/Immunologic: Negative.   Neurological: Negative.  Negative for dizziness, tremors, syncope, speech difficulty, light-headedness, numbness and headaches.  Hematological: Negative.  Negative for adenopathy. Does not bruise/bleed easily.  Psychiatric/Behavioral: Negative.     Objective:  BP 122/82 mmHg  Pulse 68  Temp(Src) 97.8 F (36.6 C) (Oral)  Resp 16  Ht 4' 11.5" (1.511 m)  Wt 146 lb 12 oz (66.565 kg)  BMI 29.16 kg/m2  SpO2 97%  LMP 07/28/1987  BP Readings from Last 3 Encounters:  04/17/15 122/82  04/16/15 110/60  03/02/15 126/76    Wt Readings from Last 3 Encounters:  04/17/15 146 lb 12 oz (66.565 kg)  04/16/15 146 lb (66.225 kg)  04/12/14 144 lb (65.318 kg)    Physical Exam  Constitutional: Maria Montgomery is oriented to person, place, and time. Maria Montgomery appears well-developed and well-nourished. No distress.  HENT:  Head: Normocephalic and atraumatic.  Mouth/Throat: Oropharynx is clear and moist. No oropharyngeal exudate.  Eyes: Conjunctivae are normal. Right eye exhibits no discharge. Left eye exhibits no discharge. No scleral icterus.  Neck: Normal range of motion. Neck supple. No JVD present. No tracheal deviation present. No thyromegaly present.  Cardiovascular: Normal rate, regular rhythm, normal heart sounds and intact distal pulses.  Exam reveals no gallop and no friction rub.   No murmur heard.  Pulmonary/Chest: Effort normal and breath sounds normal. No stridor. No respiratory distress. Maria Montgomery has no wheezes. Maria Montgomery has no rales. Maria Montgomery exhibits no tenderness.  Abdominal: Soft. Bowel sounds are normal. Maria Montgomery exhibits no distension and no mass. There is no tenderness. There is no rebound and no guarding.  Musculoskeletal: Normal range of motion. Maria Montgomery exhibits no edema or tenderness.  Lymphadenopathy:    Maria Montgomery has no cervical adenopathy.  Neurological: Maria Montgomery is oriented to person, place,  and time.  Skin: Skin is warm and dry. No rash noted. Maria Montgomery is not diaphoretic. No erythema. No pallor.  Psychiatric: Maria Montgomery has a normal mood and affect. Maria Montgomery behavior is normal. Judgment and thought content normal.  Vitals reviewed.   Lab Results  Component Value Date   WBC 6.2 04/17/2015   HGB 14.2 04/17/2015   HCT 42.7 04/17/2015   PLT 235.0 04/17/2015   GLUCOSE 93 04/17/2015   CHOL 203* 04/17/2015   TRIG 96.0 04/17/2015   HDL 77.90 04/17/2015   LDLDIRECT 97.6 02/05/2009   LDLCALC 106* 04/17/2015   ALT 18 04/17/2015   AST 21 04/17/2015   NA 138 04/17/2015   K 4.4 04/17/2015   CL 104 04/17/2015   CREATININE 0.86 04/17/2015   BUN 19 04/17/2015   CO2 26 04/17/2015   TSH 3.94 04/17/2015    No results found.  Assessment & Plan:   Maria Montgomery was seen today for hypothyroidism and annual exam.  Diagnoses and all orders for this visit:  Other specified hypothyroidism - Maria Montgomery TSh is in the normal range, will remain on the current dose Orders: -     Lipid panel; Future -     Comprehensive metabolic panel; Future -     CBC with Differential/Platelet; Future -     TSH; Future  Osteopenia, senile - Maria Montgomery is due for a DEXA scan Orders: -     CBC with Differential/Platelet; Future -     Urinalysis, Routine w reflex microscopic (not at [facility]); Future -     Vit D  25 hydroxy (rtn osteoporosis monitoring); Future -     DG Bone Density; Future   I have discontinued Maria Montgomery's DYMISTA. I am also having Maria Montgomery maintain Maria Montgomery Azelastine HCl, Biotin, Multiple Vitamins-Minerals (CENTRUM SILVER PO), ranitidine, Cetirizine HCl, BEPREVE, cholecalciferol, Probiotic Product (PROBIOTIC DAILY PO), levothyroxine, and zolpidem.  No orders of the defined types were placed in this encounter.     Follow-up: Return in about 4 months (around 08/17/2015).  Scarlette Calico, MD

## 2015-04-17 NOTE — Telephone Encounter (Signed)
Bone Density order for [facility] to Milford Cage, FNP for review and signature before fax.

## 2015-04-19 DIAGNOSIS — J3089 Other allergic rhinitis: Secondary | ICD-10-CM | POA: Diagnosis not present

## 2015-04-19 DIAGNOSIS — J301 Allergic rhinitis due to pollen: Secondary | ICD-10-CM | POA: Diagnosis not present

## 2015-04-23 DIAGNOSIS — Z780 Asymptomatic menopausal state: Secondary | ICD-10-CM | POA: Diagnosis not present

## 2015-04-23 LAB — HM DEXA SCAN

## 2015-04-24 DIAGNOSIS — J301 Allergic rhinitis due to pollen: Secondary | ICD-10-CM | POA: Diagnosis not present

## 2015-04-24 DIAGNOSIS — J3089 Other allergic rhinitis: Secondary | ICD-10-CM | POA: Diagnosis not present

## 2015-04-27 ENCOUNTER — Other Ambulatory Visit: Payer: Self-pay | Admitting: Internal Medicine

## 2015-04-30 ENCOUNTER — Telehealth: Payer: Self-pay | Admitting: Nurse Practitioner

## 2015-04-30 DIAGNOSIS — J3089 Other allergic rhinitis: Secondary | ICD-10-CM | POA: Diagnosis not present

## 2015-04-30 DIAGNOSIS — J301 Allergic rhinitis due to pollen: Secondary | ICD-10-CM | POA: Diagnosis not present

## 2015-04-30 NOTE — Telephone Encounter (Signed)
Please let pt. Know that BMD done 04/23/15 shows a T Score at the spine -0.8; left hip neck -0.6; right hip neck -0.9.  These all fall into the normal range.  The comparison from 2010 shows a 3.3% loss at the spine and the hips were stable.  The FRAX score for major fracture in 10 years is 8.6% (goal is <20%); the FRAX score for hip fracture is 0.9% (goal is < 3%).  While some bone loss is normal and expectant, we do not want to see more loss.  Must do upper body exercise with weights to help the spine along with walking, Vit D, calcium.  Repeat in 2 years to check stability.

## 2015-04-30 NOTE — Addendum Note (Signed)
Addended by: Janith Lima on: 04/30/2015 03:41 PM   Modules accepted: Miquel Dunn

## 2015-05-02 NOTE — Telephone Encounter (Signed)
I have attempted to contact this patient by phone with the following results: left message to return my call on answering machine (mobile).  Advised call is regarding recent bone density test.  [phone] (Home)

## 2015-05-04 NOTE — Telephone Encounter (Signed)
I have attempted to contact this patient by phone with the following results: left message to return my call on answering machine (mobile). Advised call is regarding recent bone density test. advised I will be out of the office next week and to ask for triage to review labs.  [phone] (Home)

## 2015-05-14 ENCOUNTER — Ambulatory Visit (INDEPENDENT_AMBULATORY_CARE_PROVIDER_SITE_OTHER): Payer: Medicare Other | Admitting: Internal Medicine

## 2015-05-14 ENCOUNTER — Ambulatory Visit (INDEPENDENT_AMBULATORY_CARE_PROVIDER_SITE_OTHER)
Admission: RE | Admit: 2015-05-14 | Discharge: 2015-05-14 | Disposition: A | Discharge status: Home / Self Care | Payer: Medicare Other | Source: Ambulatory Visit | Attending: Internal Medicine | Admitting: Internal Medicine

## 2015-05-14 ENCOUNTER — Other Ambulatory Visit (INDEPENDENT_AMBULATORY_CARE_PROVIDER_SITE_OTHER): Payer: Medicare Other

## 2015-05-14 VITALS — BP 124/68 | HR 74 | Temp 97.8°F | Resp 16 | Ht 59.5 in | Wt 149.0 lb

## 2015-05-14 DIAGNOSIS — M659 Synovitis and tenosynovitis, unspecified: Secondary | ICD-10-CM

## 2015-05-14 DIAGNOSIS — M79641 Pain in right hand: Secondary | ICD-10-CM | POA: Diagnosis not present

## 2015-05-14 DIAGNOSIS — M79642 Pain in left hand: Secondary | ICD-10-CM

## 2015-05-14 DIAGNOSIS — M19041 Primary osteoarthritis, right hand: Secondary | ICD-10-CM | POA: Diagnosis not present

## 2015-05-14 DIAGNOSIS — M65949 Unspecified synovitis and tenosynovitis, unspecified hand: Secondary | ICD-10-CM | POA: Insufficient documentation

## 2015-05-14 DIAGNOSIS — M6588 Other synovitis and tenosynovitis, other site: Secondary | ICD-10-CM

## 2015-05-14 DIAGNOSIS — M79609 Pain in unspecified limb: Secondary | ICD-10-CM | POA: Diagnosis not present

## 2015-05-14 DIAGNOSIS — M19042 Primary osteoarthritis, left hand: Secondary | ICD-10-CM | POA: Diagnosis not present

## 2015-05-14 LAB — COMPREHENSIVE METABOLIC PANEL
ALK PHOS: 59 U/L (ref 39–117)
ALT: 18 U/L (ref 0–35)
AST: 21 U/L (ref 0–37)
Albumin: 4.2 g/dL (ref 3.5–5.2)
BUN: 20 mg/dL (ref 6–23)
CHLORIDE: 103 meq/L (ref 96–112)
CO2: 28 meq/L (ref 19–32)
Calcium: 9.2 mg/dL (ref 8.4–10.5)
Creatinine, Ser: 0.87 mg/dL (ref 0.40–1.20)
GFR: 68.16 mL/min (ref >60.00)
Glucose, Bld: 82 mg/dL (ref 70–99)
POTASSIUM: 4 meq/L (ref 3.5–5.1)
SODIUM: 138 meq/L (ref 135–145)
TOTAL PROTEIN: 7 g/dL (ref 6.0–8.3)
Total Bilirubin: 0.3 mg/dL (ref 0.2–1.2)

## 2015-05-14 LAB — CBC WITH DIFFERENTIAL/PLATELET
Basophils Absolute: 0.1 10*3/uL (ref 0.0–0.1)
Basophils Relative: 1.2 % (ref 0.0–3.0)
Eosinophils Absolute: 0.1 10*3/uL (ref 0.0–0.7)
Eosinophils Relative: 1.6 % (ref 0.0–5.0)
HCT: 39.4 % (ref 36.0–46.0)
HEMOGLOBIN: 13.3 g/dL (ref 12.0–15.0)
LYMPHS ABS: 1.6 10*3/uL (ref 0.7–4.0)
Lymphocytes Relative: 24.2 % (ref 12.0–46.0)
MCHC: 33.8 g/dL (ref 30.0–36.0)
MCV: 91.7 fl (ref 78.0–100.0)
MONOS PCT: 11.8 % (ref 3.0–12.0)
Monocytes Absolute: 0.8 10*3/uL (ref 0.1–1.0)
Neutro Abs: 4.1 10*3/uL (ref 1.4–7.7)
Neutrophils Relative %: 61.2 % (ref 43.0–77.0)
Platelets: 277 10*3/uL (ref 150.0–400.0)
RBC: 4.3 Mil/uL (ref 3.87–5.11)
RDW: 12.8 % (ref 11.5–15.5)
WBC: 6.7 10*3/uL (ref 4.0–10.5)

## 2015-05-14 LAB — C-REACTIVE PROTEIN: CRP: 0.3 mg/dL — ABNORMAL LOW (ref 0.5–20.0)

## 2015-05-14 MED ORDER — METHYLPREDNISOLONE 4 MG PO TBPK: ORAL_TABLET | ORAL | Status: DC

## 2015-05-14 NOTE — Telephone Encounter (Signed)
Patient returning call.

## 2015-05-14 NOTE — Progress Notes (Signed)
Pre visit review using our clinic review tool, if applicable. No additional management support is needed unless otherwise documented below in the visit note. 

## 2015-05-14 NOTE — Patient Instructions (Signed)

## 2015-05-15 ENCOUNTER — Encounter: Payer: Self-pay | Admitting: Internal Medicine

## 2015-05-15 DIAGNOSIS — J301 Allergic rhinitis due to pollen: Secondary | ICD-10-CM | POA: Diagnosis not present

## 2015-05-15 DIAGNOSIS — J3089 Other allergic rhinitis: Secondary | ICD-10-CM | POA: Diagnosis not present

## 2015-05-15 LAB — SEDIMENTATION RATE: SED RATE: 11 mm/h (ref 0–22)

## 2015-05-15 LAB — RHEUMATOID FACTOR: Rheumatoid fact SerPl-aCnc: <10 [IU]/mL

## 2015-05-15 LAB — LYME AB/WESTERN BLOT REFLEX: B burgdorferi Ab IgG+IgM: 0.2 {ISR}

## 2015-05-15 LAB — ANA: Anti Nuclear Antibody(ANA): NEGATIVE

## 2015-05-15 NOTE — Telephone Encounter (Signed)
Patient returning call.

## 2015-05-16 ENCOUNTER — Encounter: Payer: Self-pay | Admitting: Internal Medicine

## 2015-05-16 LAB — B. BURGDORFI ANTIBODIES BY WB
B burgdorferi IgG Abs (IB): NEGATIVE
B burgdorferi IgM Abs (IB): NEGATIVE

## 2015-05-16 LAB — CYCLIC CITRUL PEPTIDE ANTIBODY, IGG: Cyclic Citrullin Peptide Ab: <2 U/mL (ref 0.0–5.0)

## 2015-05-16 NOTE — Progress Notes (Signed)
Subjective:  Patient ID: Maria Montgomery, female    DOB: 01-22-1946  Age: 69 y.o. MRN: [id number]  CC: Hand Pain   HPI FRANCI OSHANA presents for the complaint of bilateral hand pain for about 1 week. She was at the beach with her [place] and did a lot of activities with her hands. She complains of pain, redness and swelling, symmetrically in both hands over the MCP joints. She has been taking anti-inflammatories with some relief from her symptoms. None of her other joints are bothering her and she does not offer any other symptoms.  Outpatient Prescriptions Prior to Visit  Medication Sig Dispense Refill  . Azelastine HCl (ASTEPRO) 0.15 % SOLN by Nasal route as needed.      Marland Kitchen BEPREVE 1.5 % SOLN Place 1.5 drops into both eyes as needed.    . Biotin 5000 MCG CAPS Take 20,000 mcg by mouth daily.     . Cetirizine HCl (ZYRTEC ALLERGY) 10 MG CAPS Take by mouth as needed.     . cholecalciferol (VITAMIN D) 1000 UNITS tablet Take 1,000 Units by mouth daily.    Marland Kitchen levothyroxine (SYNTHROID, LEVOTHROID) 50 MCG tablet TAKE 1 TABLET BY MOUTH EVERY DAY BEFORE BREAKFAST 30 tablet 5  . Multiple Vitamins-Minerals (CENTRUM SILVER PO) Take by mouth daily.      . Probiotic Product (PROBIOTIC DAILY PO) Take by mouth.    . ranitidine (ZANTAC) 150 MG tablet Take 150 mg by mouth as needed.      . zolpidem (AMBIEN) 10 MG tablet Take 1 tablet (10 mg total) by mouth at bedtime as needed. for sleep 30 tablet 5   No facility-administered medications prior to visit.    ROS Review of Systems  Constitutional: Negative.  Negative for fever, chills, diaphoresis, appetite change and fatigue.  HENT: Negative.  Negative for sore throat, trouble swallowing and voice change.   Eyes: Negative.  Negative for visual disturbance.  Respiratory: Negative.  Negative for cough, choking, chest tightness, shortness of breath and stridor.   Cardiovascular: Negative.  Negative for chest pain, palpitations and leg swelling.    Gastrointestinal: Negative.  Negative for nausea, vomiting, abdominal pain, diarrhea, constipation and blood in stool.  Endocrine: Negative.   Genitourinary: Negative.  Negative for dysuria, hematuria and difficulty urinating.  Musculoskeletal: Positive for arthralgias. Negative for myalgias, back pain, joint swelling, gait problem, neck pain and neck stiffness.  Skin: Negative.  Negative for color change, pallor and rash.  Allergic/Immunologic: Negative.   Neurological: Negative.  Negative for dizziness, tremors, weakness, light-headedness and numbness.  Hematological: Negative.  Negative for adenopathy. Does not bruise/bleed easily.  Psychiatric/Behavioral: Negative.     Objective:  BP 124/68 mmHg  Pulse 74  Ht 4' 11.5" (1.511 m)  Wt 149 lb (67.586 kg)  BMI 29.60 kg/m2  SpO2 98%  LMP 07/28/1987  BP Readings from Last 3 Encounters:  05/14/15 124/68  04/17/15 122/82  04/16/15 110/60    Wt Readings from Last 3 Encounters:  05/14/15 149 lb (67.586 kg)  04/17/15 146 lb 12 oz (66.565 kg)  04/16/15 146 lb (66.225 kg)    Physical Exam  Constitutional: She is oriented to person, place, and time.  Non-toxic appearance. She does not have a sickly appearance. She does not appear ill. No distress.  HENT:  Mouth/Throat: Oropharynx is clear and moist. No oropharyngeal exudate.  Eyes: Conjunctivae are normal. Right eye exhibits no discharge. Left eye exhibits no discharge. No scleral icterus.  Neck: Normal range of motion.  Neck supple. No JVD present. No tracheal deviation present. No thyromegaly present.  Cardiovascular: Normal rate, regular rhythm, normal heart sounds and intact distal pulses.  Exam reveals no gallop and no friction rub.   No murmur heard. Pulmonary/Chest: Effort normal and breath sounds normal. No stridor. No respiratory distress. She has no wheezes. She has no rales. She exhibits no tenderness.  Abdominal: Soft. Bowel sounds are normal. She exhibits no distension and  no mass. There is no tenderness. There is no rebound and no guarding.  Musculoskeletal: Normal range of motion. She exhibits no edema or tenderness.       Right hand: She exhibits swelling. She exhibits normal range of motion, no bony tenderness, normal two-point discrimination, no deformity and no laceration. Normal sensation noted. Normal strength noted.       Left hand: She exhibits swelling. She exhibits normal range of motion, no bony tenderness, normal two-point discrimination, normal capillary refill, no deformity and no laceration. Normal sensation noted. Normal strength noted.  There is a symmetrical synovitis over both hands over the dorsum of the MCP joinst as evidenced by mild swelling and tenderness to palpation. There are no effusions and the skin is normal.  Lymphadenopathy:    She has no cervical adenopathy.  Neurological: She is oriented to person, place, and time.  Skin: Skin is warm and dry. No rash noted. She is not diaphoretic. No erythema. No pallor.    Lab Results  Component Value Date   WBC 6.7 05/14/2015   HGB 13.3 05/14/2015   HCT 39.4 05/14/2015   PLT 277.0 05/14/2015   GLUCOSE 82 05/14/2015   CHOL 203* 04/17/2015   TRIG 96.0 04/17/2015   HDL 77.90 04/17/2015   LDLDIRECT 97.6 02/05/2009   LDLCALC 106* 04/17/2015   ALT 18 05/14/2015   AST 21 05/14/2015   NA 138 05/14/2015   K 4.0 05/14/2015   CL 103 05/14/2015   CREATININE 0.87 05/14/2015   BUN 20 05/14/2015   CO2 28 05/14/2015   TSH 3.94 04/17/2015    Dg Hand Complete Left  05/15/2015   CLINICAL DATA:  Carpal/metacarpal joint pain, redness, and swelling for 5 days.  EXAM: LEFT HAND - COMPLETE 3+ VIEW  COMPARISON:  None.  FINDINGS: There is no evidence of fracture or dislocation. There is severe degenerative joint changes of the first carpal/metacarpal joint with narrowed joint space and osteophyte formation. There are osteophyte formation with narrowed joint space of the second and third distal  interphalangeal joint consistent with osteoarthritis. Soft tissues are unremarkable.  IMPRESSION: No acute fracture or dislocation.  Osteoarthritic changes.   Electronically Signed   By: Abelardo Diesel M.D.   On: 05/15/2015 08:17   Dg Hand Complete Right  05/15/2015   CLINICAL DATA:  Right hand pain.  EXAM: RIGHT HAND - COMPLETE 3+ VIEW  COMPARISON:  None.  FINDINGS: Moderate DIP and PIP joint osteoarthritis with joint space narrowing and early spurring. There is also mild joint space narrowing at the metacarpal phalangeal joints but no obvious erosive findings. Advanced degenerative changes at the carpometacarpal joint of thumb. The intercarpal joint spaces are fairly well maintained in the radiocarpal joint is normal.  IMPRESSION: Osteoarthritic type degenerative changes involving the DIP and PIP joints of the fingers and the carpometacarpal joint of the thumb.  No definite findings for an erosive arthropathy.   Electronically Signed   By: Marijo Sanes M.D.   On: 05/15/2015 08:20    Assessment & Plan:   Clio was  seen today for hand pain.  Diagnoses and all orders for this visit:  Bilateral hand pain - her symptoms, physical examination and lab work as well as x-rays are all consistent with a flare of degenerative joint disease in the MCP joints. She will continue anti-inflammatories and I will offer her a Medrol Dosepak for additional symptom relief. Orders: -     DG Hand Complete Left; Future -     DG Hand Complete Right; Future -     Comprehensive metabolic panel; Future -     CBC with Differential/Platelet; Future -     Cyclic citrul peptide antibody, IgG; Future -     Rheumatoid factor; Future -     Sedimentation rate; Future -     B. burgdorfi antibodies by WB; Future -     Lyme Ab/Western Blot Reflex; Future -     ANA; Future -     C-reactive protein; Future -     methylPREDNISolone (MEDROL DOSEPAK) 4 MG TBPK tablet; TAKE AS DIRECTED  Synovitis of hand - her plain films are  positive only for degenerative joint disease. The inflammatory markers in her lab work are all normal. Will check a Lyme titer as well to screen for Lyme disease. Will treat with a Medrol Dosepak. Orders: -     DG Hand Complete Left; Future -     DG Hand Complete Right; Future -     Comprehensive metabolic panel; Future -     CBC with Differential/Platelet; Future -     Cyclic citrul peptide antibody, IgG; Future -     Rheumatoid factor; Future -     Sedimentation rate; Future -     B. burgdorfi antibodies by WB; Future -     Lyme Ab/Western Blot Reflex; Future -     ANA; Future -     C-reactive protein; Future -     methylPREDNISolone (MEDROL DOSEPAK) 4 MG TBPK tablet; TAKE AS DIRECTED   I am having Ms. Blick start on methylPREDNISolone. I am also having her maintain her Azelastine HCl, Biotin, Multiple Vitamins-Minerals (CENTRUM SILVER PO), ranitidine, Cetirizine HCl, BEPREVE, cholecalciferol, Probiotic Product (PROBIOTIC DAILY PO), zolpidem, and levothyroxine.  Meds ordered this encounter  Medications  . methylPREDNISolone (MEDROL DOSEPAK) 4 MG TBPK tablet    Sig: TAKE AS DIRECTED    Dispense:  21 tablet    Refill:  0     Follow-up: Return in about 3 weeks (around 06/04/2015).  Scarlette Calico, MD

## 2015-05-24 NOTE — Telephone Encounter (Signed)
Notified pt of results.  Pt states she has seen PCP in interim and was given results by him.  Reviewed results and recommendations per patient request.  Calcium guidelines reviewed with patient.  Pt states Dr. Ronnald Ramp (PCP) recommended increase of Vit D to 5000 IU daily due to last results in 04/2015.  Medication list updated.

## 2015-06-01 DIAGNOSIS — J301 Allergic rhinitis due to pollen: Secondary | ICD-10-CM | POA: Diagnosis not present

## 2015-06-01 DIAGNOSIS — J3089 Other allergic rhinitis: Secondary | ICD-10-CM | POA: Diagnosis not present

## 2015-06-04 ENCOUNTER — Telehealth: Payer: Self-pay | Admitting: *Deleted

## 2015-06-04 NOTE — Telephone Encounter (Signed)
"  I've seen Dr. Paulla Dolly twice about my small toe on my right foot.  He wanted me to schedule times to have surgery but I also have some questions.  I think I need to come in to see him again.  I am wondering how to schedule.  You cannot reach me from 11:15am to 11:30am, after that you can reach me until 1:30pm

## 2015-06-05 NOTE — Telephone Encounter (Signed)
I left patient a message to call and schedule a consultation with Dr. Paulla Dolly.

## 2015-06-11 ENCOUNTER — Ambulatory Visit (INDEPENDENT_AMBULATORY_CARE_PROVIDER_SITE_OTHER): Payer: Medicare Other | Admitting: Podiatry

## 2015-06-11 ENCOUNTER — Encounter: Payer: Self-pay | Admitting: Podiatry

## 2015-06-11 VITALS — BP 136/85 | HR 63 | Resp 16

## 2015-06-11 DIAGNOSIS — M2041 Other hammer toe(s) (acquired), right foot: Secondary | ICD-10-CM | POA: Diagnosis not present

## 2015-06-11 DIAGNOSIS — M779 Enthesopathy, unspecified: Secondary | ICD-10-CM

## 2015-06-11 DIAGNOSIS — L84 Corns and callosities: Secondary | ICD-10-CM | POA: Diagnosis not present

## 2015-06-11 NOTE — Patient Instructions (Signed)
Pre-Operative Instructions  Congratulations, you have decided to take an important step to improving your quality of life.  You can be assured that the doctors of [facility] will be with you every step of the way.  1. Plan to be at the surgery center/hospital at least 1 (one) hour prior to your scheduled time unless otherwise directed by the surgical center/hospital staff.  You must have a responsible adult accompany you, remain during the surgery and drive you home.  Make sure you have directions to the surgical center/hospital and know how to get there on time. 2. For hospital based surgery you will need to obtain a history and physical form from your family physician within 1 month prior to the date of surgery- we will give you a form for you primary physician.  3. We make every effort to accommodate the date you request for surgery.  There are however, times where surgery dates or times have to be moved.  We will contact you as soon as possible if a change in schedule is required.   4. No Aspirin/Ibuprofen for one week before surgery.  If you are on aspirin, any non-steroidal anti-inflammatory medications (Mobic, Aleve, Ibuprofen) you should stop taking it 7 days prior to your surgery.  You make take Tylenol  For pain prior to surgery.  5. Medications- If you are taking daily heart and blood pressure medications, seizure, reflux, allergy, asthma, anxiety, pain or diabetes medications, make sure the surgery center/hospital is aware before the day of surgery so they may notify you which medications to take or avoid the day of surgery. 6. No food or drink after midnight the night before surgery unless directed otherwise by surgical center/hospital staff. 7. No alcoholic beverages 24 hours prior to surgery.  No smoking 24 hours prior to or 24 hours after surgery. 8. Wear loose pants or shorts- loose enough to fit over bandages, boots, and casts. 9. No slip on shoes, sneakers are best. 10. Bring  your boot with you to the surgery center/hospital.  Also bring crutches or a walker if your physician has prescribed it for you.  If you do not have this equipment, it will be provided for you after surgery. 11. If you have not been contracted by the surgery center/hospital by the day before your surgery, call to confirm the date and time of your surgery. 12. Leave-time from work may vary depending on the type of surgery you have.  Appropriate arrangements should be made prior to surgery with your employer. 13. Prescriptions will be provided immediately following surgery by your doctor.  Have these filled as soon as possible after surgery and take the medication as directed. 14. Remove nail polish on the operative foot. 15. Wash the night before surgery.  The night before surgery wash the foot and leg well with the antibacterial soap provided and water paying special attention to beneath the toenails and in between the toes.  Rinse thoroughly with water and dry well with a towel.  Perform this wash unless told not to do so by your physician.  Enclosed: 1 Ice pack (please put in freezer the night before surgery)   1 Hibiclens skin cleaner   Pre-op Instructions  If you have any questions regarding the instructions, do not hesitate to call our office.  [place]: [address] [place], [place] [address] [phone]  [place]: [address] [place], [place] [address] [phone]  [place]: [address]  [place], [place] [address] [phone]  Dr. Richard   Tuchman DPM, Dr. Norman Regal DPM Dr. Richard Sikora DPM, Dr. M. Todd Hyatt DPM, Dr. Kathryn Egerton DPM 

## 2015-06-12 DIAGNOSIS — J3089 Other allergic rhinitis: Secondary | ICD-10-CM | POA: Diagnosis not present

## 2015-06-12 DIAGNOSIS — J301 Allergic rhinitis due to pollen: Secondary | ICD-10-CM | POA: Diagnosis not present

## 2015-06-12 NOTE — Progress Notes (Signed)
Subjective:     Patient ID: Maria Montgomery, female   DOB: [birth year], 69 y.o.   MRN: [id number]  HPI patient presents stating I'm having a lot of pain between the fourth and fifth toes on my right foot and I know I need to get it fixed and I have been somewhat better some not wearing shoes but it still sore   Review of Systems     Objective:   Physical Exam  neurovascular status intact muscle strength adequate range of motion within normal limits with patient noted to have interspace lesion fourth interspace right foot with enlargement of the head of the proximal phalanx fifth toe right and pain when I palpated into the interspace itself    Assessment:      chronic interspace lesion fourth right with enlargement had a proximal phalanx as part of the pathology    Plan:      reviewed condition and we discussed the treatment options we have tried up to this point which have not been successful and surgical intervention is recommended. Patient wants to have this done and I explained the surgery consisting of arthroplasty fifth toe right along with partial soft tissue syndactylization and she wants the procedure and after extensive review of consent form reviewing alternative treatments and complications she signs consent form. She understands that total Recovery will be approximately 6 months to one year and that there is no long-term guarantees as far as success. Signed consent form and is scheduled for outpatient surgery

## 2015-06-19 DIAGNOSIS — J3089 Other allergic rhinitis: Secondary | ICD-10-CM | POA: Diagnosis not present

## 2015-06-19 DIAGNOSIS — J301 Allergic rhinitis due to pollen: Secondary | ICD-10-CM | POA: Diagnosis not present

## 2015-07-02 DIAGNOSIS — J3089 Other allergic rhinitis: Secondary | ICD-10-CM | POA: Diagnosis not present

## 2015-07-02 DIAGNOSIS — J301 Allergic rhinitis due to pollen: Secondary | ICD-10-CM | POA: Diagnosis not present

## 2015-07-03 ENCOUNTER — Encounter: Payer: Self-pay | Admitting: *Deleted

## 2015-07-03 DIAGNOSIS — E039 Hypothyroidism, unspecified: Secondary | ICD-10-CM | POA: Diagnosis not present

## 2015-07-03 DIAGNOSIS — M2041 Other hammer toe(s) (acquired), right foot: Secondary | ICD-10-CM | POA: Diagnosis not present

## 2015-07-03 DIAGNOSIS — D492 Neoplasm of unspecified behavior of bone, soft tissue, and skin: Secondary | ICD-10-CM | POA: Diagnosis not present

## 2015-07-03 DIAGNOSIS — L570 Actinic keratosis: Secondary | ICD-10-CM | POA: Diagnosis not present

## 2015-07-06 DIAGNOSIS — J301 Allergic rhinitis due to pollen: Secondary | ICD-10-CM | POA: Diagnosis not present

## 2015-07-10 ENCOUNTER — Other Ambulatory Visit: Payer: Self-pay | Admitting: Nurse Practitioner

## 2015-07-11 NOTE — Progress Notes (Signed)
Surgery performed at [facility].  Prescription was written for Vicodin 10/325.

## 2015-07-12 ENCOUNTER — Encounter: Payer: Self-pay | Admitting: Podiatry

## 2015-07-12 ENCOUNTER — Ambulatory Visit (INDEPENDENT_AMBULATORY_CARE_PROVIDER_SITE_OTHER): Payer: Medicare Other | Admitting: Podiatry

## 2015-07-12 ENCOUNTER — Other Ambulatory Visit: Payer: Self-pay

## 2015-07-12 ENCOUNTER — Ambulatory Visit (INDEPENDENT_AMBULATORY_CARE_PROVIDER_SITE_OTHER): Payer: Medicare Other

## 2015-07-12 VITALS — BP 117/72 | HR 76 | Temp 96.9°F | Resp 16

## 2015-07-12 DIAGNOSIS — Z9889 Other specified postprocedural states: Secondary | ICD-10-CM

## 2015-07-12 DIAGNOSIS — M2041 Other hammer toe(s) (acquired), right foot: Secondary | ICD-10-CM

## 2015-07-12 DIAGNOSIS — J301 Allergic rhinitis due to pollen: Secondary | ICD-10-CM | POA: Diagnosis not present

## 2015-07-12 DIAGNOSIS — J3089 Other allergic rhinitis: Secondary | ICD-10-CM | POA: Diagnosis not present

## 2015-07-12 DIAGNOSIS — R05 Cough: Secondary | ICD-10-CM | POA: Diagnosis not present

## 2015-07-12 DIAGNOSIS — H1045 Other chronic allergic conjunctivitis: Secondary | ICD-10-CM | POA: Diagnosis not present

## 2015-07-12 NOTE — Progress Notes (Signed)
Subjective:     Patient ID: Maria Montgomery, female   DOB: 07-31-1946, 69 y.o.   MRN: [id number]  HPI patient presents stating she's doing great with her fourth and fifth toes right foot   Review of Systems     Objective:   Physical Exam Neurovascular status intact with incision site right fourth interspace that's doing very well with wound edges well coapted and no indications of drainage or other issues    Assessment:     Doing well after partial soft tissue syndactylization fourth interspace right    Plan:     Applied sterile dressing advised on continued reduced activity elevation and reappoint 2 weeks for suture removal or earlier if necessary and again reviewed x-rays with patient

## 2015-07-24 DIAGNOSIS — J3089 Other allergic rhinitis: Secondary | ICD-10-CM | POA: Diagnosis not present

## 2015-07-24 DIAGNOSIS — J301 Allergic rhinitis due to pollen: Secondary | ICD-10-CM | POA: Diagnosis not present

## 2015-07-27 ENCOUNTER — Ambulatory Visit (INDEPENDENT_AMBULATORY_CARE_PROVIDER_SITE_OTHER): Payer: Medicare Other | Admitting: Podiatry

## 2015-07-27 VITALS — BP 105/66 | HR 63 | Resp 16

## 2015-07-27 DIAGNOSIS — Z9889 Other specified postprocedural states: Secondary | ICD-10-CM

## 2015-07-27 DIAGNOSIS — M2041 Other hammer toe(s) (acquired), right foot: Secondary | ICD-10-CM | POA: Diagnosis not present

## 2015-07-27 NOTE — Patient Instructions (Signed)

## 2015-07-29 NOTE — Progress Notes (Signed)
Subjective:     Patient ID: Maria Montgomery, female   DOB: [birth year], 69 y.o.   MRN: [id number]  HPI patient states I'm doing fine with minimal discomfort   Review of Systems     Objective:   Physical Exam Neurovascular status intact with wound edges well coapted between the fourth and fifth toes on my right foot    Assessment:     Doing well post syndactylization arthroplasty procedure right foot    Plan:     Stitches removed wound edges well coapted well and instructed on gradual increase in activity and shoe gear and reappoint if any issues should occur

## 2015-07-30 DIAGNOSIS — H43812 Vitreous degeneration, left eye: Secondary | ICD-10-CM | POA: Diagnosis not present

## 2015-07-31 DIAGNOSIS — J3089 Other allergic rhinitis: Secondary | ICD-10-CM | POA: Diagnosis not present

## 2015-08-07 DIAGNOSIS — J3089 Other allergic rhinitis: Secondary | ICD-10-CM | POA: Diagnosis not present

## 2015-08-07 DIAGNOSIS — J301 Allergic rhinitis due to pollen: Secondary | ICD-10-CM | POA: Diagnosis not present

## 2015-08-21 DIAGNOSIS — J301 Allergic rhinitis due to pollen: Secondary | ICD-10-CM | POA: Diagnosis not present

## 2015-08-21 DIAGNOSIS — J3089 Other allergic rhinitis: Secondary | ICD-10-CM | POA: Diagnosis not present

## 2015-08-22 DIAGNOSIS — Z23 Encounter for immunization: Secondary | ICD-10-CM | POA: Diagnosis not present

## 2015-09-10 DIAGNOSIS — J3089 Other allergic rhinitis: Secondary | ICD-10-CM | POA: Diagnosis not present

## 2015-09-10 DIAGNOSIS — J301 Allergic rhinitis due to pollen: Secondary | ICD-10-CM | POA: Diagnosis not present

## 2015-09-13 DIAGNOSIS — J3089 Other allergic rhinitis: Secondary | ICD-10-CM | POA: Diagnosis not present

## 2015-09-18 DIAGNOSIS — J3089 Other allergic rhinitis: Secondary | ICD-10-CM | POA: Diagnosis not present

## 2015-09-18 DIAGNOSIS — J301 Allergic rhinitis due to pollen: Secondary | ICD-10-CM | POA: Diagnosis not present

## 2015-10-03 DIAGNOSIS — J3089 Other allergic rhinitis: Secondary | ICD-10-CM | POA: Diagnosis not present

## 2015-10-03 DIAGNOSIS — J301 Allergic rhinitis due to pollen: Secondary | ICD-10-CM | POA: Diagnosis not present

## 2015-10-05 DIAGNOSIS — J3089 Other allergic rhinitis: Secondary | ICD-10-CM | POA: Diagnosis not present

## 2015-10-10 DIAGNOSIS — J3089 Other allergic rhinitis: Secondary | ICD-10-CM | POA: Diagnosis not present

## 2015-10-11 ENCOUNTER — Other Ambulatory Visit: Payer: Self-pay | Admitting: Internal Medicine

## 2015-10-16 DIAGNOSIS — J3089 Other allergic rhinitis: Secondary | ICD-10-CM | POA: Diagnosis not present

## 2015-10-16 DIAGNOSIS — J301 Allergic rhinitis due to pollen: Secondary | ICD-10-CM | POA: Diagnosis not present

## 2015-10-24 DIAGNOSIS — J3089 Other allergic rhinitis: Secondary | ICD-10-CM | POA: Diagnosis not present

## 2015-10-24 DIAGNOSIS — J301 Allergic rhinitis due to pollen: Secondary | ICD-10-CM | POA: Diagnosis not present

## 2015-11-07 DIAGNOSIS — J301 Allergic rhinitis due to pollen: Secondary | ICD-10-CM | POA: Diagnosis not present

## 2015-11-07 DIAGNOSIS — J3089 Other allergic rhinitis: Secondary | ICD-10-CM | POA: Diagnosis not present

## 2015-11-12 ENCOUNTER — Other Ambulatory Visit: Payer: Self-pay | Admitting: Internal Medicine

## 2015-11-21 ENCOUNTER — Telehealth: Payer: Self-pay | Admitting: Nurse Practitioner

## 2015-11-21 ENCOUNTER — Ambulatory Visit (INDEPENDENT_AMBULATORY_CARE_PROVIDER_SITE_OTHER): Payer: PPO | Admitting: Nurse Practitioner

## 2015-11-21 ENCOUNTER — Encounter: Payer: Self-pay | Admitting: Nurse Practitioner

## 2015-11-21 VITALS — BP 102/70 | HR 68 | Resp 16 | Wt 143.0 lb

## 2015-11-21 DIAGNOSIS — R300 Dysuria: Secondary | ICD-10-CM | POA: Diagnosis not present

## 2015-11-21 DIAGNOSIS — J3089 Other allergic rhinitis: Secondary | ICD-10-CM | POA: Diagnosis not present

## 2015-11-21 DIAGNOSIS — J301 Allergic rhinitis due to pollen: Secondary | ICD-10-CM | POA: Diagnosis not present

## 2015-11-21 LAB — POCT URINALYSIS DIPSTICK
BILIRUBIN UA: NEGATIVE
Glucose, UA: NORMAL
Nitrite, UA: NEGATIVE
PH UA: 5
PROTEIN UA: 30
Urobilinogen, UA: NEGATIVE

## 2015-11-21 MED ORDER — CIPROFLOXACIN HCL 500 MG PO TABS: 500.0000 mg | ORAL_TABLET | Freq: Two times a day (BID) | ORAL | Status: DC

## 2015-11-21 MED ORDER — ZOLPIDEM TARTRATE 10 MG PO TABS: 10.0000 mg | ORAL_TABLET | Freq: Every evening | ORAL | Status: DC | PRN

## 2015-11-21 NOTE — Patient Instructions (Signed)

## 2015-11-21 NOTE — Telephone Encounter (Signed)
Patient had oral surgery last week and is now having urinary issues. Patient is not sure if she needs an appointment. Patient would like to talk with a nurse.

## 2015-11-21 NOTE — Progress Notes (Signed)
70 y.o.Married caucasian female G2P2002 here with complaint of UTI, with onset  on last Friday 13,2017. Patient complaining of:  dysuria, urinary urgency and cloudy malordorous urine. Patient denies fever, chills, nausea or back pain. No new personal products. Patient feels not to sexual activity. Denies vaginal symptoms.  Menopausal with vaginal dryness that is treated with vaginal estrogen. Patient with adequate water intake.  Recent oral surgery was on 11/14/15 and was on Pen V K.   O: Healthy female WDWN Affect: Normal, orientation x 3 Skin : warm and dry CVAT: no bilateral Abdomen: positive for mild suprapubic tenderness  POCT: UA Positive for leuk's, RBC, , ketones  A:  UTI   P: Reviewed findings of UTI and need for treatment. Rx:  Cipro 500 mg BID # [id number] micro, culture Reviewed warning signs and symptoms of UTI and need to advise if occurring. Encouraged to limit soda, tea, and coffee   RV prn

## 2015-11-21 NOTE — Telephone Encounter (Signed)
Spoke with patient. Patient states that she had oral surgery last week. She was placed on Penicillin for the procedure. Patient woke up today with bladder pressure. She is requesting an appointment to be seen with Kem Boroughs, FNP today for evaluation of possible UTI. Appointment scheduled for today at 3:30 pm with Kem Boroughs, FNP. Patient is agreeable to date and time.  Routing to provider for final review. Patient agreeable to disposition. Will close encounter.

## 2015-11-22 LAB — URINALYSIS, MICROSCOPIC ONLY
CASTS: NONE SEEN [LPF]
Crystals: NONE SEEN [HPF]
WBC, UA: >60 WBC/HPF — AB (ref <=5)
YEAST: NONE SEEN [HPF]

## 2015-11-23 ENCOUNTER — Telehealth: Payer: Self-pay | Admitting: Nurse Practitioner

## 2015-11-23 LAB — URINE CULTURE: Colony Count: >=100000

## 2015-11-23 NOTE — Telephone Encounter (Signed)
Patient was left a message that she was on the correct antibiotic for UTI.

## 2015-11-25 NOTE — Progress Notes (Signed)
Encounter reviewed by Dr. Caydence Enck Amundson C. Silva.  

## 2015-11-27 DIAGNOSIS — D1801 Hemangioma of skin and subcutaneous tissue: Secondary | ICD-10-CM | POA: Diagnosis not present

## 2015-11-27 DIAGNOSIS — L821 Other seborrheic keratosis: Secondary | ICD-10-CM | POA: Diagnosis not present

## 2015-11-29 ENCOUNTER — Telehealth: Payer: Self-pay | Admitting: Nurse Practitioner

## 2015-11-29 NOTE — Telephone Encounter (Signed)
Carly from [facility] calling to start prior auth for patient.  Best # to reach: [phone]  EOC # [id number]

## 2015-11-29 NOTE — Telephone Encounter (Signed)
Spoke with Carly at [facility] regarding PA for patient's Ambien prescription. PA performed over the phone. Carly states [facility] will contact the office directly with PA determination.  Routing to provider for final review. Patient agreeable to disposition. Will close encounter.

## 2015-12-18 DIAGNOSIS — M67432 Ganglion, left wrist: Secondary | ICD-10-CM | POA: Diagnosis not present

## 2015-12-18 DIAGNOSIS — M1812 Unilateral primary osteoarthritis of first carpometacarpal joint, left hand: Secondary | ICD-10-CM | POA: Diagnosis not present

## 2015-12-19 DIAGNOSIS — J3089 Other allergic rhinitis: Secondary | ICD-10-CM | POA: Diagnosis not present

## 2015-12-19 DIAGNOSIS — J301 Allergic rhinitis due to pollen: Secondary | ICD-10-CM | POA: Diagnosis not present

## 2015-12-19 DIAGNOSIS — J209 Acute bronchitis, unspecified: Secondary | ICD-10-CM | POA: Diagnosis not present

## 2015-12-19 DIAGNOSIS — R05 Cough: Secondary | ICD-10-CM | POA: Diagnosis not present

## 2015-12-21 DIAGNOSIS — J3089 Other allergic rhinitis: Secondary | ICD-10-CM | POA: Diagnosis not present

## 2015-12-21 DIAGNOSIS — J301 Allergic rhinitis due to pollen: Secondary | ICD-10-CM | POA: Diagnosis not present

## 2015-12-23 ENCOUNTER — Other Ambulatory Visit: Payer: Self-pay | Admitting: Internal Medicine

## 2016-01-02 DIAGNOSIS — J3089 Other allergic rhinitis: Secondary | ICD-10-CM | POA: Diagnosis not present

## 2016-01-02 DIAGNOSIS — J301 Allergic rhinitis due to pollen: Secondary | ICD-10-CM | POA: Diagnosis not present

## 2016-01-21 DIAGNOSIS — J301 Allergic rhinitis due to pollen: Secondary | ICD-10-CM | POA: Diagnosis not present

## 2016-01-21 DIAGNOSIS — J3089 Other allergic rhinitis: Secondary | ICD-10-CM | POA: Diagnosis not present

## 2016-01-22 DIAGNOSIS — J301 Allergic rhinitis due to pollen: Secondary | ICD-10-CM | POA: Diagnosis not present

## 2016-01-31 ENCOUNTER — Other Ambulatory Visit: Payer: Self-pay | Admitting: Internal Medicine

## 2016-02-05 DIAGNOSIS — J301 Allergic rhinitis due to pollen: Secondary | ICD-10-CM | POA: Diagnosis not present

## 2016-02-05 DIAGNOSIS — J3089 Other allergic rhinitis: Secondary | ICD-10-CM | POA: Diagnosis not present

## 2016-02-07 DIAGNOSIS — Z961 Presence of intraocular lens: Secondary | ICD-10-CM | POA: Diagnosis not present

## 2016-02-07 DIAGNOSIS — H524 Presbyopia: Secondary | ICD-10-CM | POA: Diagnosis not present

## 2016-02-20 DIAGNOSIS — J301 Allergic rhinitis due to pollen: Secondary | ICD-10-CM | POA: Diagnosis not present

## 2016-02-20 DIAGNOSIS — J3089 Other allergic rhinitis: Secondary | ICD-10-CM | POA: Diagnosis not present

## 2016-03-03 DIAGNOSIS — S1096XA Insect bite of unspecified part of neck, initial encounter: Secondary | ICD-10-CM | POA: Diagnosis not present

## 2016-03-03 DIAGNOSIS — L821 Other seborrheic keratosis: Secondary | ICD-10-CM | POA: Diagnosis not present

## 2016-03-06 DIAGNOSIS — J3089 Other allergic rhinitis: Secondary | ICD-10-CM | POA: Diagnosis not present

## 2016-03-06 DIAGNOSIS — J301 Allergic rhinitis due to pollen: Secondary | ICD-10-CM | POA: Diagnosis not present

## 2016-03-18 DIAGNOSIS — J3089 Other allergic rhinitis: Secondary | ICD-10-CM | POA: Diagnosis not present

## 2016-03-18 DIAGNOSIS — J3081 Allergic rhinitis due to animal (cat) (dog) hair and dander: Secondary | ICD-10-CM | POA: Diagnosis not present

## 2016-03-18 DIAGNOSIS — J301 Allergic rhinitis due to pollen: Secondary | ICD-10-CM | POA: Diagnosis not present

## 2016-03-25 ENCOUNTER — Other Ambulatory Visit: Payer: Self-pay | Admitting: Internal Medicine

## 2016-03-25 DIAGNOSIS — J3089 Other allergic rhinitis: Secondary | ICD-10-CM | POA: Diagnosis not present

## 2016-03-25 DIAGNOSIS — J301 Allergic rhinitis due to pollen: Secondary | ICD-10-CM | POA: Diagnosis not present

## 2016-04-01 DIAGNOSIS — J301 Allergic rhinitis due to pollen: Secondary | ICD-10-CM | POA: Diagnosis not present

## 2016-04-01 DIAGNOSIS — J3089 Other allergic rhinitis: Secondary | ICD-10-CM | POA: Diagnosis not present

## 2016-04-08 DIAGNOSIS — J3089 Other allergic rhinitis: Secondary | ICD-10-CM | POA: Diagnosis not present

## 2016-04-08 DIAGNOSIS — J301 Allergic rhinitis due to pollen: Secondary | ICD-10-CM | POA: Diagnosis not present

## 2016-04-14 DIAGNOSIS — J301 Allergic rhinitis due to pollen: Secondary | ICD-10-CM | POA: Diagnosis not present

## 2016-04-14 DIAGNOSIS — Z803 Family history of malignant neoplasm of breast: Secondary | ICD-10-CM | POA: Diagnosis not present

## 2016-04-14 DIAGNOSIS — Z1231 Encounter for screening mammogram for malignant neoplasm of breast: Secondary | ICD-10-CM | POA: Diagnosis not present

## 2016-04-14 DIAGNOSIS — J3089 Other allergic rhinitis: Secondary | ICD-10-CM | POA: Diagnosis not present

## 2016-04-14 LAB — HM MAMMOGRAPHY

## 2016-04-16 ENCOUNTER — Ambulatory Visit (INDEPENDENT_AMBULATORY_CARE_PROVIDER_SITE_OTHER): Payer: PPO | Admitting: Nurse Practitioner

## 2016-04-16 ENCOUNTER — Encounter: Payer: Self-pay | Admitting: Internal Medicine

## 2016-04-16 ENCOUNTER — Encounter: Payer: Self-pay | Admitting: Nurse Practitioner

## 2016-04-16 ENCOUNTER — Telehealth: Payer: Self-pay | Admitting: Nurse Practitioner

## 2016-04-16 VITALS — BP 100/70 | HR 80 | Resp 16 | Ht 59.5 in | Wt 144.0 lb

## 2016-04-16 DIAGNOSIS — Z01419 Encounter for gynecological examination (general) (routine) without abnormal findings: Secondary | ICD-10-CM | POA: Diagnosis not present

## 2016-04-16 DIAGNOSIS — Z0000 Encounter for general adult medical examination without abnormal findings: Secondary | ICD-10-CM | POA: Diagnosis not present

## 2016-04-16 LAB — POCT URINALYSIS DIPSTICK
BILIRUBIN UA: NEGATIVE
Glucose, UA: NEGATIVE
KETONES UA: NEGATIVE
LEUKOCYTES UA: NEGATIVE
Nitrite, UA: NEGATIVE
PH UA: 5
PROTEIN UA: NEGATIVE
RBC UA: NEGATIVE
Urobilinogen, UA: NEGATIVE

## 2016-04-16 MED ORDER — ZOLPIDEM TARTRATE 10 MG PO TABS: 10.0000 mg | ORAL_TABLET | Freq: Every evening | ORAL | Status: DC | PRN

## 2016-04-16 MED ORDER — ESTRADIOL 0.1 MG/GM VA CREA: TOPICAL_CREAM | VAGINAL | Status: DC

## 2016-04-16 NOTE — Telephone Encounter (Signed)
Patient was last seen for aex on 04/16/2016. Last mammogram was performed on 04/14/2016 and was normal per aex note. Patient is requesting refills on Estrace cream.  Kem Boroughs, FNP okay to refill Estrace until the patient's next aex?

## 2016-04-16 NOTE — Telephone Encounter (Signed)
Patient needing a refill on her estrace cream and forgot to mention at visit with Ms. Patty earlier today. [facility] on [address] [phone].

## 2016-04-16 NOTE — Patient Instructions (Addendum)

## 2016-04-16 NOTE — Progress Notes (Signed)
70 y.o. G33P2002 Married  Caucasian Fe here for annual exam.  No new health problems.  Going to the beach a lot with family.  Going to [place] in August.  She and husband remain very active. No major problems since coming off estrogen in fall 2015.  Patient's last menstrual period was 07/28/1987.          Sexually active: Yes.    The current method of family planning is post menopausal status.    Exercising: Yes.    Aerobics, walking, weights Smoker:  no  Health Maintenance: Pap:  04/10/14 Neg MMG:  04/14/16 Normal  Colonoscopy:  06/16/12 Mild diverticulosis - f/u 10 years  BMD:   04/23/15 T Score: spine -0.8; right hip -0.9; left hip -0.6 (f/u 5 years ) TDaP:  02/2010 Shingles: 12/2006 Pneumonia: 04/12/2014 Hep C will get at PCP - going there tomorrow Labs: PCP   reports that she has never smoked. She has never used smokeless tobacco. She reports that she drinks about 4.2 oz of alcohol per week. She reports that she does not use illicit drugs.  Past Medical History  Diagnosis Date  . Allergic rhinitis   . Vitamin D deficiency   . Blood transfusion     after tonsillectomy as child; and after hysterectomy  . Diverticulosis 2003  . Hypothyroidism 12/2006    Past Surgical History  Procedure Laterality Date  . Tonsillectomy    . Bunionectomy      hammertoe right '01, hammertoe left '06  . Blepharoplasty    . Tubal ligation    . Colonoscopy  06/15/2012  . Vaginal hysterectomy  1988    had prolapse and blood transfusion  . Abdominal hysterectomy      Current Outpatient Prescriptions  Medication Sig Dispense Refill  . Azelastine HCl (ASTEPRO) 0.15 % SOLN by Nasal route as needed.      Marland Kitchen BEPREVE 1.5 % SOLN Place 1.5 drops into both eyes as needed.    . Biotin 5000 MCG CAPS Take 20,000 mcg by mouth daily.     . Cetirizine HCl (ZYRTEC ALLERGY) 10 MG CAPS Take by mouth as needed. Reported on 11/21/2015    . cholecalciferol (VITAMIN D) 1000 UNITS tablet Take 5,000 Units by mouth daily.      Marland Kitchen levothyroxine (SYNTHROID, LEVOTHROID) 50 MCG tablet TAKE 1 TABLET BY MOUTH EVERY DAY BEFORE BREAKFAST 30 tablet 0  . Multiple Vitamins-Minerals (CENTRUM SILVER PO) Take by mouth daily.      . ranitidine (ZANTAC) 150 MG tablet Take 150 mg by mouth as needed.      . zolpidem (AMBIEN) 10 MG tablet Take 1 tablet (10 mg total) by mouth at bedtime as needed. for sleep 30 tablet 5   No current facility-administered medications for this visit.    Family History  Problem Relation Age of Onset  . Coronary artery disease Mother   . Breast cancer Mother   . Thyroid disease Mother   . Hypertension Mother   . Osteoporosis Mother   . Lung cancer Father     age 58  . Diabetes Paternal Uncle   . Colon cancer Neg Hx   . Stomach cancer Neg Hx   . Asthma Brother   . Epilepsy Brother   . Diabetes Maternal Grandmother   . Multiple births Paternal Grandmother     ROS:  Pertinent items are noted in HPI.  Otherwise, a comprehensive ROS was negative.  Exam:   BP 100/70 mmHg  Pulse 80  Resp  16  Ht 4' 11.5" (1.511 m)  Wt 144 lb (65.318 kg)  BMI 28.61 kg/m2  LMP 07/28/1987 Height: 4' 11.5" (151.1 cm) Ht Readings from Last 3 Encounters:  04/16/16 4' 11.5" (1.511 m)  05/14/15 4' 11.5" (1.511 m)  04/17/15 4' 11.5" (1.511 m)    General appearance: alert, cooperative and appears stated age Head: Normocephalic, without obvious abnormality, atraumatic Neck: no adenopathy, supple, symmetrical, trachea midline and thyroid normal to inspection and palpation Lungs: clear to auscultation bilaterally Breasts: normal appearance, no masses or tenderness Heart: regular rate and rhythm Abdomen: soft, non-tender; no masses,  no organomegaly Extremities: extremities normal, atraumatic, no cyanosis or edema Skin: Skin color, texture, turgor normal. No rashes or lesions Lymph nodes: Cervical, supraclavicular, and axillary nodes normal. No abnormal inguinal nodes palpated Neurologic: Grossly  normal   Pelvic: External genitalia:  no lesions              Urethra:  normal appearing urethra with no masses, tenderness or lesions              Bartholin's and Skene's: normal                 Vagina: atrophic appearing vagina with normal color and discharge, no lesions              Cervix: absent              Pap taken: No. Bimanual Exam:  Uterus:  uterus absent              Adnexa: no mass, fullness, tenderness               Rectovaginal: Confirms               Anus:  normal sphincter tone, no lesions  Chaperone present: no  A:  Well Woman with normal exam  S/P TVH secondary to prolapse 1988 Postmenopausal on ERT since 1993 till fall 2015 History of insomnia  Atrophic vaginitis   P:   Reviewed health and wellness pertinent to exam  Pap smear as above  Mammogram is due 04/2017 if this one is normal  Did refill Estrace vaginal cream after she left as this was not listed - did not initially refill  She is aware of potential risk of DVT, CVA, and cancer  Will get a copy of Mammo  Refill on Ambien to use prn ( normally takes a small 'pinch' of a table)  Counseled on breast self exam, mammography screening, use and side effects of ERT, adequate intake of calcium and vitamin D, diet and exercise return annually or prn  An After Visit Summary was printed and given to the patient.

## 2016-04-16 NOTE — Telephone Encounter (Signed)
On her AEX med list Estrace was not on there so it did not alert me to a refill.  But I will refill with completion of her note today.

## 2016-04-17 ENCOUNTER — Ambulatory Visit (INDEPENDENT_AMBULATORY_CARE_PROVIDER_SITE_OTHER): Payer: PPO | Admitting: Internal Medicine

## 2016-04-17 ENCOUNTER — Encounter: Payer: Self-pay | Admitting: Internal Medicine

## 2016-04-17 ENCOUNTER — Other Ambulatory Visit (INDEPENDENT_AMBULATORY_CARE_PROVIDER_SITE_OTHER): Payer: PPO

## 2016-04-17 VITALS — BP 120/80 | HR 62 | Temp 97.9°F | Resp 16 | Ht 59.5 in | Wt 141.0 lb

## 2016-04-17 DIAGNOSIS — Z1159 Encounter for screening for other viral diseases: Secondary | ICD-10-CM | POA: Diagnosis not present

## 2016-04-17 DIAGNOSIS — E890 Postprocedural hypothyroidism: Secondary | ICD-10-CM

## 2016-04-17 DIAGNOSIS — Z23 Encounter for immunization: Secondary | ICD-10-CM

## 2016-04-17 DIAGNOSIS — Z0000 Encounter for general adult medical examination without abnormal findings: Secondary | ICD-10-CM

## 2016-04-17 LAB — COMPREHENSIVE METABOLIC PANEL
ALBUMIN: 4.6 g/dL (ref 3.5–5.2)
ALT: 19 U/L (ref 0–35)
AST: 18 U/L (ref 0–37)
Alkaline Phosphatase: 61 U/L (ref 39–117)
BUN: 17 mg/dL (ref 6–23)
CHLORIDE: 105 meq/L (ref 96–112)
CO2: 28 mEq/L (ref 19–32)
Calcium: 9.5 mg/dL (ref 8.4–10.5)
Creatinine, Ser: 0.9 mg/dL (ref 0.40–1.20)
GFR: 65.37 mL/min (ref >60.00)
GLUCOSE: 96 mg/dL (ref 70–99)
POTASSIUM: 4 meq/L (ref 3.5–5.1)
SODIUM: 140 meq/L (ref 135–145)
Total Bilirubin: 0.5 mg/dL (ref 0.2–1.2)
Total Protein: 7.4 g/dL (ref 6.0–8.3)

## 2016-04-17 LAB — LIPID PANEL
Cholesterol: 222 mg/dL — ABNORMAL HIGH (ref 0–200)
HDL: 80.7 mg/dL (ref >39.00)
LDL CALC: 121 mg/dL — AB (ref 0–99)
NONHDL: 141.12
Total CHOL/HDL Ratio: 3
Triglycerides: 102 mg/dL (ref 0.0–149.0)
VLDL: 20.4 mg/dL (ref 0.0–40.0)

## 2016-04-17 LAB — CBC WITH DIFFERENTIAL/PLATELET
BASOS PCT: 0.6 % (ref 0.0–3.0)
Basophils Absolute: 0 10*3/uL (ref 0.0–0.1)
EOS PCT: 1.6 % (ref 0.0–5.0)
Eosinophils Absolute: 0.1 10*3/uL (ref 0.0–0.7)
HCT: 43.2 % (ref 36.0–46.0)
HEMOGLOBIN: 14.5 g/dL (ref 12.0–15.0)
Lymphocytes Relative: 28.2 % (ref 12.0–46.0)
Lymphs Abs: 2 10*3/uL (ref 0.7–4.0)
MCHC: 33.5 g/dL (ref 30.0–36.0)
MCV: 91.9 fl (ref 78.0–100.0)
MONO ABS: 0.9 10*3/uL (ref 0.1–1.0)
MONOS PCT: 12.3 % — AB (ref 3.0–12.0)
NEUTROS PCT: 57.3 % (ref 43.0–77.0)
Neutro Abs: 4.1 10*3/uL (ref 1.4–7.7)
Platelets: 270 10*3/uL (ref 150.0–400.0)
RBC: 4.7 Mil/uL (ref 3.87–5.11)
RDW: 13.1 % (ref 11.5–15.5)
WBC: 7.2 10*3/uL (ref 4.0–10.5)

## 2016-04-17 LAB — TSH: TSH: 2.22 u[IU]/mL (ref 0.35–4.50)

## 2016-04-17 LAB — HEPATITIS C ANTIBODY: HCV Ab: NEGATIVE

## 2016-04-17 MED ORDER — LEVOTHYROXINE SODIUM 50 MCG PO TABS: ORAL_TABLET | ORAL | Status: DC

## 2016-04-17 NOTE — Progress Notes (Signed)
Subjective:  Patient ID: Maria Montgomery, female    DOB: Sep 08, 1946  Age: 70 y.o. MRN: [id number]  CC: Annual Exam and Hypothyroidism   HPI JASANI AKRIDGE presents for CPX.  She is due for a follow-up on hypothyroidism. On her current dose of levothyroxine she feels well with no episodes of fatigue, constipation, weight changes, or edema. She does aerobics 3 times a week and plays golf and denies any episodes of dyspnea on exertion, chest pain, shortness of breath, or palpitations.  Past Medical History  Diagnosis Date  . Allergic rhinitis   . Vitamin D deficiency   . Blood transfusion     after tonsillectomy as child; and after hysterectomy  . Diverticulosis 2003  . Hypothyroidism 12/2006   Past Surgical History  Procedure Laterality Date  . Tonsillectomy    . Bunionectomy      hammertoe right '01, hammertoe left '06  . Blepharoplasty    . Tubal ligation    . Colonoscopy  06/15/2012  . Vaginal hysterectomy  1988    had prolapse and blood transfusion  . Abdominal hysterectomy      reports that she has never smoked. She has never used smokeless tobacco. She reports that she drinks about 4.2 oz of alcohol per week. She reports that she does not use illicit drugs. family history includes Asthma in her brother; Breast cancer in her mother; Coronary artery disease in her mother; Diabetes in her maternal grandmother and paternal uncle; Epilepsy in her brother; Hypertension in her mother; Lung cancer in her father; Multiple births in her paternal grandmother; Osteoporosis in her mother; Thyroid disease in her mother. There is no history of Colon cancer or Stomach cancer. Allergies  Allergen Reactions  . Morphine And Related Swelling    Outpatient Prescriptions Prior to Visit  Medication Sig Dispense Refill  . Azelastine HCl (ASTEPRO) 0.15 % SOLN by Nasal route as needed.      Marland Kitchen BEPREVE 1.5 % SOLN Place 1.5 drops into both eyes as needed.    . Biotin 5000 MCG CAPS Take 20,000 mcg by  mouth daily.     . Cetirizine HCl (ZYRTEC ALLERGY) 10 MG CAPS Take by mouth as needed. Reported on 11/21/2015    . cholecalciferol (VITAMIN D) 1000 UNITS tablet Take 5,000 Units by mouth daily.     Marland Kitchen estradiol (ESTRACE) 0.1 MG/GM vaginal cream Use 1/2 g vaginally twice weekly 42.5 g 3  . Multiple Vitamins-Minerals (CENTRUM SILVER PO) Take by mouth daily.      . ranitidine (ZANTAC) 150 MG tablet Take 150 mg by mouth as needed.      . zolpidem (AMBIEN) 10 MG tablet Take 1 tablet (10 mg total) by mouth at bedtime as needed. for sleep 30 tablet 5  . levothyroxine (SYNTHROID, LEVOTHROID) 50 MCG tablet TAKE 1 TABLET BY MOUTH EVERY DAY BEFORE BREAKFAST 30 tablet 0   No facility-administered medications prior to visit.    ROS Review of Systems  Constitutional: Negative.  Negative for fever, chills, diaphoresis, appetite change and fatigue.  HENT: Negative.  Negative for facial swelling, sinus pressure and trouble swallowing.   Eyes: Negative.  Negative for visual disturbance.  Respiratory: Negative.  Negative for apnea, cough, choking, chest tightness, shortness of breath and stridor.   Cardiovascular: Negative.  Negative for chest pain, palpitations and leg swelling.  Gastrointestinal: Negative.  Negative for nausea, vomiting, abdominal pain, diarrhea, constipation and blood in stool.  Endocrine: Negative.   Genitourinary: Negative.  Negative for  difficulty urinating.  Musculoskeletal: Negative.  Negative for back pain and neck pain.  Skin: Negative.  Negative for color change and rash.  Allergic/Immunologic: Negative.   Neurological: Negative.  Negative for dizziness, tremors, weakness, light-headedness and numbness.  Hematological: Negative.  Negative for adenopathy. Does not bruise/bleed easily.  Psychiatric/Behavioral: Negative.     Objective:  BP 120/80 mmHg  Pulse 62  Temp(Src) 97.9 F (36.6 C) (Oral)  Resp 16  Ht 4' 11.5" (1.511 m)  Wt 141 lb (63.957 kg)  BMI 28.01 kg/m2  SpO2  98%  LMP 07/28/1987  BP Readings from Last 3 Encounters:  04/17/16 120/80  04/16/16 100/70  11/21/15 102/70    Wt Readings from Last 3 Encounters:  04/17/16 141 lb (63.957 kg)  04/16/16 144 lb (65.318 kg)  11/21/15 143 lb (64.864 kg)    Physical Exam  Constitutional: She is oriented to person, place, and time. No distress.  HENT:  Mouth/Throat: Oropharynx is clear and moist. No oropharyngeal exudate.  Eyes: Conjunctivae are normal. Right eye exhibits no discharge. Left eye exhibits no discharge. No scleral icterus.  Neck: Normal range of motion. Neck supple. No JVD present. No tracheal deviation present. No thyromegaly present.  Cardiovascular: Normal rate, regular rhythm, normal heart sounds and intact distal pulses.  Exam reveals no gallop and no friction rub.   No murmur heard. Pulmonary/Chest: Effort normal and breath sounds normal. No stridor. No respiratory distress. She has no wheezes. She has no rales. She exhibits no tenderness.  Abdominal: Soft. Bowel sounds are normal. She exhibits no distension and no mass. There is no tenderness. There is no rebound and no guarding.  Musculoskeletal: Normal range of motion. She exhibits no edema or tenderness.  Lymphadenopathy:    She has no cervical adenopathy.  Neurological: She is oriented to person, place, and time.  Skin: Skin is warm and dry. No rash noted. She is not diaphoretic. No erythema. No pallor.  Vitals reviewed.   Lab Results  Component Value Date   WBC 7.2 04/17/2016   HGB 14.5 04/17/2016   HCT 43.2 04/17/2016   PLT 270.0 04/17/2016   GLUCOSE 96 04/17/2016   CHOL 222* 04/17/2016   TRIG 102.0 04/17/2016   HDL 80.70 04/17/2016   LDLDIRECT 97.6 02/05/2009   LDLCALC 121* 04/17/2016   ALT 19 04/17/2016   AST 18 04/17/2016   NA 140 04/17/2016   K 4.0 04/17/2016   CL 105 04/17/2016   CREATININE 0.90 04/17/2016   BUN 17 04/17/2016   CO2 28 04/17/2016   TSH 2.22 04/17/2016    Dg Hand Complete  Left  05/15/2015  CLINICAL DATA:  Carpal/metacarpal joint pain, redness, and swelling for 5 days. EXAM: LEFT HAND - COMPLETE 3+ VIEW COMPARISON:  None. FINDINGS: There is no evidence of fracture or dislocation. There is severe degenerative joint changes of the first carpal/metacarpal joint with narrowed joint space and osteophyte formation. There are osteophyte formation with narrowed joint space of the second and third distal interphalangeal joint consistent with osteoarthritis. Soft tissues are unremarkable. IMPRESSION: No acute fracture or dislocation.  Osteoarthritic changes. Electronically Signed   By: Abelardo Diesel M.D.   On: 05/15/2015 08:17   Dg Hand Complete Right  05/15/2015  CLINICAL DATA:  Right hand pain. EXAM: RIGHT HAND - COMPLETE 3+ VIEW COMPARISON:  None. FINDINGS: Moderate DIP and PIP joint osteoarthritis with joint space narrowing and early spurring. There is also mild joint space narrowing at the metacarpal phalangeal joints but no obvious erosive findings.  Advanced degenerative changes at the carpometacarpal joint of thumb. The intercarpal joint spaces are fairly well maintained in the radiocarpal joint is normal. IMPRESSION: Osteoarthritic type degenerative changes involving the DIP and PIP joints of the fingers and the carpometacarpal joint of the thumb. No definite findings for an erosive arthropathy. Electronically Signed   By: Marijo Sanes M.D.   On: 05/15/2015 08:20    Assessment & Plan:   Rasheta was seen today for annual exam and hypothyroidism.  Diagnoses and all orders for this visit:  Postoperative hypothyroidism- her TSH is in the normal range, she will remain on the current dose of levothyroxine. -     Lipid panel; Future -     Comprehensive metabolic panel; Future -     CBC with Differential/Platelet; Future -     TSH; Future -     levothyroxine (SYNTHROID, LEVOTHROID) 50 MCG tablet; TAKE 1 TABLET BY MOUTH EVERY DAY BEFORE BREAKFAST  Need for hepatitis C screening  test -     Hepatitis C antibody; Future  Routine health maintenance  Need for 23-polyvalent pneumococcal polysaccharide vaccine -     Pneumococcal polysaccharide vaccine 23-valent greater than or equal to 2yo subcutaneous/IM  Other orders -     Cancel: levothyroxine (SYNTHROID, LEVOTHROID) 50 MCG tablet; TAKE 1 TABLET BY MOUTH EVERY DAY BEFORE BREAKFAST   I am having Ms. Delafuente maintain her Azelastine HCl, Biotin, Multiple Vitamins-Minerals (CENTRUM SILVER PO), ranitidine, Cetirizine HCl, BEPREVE, cholecalciferol, zolpidem, estradiol, and levothyroxine.  Meds ordered this encounter  Medications  . levothyroxine (SYNTHROID, LEVOTHROID) 50 MCG tablet    Sig: TAKE 1 TABLET BY MOUTH EVERY DAY BEFORE BREAKFAST    Dispense:  90 tablet    Refill:  1   See AVS for instructions about healthy living and anticipatory guidance.  Follow-up: Return in about 6 months (around 10/17/2016).  Scarlette Calico, MD

## 2016-04-17 NOTE — Patient Instructions (Signed)
Preventive Care for Adults, Female A healthy lifestyle and preventive care can promote health and wellness. Preventive health guidelines for women include the following key practices.  A routine yearly physical is a good way to check with your health care provider about your health and preventive screening. It is a chance to share any concerns and updates on your health and to receive a thorough exam.  Visit your dentist for a routine exam and preventive care every 6 months. Brush your teeth twice a day and floss once a day. Good oral hygiene prevents tooth decay and gum disease.  The frequency of eye exams is based on your age, health, family medical history, use of contact lenses, and other factors. Follow your health care provider's recommendations for frequency of eye exams.  Eat a healthy diet. Foods like vegetables, fruits, whole grains, low-fat dairy products, and lean protein foods contain the nutrients you need without too many calories. Decrease your intake of foods high in solid fats, added sugars, and salt. Eat the right amount of calories for you.Get information about a proper diet from your health care provider, if necessary.  Regular physical exercise is one of the most important things you can do for your health. Most adults should get at least 150 minutes of moderate-intensity exercise (any activity that increases your heart rate and causes you to sweat) each week. In addition, most adults need muscle-strengthening exercises on 2 or more days a week.  Maintain a healthy weight. The body mass index (BMI) is a screening tool to identify possible weight problems. It provides an estimate of body fat based on height and weight. Your health care provider can find your BMI and can help you achieve or maintain a healthy weight.For adults 20 years and older:  A BMI below 18.5 is considered underweight.  A BMI of 18.5 to 24.9 is normal.  A BMI of 25 to 29.9 is considered overweight.  A  BMI of 30 and above is considered obese.  Maintain normal blood lipids and cholesterol levels by exercising and minimizing your intake of saturated fat. Eat a balanced diet with plenty of fruit and vegetables. Blood tests for lipids and cholesterol should begin at age 45 and be repeated every 5 years. If your lipid or cholesterol levels are high, you are over 50, or you are at high risk for heart disease, you may need your cholesterol levels checked more frequently.Ongoing high lipid and cholesterol levels should be treated with medicines if diet and exercise are not working.  If you smoke, find out from your health care provider how to quit. If you do not use tobacco, do not start.  Lung cancer screening is recommended for adults aged 45-80 years who are at high risk for developing lung cancer because of a history of smoking. A yearly low-dose CT scan of the lungs is recommended for people who have at least a 30-pack-year history of smoking and are a current smoker or have quit within the past 15 years. A pack year of smoking is smoking an average of 1 pack of cigarettes a day for 1 year (for example: 1 pack a day for 30 years or 2 packs a day for 15 years). Yearly screening should continue until the smoker has stopped smoking for at least 15 years. Yearly screening should be stopped for people who develop a health problem that would prevent them from having lung cancer treatment.  If you are pregnant, do not drink alcohol. If you are  breastfeeding, be very cautious about drinking alcohol. If you are not pregnant and choose to drink alcohol, do not have more than 1 drink per day. One drink is considered to be 12 ounces (355 mL) of beer, 5 ounces (148 mL) of wine, or 1.5 ounces (44 mL) of liquor.  Avoid use of street drugs. Do not share needles with anyone. Ask for help if you need support or instructions about stopping the use of drugs.  High blood pressure causes heart disease and increases the risk  of stroke. Your blood pressure should be checked at least every 1 to 2 years. Ongoing high blood pressure should be treated with medicines if weight loss and exercise do not work.  If you are 55-79 years old, ask your health care provider if you should take aspirin to prevent strokes.  Diabetes screening is done by taking a blood sample to check your blood glucose level after you have not eaten for a certain period of time (fasting). If you are not overweight and you do not have risk factors for diabetes, you should be screened once every 3 years starting at age 45. If you are overweight or obese and you are 40-70 years of age, you should be screened for diabetes every year as part of your cardiovascular risk assessment.  Breast cancer screening is essential preventive care for women. You should practice "breast self-awareness." This means understanding the normal appearance and feel of your breasts and may include breast self-examination. Any changes detected, no matter how small, should be reported to a health care provider. Women in their 20s and 30s should have a clinical breast exam (CBE) by a health care provider as part of a regular health exam every 1 to 3 years. After age 40, women should have a CBE every year. Starting at age 40, women should consider having a mammogram (breast X-ray test) every year. Women who have a family history of breast cancer should talk to their health care provider about genetic screening. Women at a high risk of breast cancer should talk to their health care providers about having an MRI and a mammogram every year.  Breast cancer gene (BRCA)-related cancer risk assessment is recommended for women who have family members with BRCA-related cancers. BRCA-related cancers include breast, ovarian, tubal, and peritoneal cancers. Having family members with these cancers may be associated with an increased risk for harmful changes (mutations) in the breast cancer genes BRCA1 and  BRCA2. Results of the assessment will determine the need for genetic counseling and BRCA1 and BRCA2 testing.  Your health care provider may recommend that you be screened regularly for cancer of the pelvic organs (ovaries, uterus, and vagina). This screening involves a pelvic examination, including checking for microscopic changes to the surface of your cervix (Pap test). You may be encouraged to have this screening done every 3 years, beginning at age 21.  For women ages 30-65, health care providers may recommend pelvic exams and Pap testing every 3 years, or they may recommend the Pap and pelvic exam, combined with testing for human papilloma virus (HPV), every 5 years. Some types of HPV increase your risk of cervical cancer. Testing for HPV may also be done on women of any age with unclear Pap test results.  Other health care providers may not recommend any screening for nonpregnant women who are considered low risk for pelvic cancer and who do not have symptoms. Ask your health care provider if a screening pelvic exam is right for   you.  If you have had past treatment for cervical cancer or a condition that could lead to cancer, you need Pap tests and screening for cancer for at least 20 years after your treatment. If Pap tests have been discontinued, your risk factors (such as having a new sexual partner) need to be reassessed to determine if screening should resume. Some women have medical problems that increase the chance of getting cervical cancer. In these cases, your health care provider may recommend more frequent screening and Pap tests.  Colorectal cancer can be detected and often prevented. Most routine colorectal cancer screening begins at the age of 50 years and continues through age 75 years. However, your health care provider may recommend screening at an earlier age if you have risk factors for colon cancer. On a yearly basis, your health care provider may provide home test kits to check  for hidden blood in the stool. Use of a small camera at the end of a tube, to directly examine the colon (sigmoidoscopy or colonoscopy), can detect the earliest forms of colorectal cancer. Talk to your health care provider about this at age 50, when routine screening begins. Direct exam of the colon should be repeated every 5-10 years through age 75 years, unless early forms of precancerous polyps or small growths are found.  People who are at an increased risk for hepatitis B should be screened for this virus. You are considered at high risk for hepatitis B if:  You were born in a country where hepatitis B occurs often. Talk with your health care provider about which countries are considered high risk.  Your parents were born in a high-risk country and you have not received a shot to protect against hepatitis B (hepatitis B vaccine).  You have HIV or AIDS.  You use needles to inject street drugs.  You live with, or have sex with, someone who has hepatitis B.  You get hemodialysis treatment.  You take certain medicines for conditions like cancer, organ transplantation, and autoimmune conditions.  Hepatitis C blood testing is recommended for all people born from 1945 through 1965 and any individual with known risks for hepatitis C.  Practice safe sex. Use condoms and avoid high-risk sexual practices to reduce the spread of sexually transmitted infections (STIs). STIs include gonorrhea, chlamydia, syphilis, trichomonas, herpes, HPV, and human immunodeficiency virus (HIV). Herpes, HIV, and HPV are viral illnesses that have no cure. They can result in disability, cancer, and death.  You should be screened for sexually transmitted illnesses (STIs) including gonorrhea and chlamydia if:  You are sexually active and are younger than 24 years.  You are older than 24 years and your health care provider tells you that you are at risk for this type of infection.  Your sexual activity has changed  since you were last screened and you are at an increased risk for chlamydia or gonorrhea. Ask your health care provider if you are at risk.  If you are at risk of being infected with HIV, it is recommended that you take a prescription medicine daily to prevent HIV infection. This is called preexposure prophylaxis (PrEP). You are considered at risk if:  You are sexually active and do not regularly use condoms or know the HIV status of your partner(s).  You take drugs by injection.  You are sexually active with a partner who has HIV.  Talk with your health care provider about whether you are at high risk of being infected with HIV. If   you choose to begin PrEP, you should first be tested for HIV. You should then be tested every 3 months for as long as you are taking PrEP.  Osteoporosis is a disease in which the bones lose minerals and strength with aging. This can result in serious bone fractures or breaks. The risk of osteoporosis can be identified using a bone density scan. Women ages 67 years and over and women at risk for fractures or osteoporosis should discuss screening with their health care providers. Ask your health care provider whether you should take a calcium supplement or vitamin D to reduce the rate of osteoporosis.  Menopause can be associated with physical symptoms and risks. Hormone replacement therapy is available to decrease symptoms and risks. You should talk to your health care provider about whether hormone replacement therapy is right for you.  Use sunscreen. Apply sunscreen liberally and repeatedly throughout the day. You should seek shade when your shadow is shorter than you. Protect yourself by wearing long sleeves, pants, a wide-brimmed hat, and sunglasses year round, whenever you are outdoors.  Once a month, do a whole body skin exam, using a mirror to look at the skin on your back. Tell your health care provider of new moles, moles that have irregular borders, moles that  are larger than a pencil eraser, or moles that have changed in shape or color.  Stay current with required vaccines (immunizations).  Influenza vaccine. All adults should be immunized every year.  Tetanus, diphtheria, and acellular pertussis (Td, Tdap) vaccine. Pregnant women should receive 1 dose of Tdap vaccine during each pregnancy. The dose should be obtained regardless of the length of time since the last dose. Immunization is preferred during the 27th-36th week of gestation. An adult who has not previously received Tdap or who does not know her vaccine status should receive 1 dose of Tdap. This initial dose should be followed by tetanus and diphtheria toxoids (Td) booster doses every 10 years. Adults with an unknown or incomplete history of completing a 3-dose immunization series with Td-containing vaccines should begin or complete a primary immunization series including a Tdap dose. Adults should receive a Td booster every 10 years.  Varicella vaccine. An adult without evidence of immunity to varicella should receive 2 doses or a second dose if she has previously received 1 dose. Pregnant females who do not have evidence of immunity should receive the first dose after pregnancy. This first dose should be obtained before leaving the health care facility. The second dose should be obtained 4-8 weeks after the first dose.  Human papillomavirus (HPV) vaccine. Females aged 13-26 years who have not received the vaccine previously should obtain the 3-dose series. The vaccine is not recommended for use in pregnant females. However, pregnancy testing is not needed before receiving a dose. If a female is found to be pregnant after receiving a dose, no treatment is needed. In that case, the remaining doses should be delayed until after the pregnancy. Immunization is recommended for any person with an immunocompromised condition through the age of 61 years if she did not get any or all doses earlier. During the  3-dose series, the second dose should be obtained 4-8 weeks after the first dose. The third dose should be obtained 24 weeks after the first dose and 16 weeks after the second dose.  Zoster vaccine. One dose is recommended for adults aged 30 years or older unless certain conditions are present.  Measles, mumps, and rubella (MMR) vaccine. Adults born  before 1957 generally are considered immune to measles and mumps. Adults born in 1957 or later should have 1 or more doses of MMR vaccine unless there is a contraindication to the vaccine or there is laboratory evidence of immunity to each of the three diseases. A routine second dose of MMR vaccine should be obtained at least 28 days after the first dose for students attending postsecondary schools, health care workers, or international travelers. People who received inactivated measles vaccine or an unknown type of measles vaccine during 1963-1967 should receive 2 doses of MMR vaccine. People who received inactivated mumps vaccine or an unknown type of mumps vaccine before 1979 and are at high risk for mumps infection should consider immunization with 2 doses of MMR vaccine. For females of childbearing age, rubella immunity should be determined. If there is no evidence of immunity, females who are not pregnant should be vaccinated. If there is no evidence of immunity, females who are pregnant should delay immunization until after pregnancy. Unvaccinated health care workers born before 1957 who lack laboratory evidence of measles, mumps, or rubella immunity or laboratory confirmation of disease should consider measles and mumps immunization with 2 doses of MMR vaccine or rubella immunization with 1 dose of MMR vaccine.  Pneumococcal 13-valent conjugate (PCV13) vaccine. When indicated, a person who is uncertain of his immunization history and has no record of immunization should receive the PCV13 vaccine. All adults 65 years of age and older should receive this  vaccine. An adult aged 19 years or older who has certain medical conditions and has not been previously immunized should receive 1 dose of PCV13 vaccine. This PCV13 should be followed with a dose of pneumococcal polysaccharide (PPSV23) vaccine. Adults who are at high risk for pneumococcal disease should obtain the PPSV23 vaccine at least 8 weeks after the dose of PCV13 vaccine. Adults older than 72 years of age who have normal immune system function should obtain the PPSV23 vaccine dose at least 1 year after the dose of PCV13 vaccine.  Pneumococcal polysaccharide (PPSV23) vaccine. When PCV13 is also indicated, PCV13 should be obtained first. All adults aged 65 years and older should be immunized. An adult younger than age 65 years who has certain medical conditions should be immunized. Any person who resides in a nursing home or long-term care facility should be immunized. An adult smoker should be immunized. People with an immunocompromised condition and certain other conditions should receive both PCV13 and PPSV23 vaccines. People with human immunodeficiency virus (HIV) infection should be immunized as soon as possible after diagnosis. Immunization during chemotherapy or radiation therapy should be avoided. Routine use of PPSV23 vaccine is not recommended for American Indians, [place] Natives, or people younger than 65 years unless there are medical conditions that require PPSV23 vaccine. When indicated, people who have unknown immunization and have no record of immunization should receive PPSV23 vaccine. One-time revaccination 5 years after the first dose of PPSV23 is recommended for people aged 19-64 years who have chronic kidney failure, nephrotic syndrome, asplenia, or immunocompromised conditions. People who received 1-2 doses of PPSV23 before age 65 years should receive another dose of PPSV23 vaccine at age 65 years or later if at least 5 years have passed since the previous dose. Doses of PPSV23 are not  needed for people immunized with PPSV23 at or after age 65 years.  Meningococcal vaccine. Adults with asplenia or persistent complement component deficiencies should receive 2 doses of quadrivalent meningococcal conjugate (MenACWY-D) vaccine. The doses should be obtained   at least 2 months apart. Microbiologists working with certain meningococcal bacteria, [place] recruits, people at risk during an outbreak, and people who travel to or live in countries with a high rate of meningitis should be immunized. A first-year college student up through age 34 years who is living in a residence hall should receive a dose if she did not receive a dose on or after her 16th birthday. Adults who have certain high-risk conditions should receive one or more doses of vaccine.  Hepatitis A vaccine. Adults who wish to be protected from this disease, have certain high-risk conditions, work with hepatitis A-infected animals, work in hepatitis A research labs, or travel to or work in countries with a high rate of hepatitis A should be immunized. Adults who were previously unvaccinated and who anticipate close contact with an international adoptee during the first 60 days after arrival in the [place] from a country with a high rate of hepatitis A should be immunized.  Hepatitis B vaccine. Adults who wish to be protected from this disease, have certain high-risk conditions, may be exposed to blood or other infectious body fluids, are household contacts or sex partners of hepatitis B positive people, are clients or workers in certain care facilities, or travel to or work in countries with a high rate of hepatitis B should be immunized.  Haemophilus influenzae type b (Hib) vaccine. A previously unvaccinated person with asplenia or sickle cell disease or having a scheduled splenectomy should receive 1 dose of Hib vaccine. Regardless of previous immunization, a recipient of a hematopoietic stem cell transplant should receive a  3-dose series 6-12 months after her successful transplant. Hib vaccine is not recommended for adults with HIV infection. Preventive Services / Frequency Ages 35 to 4 years  Blood pressure check.** / Every 3-5 years.  Lipid and cholesterol check.** / Every 5 years beginning at age 60.  Clinical breast exam.** / Every 3 years for women in their 71s and 10s.  BRCA-related cancer risk assessment.** / For women who have family members with a BRCA-related cancer (breast, ovarian, tubal, or peritoneal cancers).  Pap test.** / Every 2 years from ages 76 through 26. Every 3 years starting at age 61 through age 76 or 93 with a history of 3 consecutive normal Pap tests.  HPV screening.** / Every 3 years from ages 37 through ages 60 to 51 with a history of 3 consecutive normal Pap tests.  Hepatitis C blood test.** / For any individual with known risks for hepatitis C.  Skin self-exam. / Monthly.  Influenza vaccine. / Every year.  Tetanus, diphtheria, and acellular pertussis (Tdap, Td) vaccine.** / Consult your health care provider. Pregnant women should receive 1 dose of Tdap vaccine during each pregnancy. 1 dose of Td every 10 years.  Varicella vaccine.** / Consult your health care provider. Pregnant females who do not have evidence of immunity should receive the first dose after pregnancy.  HPV vaccine. / 3 doses over 6 months, if 93 and younger. The vaccine is not recommended for use in pregnant females. However, pregnancy testing is not needed before receiving a dose.  Measles, mumps, rubella (MMR) vaccine.** / You need at least 1 dose of MMR if you were born in 1957 or later. You may also need a 2nd dose. For females of childbearing age, rubella immunity should be determined. If there is no evidence of immunity, females who are not pregnant should be vaccinated. If there is no evidence of immunity, females who are  pregnant should delay immunization until after pregnancy.  Pneumococcal  13-valent conjugate (PCV13) vaccine.** / Consult your health care provider.  Pneumococcal polysaccharide (PPSV23) vaccine.** / 1 to 2 doses if you smoke cigarettes or if you have certain conditions.  Meningococcal vaccine.** / 1 dose if you are age 68 to 8 years and a Market researcher living in a residence hall, or have one of several medical conditions, you need to get vaccinated against meningococcal disease. You may also need additional booster doses.  Hepatitis A vaccine.** / Consult your health care provider.  Hepatitis B vaccine.** / Consult your health care provider.  Haemophilus influenzae type b (Hib) vaccine.** / Consult your health care provider. Ages 7 to 53 years  Blood pressure check.** / Every year.  Lipid and cholesterol check.** / Every 5 years beginning at age 25 years.  Lung cancer screening. / Every year if you are aged 11-80 years and have a 30-pack-year history of smoking and currently smoke or have quit within the past 15 years. Yearly screening is stopped once you have quit smoking for at least 15 years or develop a health problem that would prevent you from having lung cancer treatment.  Clinical breast exam.** / Every year after age 48 years.  BRCA-related cancer risk assessment.** / For women who have family members with a BRCA-related cancer (breast, ovarian, tubal, or peritoneal cancers).  Mammogram.** / Every year beginning at age 41 years and continuing for as long as you are in good health. Consult with your health care provider.  Pap test.** / Every 3 years starting at age 65 years through age 37 or 70 years with a history of 3 consecutive normal Pap tests.  HPV screening.** / Every 3 years from ages 72 years through ages 60 to 40 years with a history of 3 consecutive normal Pap tests.  Fecal occult blood test (FOBT) of stool. / Every year beginning at age 21 years and continuing until age 5 years. You may not need to do this test if you get  a colonoscopy every 10 years.  Flexible sigmoidoscopy or colonoscopy.** / Every 5 years for a flexible sigmoidoscopy or every 10 years for a colonoscopy beginning at age 35 years and continuing until age 48 years.  Hepatitis C blood test.** / For all people born from [birth year] through 1965 and any individual with known risks for hepatitis C.  Skin self-exam. / Monthly.  Influenza vaccine. / Every year.  Tetanus, diphtheria, and acellular pertussis (Tdap/Td) vaccine.** / Consult your health care provider. Pregnant women should receive 1 dose of Tdap vaccine during each pregnancy. 1 dose of Td every 10 years.  Varicella vaccine.** / Consult your health care provider. Pregnant females who do not have evidence of immunity should receive the first dose after pregnancy.  Zoster vaccine.** / 1 dose for adults aged 30 years or older.  Measles, mumps, rubella (MMR) vaccine.** / You need at least 1 dose of MMR if you were born in 1957 or later. You may also need a second dose. For females of childbearing age, rubella immunity should be determined. If there is no evidence of immunity, females who are not pregnant should be vaccinated. If there is no evidence of immunity, females who are pregnant should delay immunization until after pregnancy.  Pneumococcal 13-valent conjugate (PCV13) vaccine.** / Consult your health care provider.  Pneumococcal polysaccharide (PPSV23) vaccine.** / 1 to 2 doses if you smoke cigarettes or if you have certain conditions.  Meningococcal vaccine.** /  Consult your health care provider.  Hepatitis A vaccine.** / Consult your health care provider.  Hepatitis B vaccine.** / Consult your health care provider.  Haemophilus influenzae type b (Hib) vaccine.** / Consult your health care provider. Ages 64 years and over  Blood pressure check.** / Every year.  Lipid and cholesterol check.** / Every 5 years beginning at age 23 years.  Lung cancer screening. / Every year if you  are aged 16-80 years and have a 30-pack-year history of smoking and currently smoke or have quit within the past 15 years. Yearly screening is stopped once you have quit smoking for at least 15 years or develop a health problem that would prevent you from having lung cancer treatment.  Clinical breast exam.** / Every year after age 74 years.  BRCA-related cancer risk assessment.** / For women who have family members with a BRCA-related cancer (breast, ovarian, tubal, or peritoneal cancers).  Mammogram.** / Every year beginning at age 44 years and continuing for as long as you are in good health. Consult with your health care provider.  Pap test.** / Every 3 years starting at age 58 years through age 22 or 39 years with 3 consecutive normal Pap tests. Testing can be stopped between 65 and 70 years with 3 consecutive normal Pap tests and no abnormal Pap or HPV tests in the past 10 years.  HPV screening.** / Every 3 years from ages 64 years through ages 70 or 61 years with a history of 3 consecutive normal Pap tests. Testing can be stopped between 65 and 70 years with 3 consecutive normal Pap tests and no abnormal Pap or HPV tests in the past 10 years.  Fecal occult blood test (FOBT) of stool. / Every year beginning at age 40 years and continuing until age 27 years. You may not need to do this test if you get a colonoscopy every 10 years.  Flexible sigmoidoscopy or colonoscopy.** / Every 5 years for a flexible sigmoidoscopy or every 10 years for a colonoscopy beginning at age 7 years and continuing until age 32 years.  Hepatitis C blood test.** / For all people born from [birth year] through 1967 and any individual with known risks for hepatitis C.  Osteoporosis screening.** / A one-time screening for women ages 30 years and over and women at risk for fractures or osteoporosis.  Skin self-exam. / Monthly.  Influenza vaccine. / Every year.  Tetanus, diphtheria, and acellular pertussis (Tdap/Td)  vaccine.** / 1 dose of Td every 10 years.  Varicella vaccine.** / Consult your health care provider.  Zoster vaccine.** / 1 dose for adults aged 35 years or older.  Pneumococcal 13-valent conjugate (PCV13) vaccine.** / Consult your health care provider.  Pneumococcal polysaccharide (PPSV23) vaccine.** / 1 dose for all adults aged 46 years and older.  Meningococcal vaccine.** / Consult your health care provider.  Hepatitis A vaccine.** / Consult your health care provider.  Hepatitis B vaccine.** / Consult your health care provider.  Haemophilus influenzae type b (Hib) vaccine.** / Consult your health care provider. ** Family history and personal history of risk and conditions may change your health care provider's recommendations.   This information is not intended to replace advice given to you by your health care provider. Make sure you discuss any questions you have with your health care provider.   Document Released: 12/16/2001 Document Revised: 11/10/2014 Document Reviewed: 03/17/2011 Elsevier Interactive Patient Education [employer].

## 2016-04-17 NOTE — Telephone Encounter (Signed)
Left detailed message at number provided [phone], okay per ROI. Advised patient rx for Estrace has been sent to her pharmacy by Kem Boroughs, FNP with refills until her next aex. Advised to return call to the office with any further questions.  Routing to provider for final review. Patient agreeable to disposition. Will close encounter.

## 2016-04-18 ENCOUNTER — Encounter: Payer: Self-pay | Admitting: Internal Medicine

## 2016-04-19 NOTE — Progress Notes (Signed)
Encounter reviewed by Dr. Johncharles Fusselman Amundson C. Silva.  

## 2016-04-21 DIAGNOSIS — J301 Allergic rhinitis due to pollen: Secondary | ICD-10-CM | POA: Diagnosis not present

## 2016-04-21 DIAGNOSIS — J3089 Other allergic rhinitis: Secondary | ICD-10-CM | POA: Diagnosis not present

## 2016-04-21 NOTE — Assessment & Plan Note (Signed)

## 2016-05-19 DIAGNOSIS — J301 Allergic rhinitis due to pollen: Secondary | ICD-10-CM | POA: Diagnosis not present

## 2016-05-19 DIAGNOSIS — J3089 Other allergic rhinitis: Secondary | ICD-10-CM | POA: Diagnosis not present

## 2016-06-02 DIAGNOSIS — J3089 Other allergic rhinitis: Secondary | ICD-10-CM | POA: Diagnosis not present

## 2016-06-02 DIAGNOSIS — J301 Allergic rhinitis due to pollen: Secondary | ICD-10-CM | POA: Diagnosis not present

## 2016-06-12 DIAGNOSIS — J3089 Other allergic rhinitis: Secondary | ICD-10-CM | POA: Diagnosis not present

## 2016-06-12 DIAGNOSIS — J301 Allergic rhinitis due to pollen: Secondary | ICD-10-CM | POA: Diagnosis not present

## 2016-06-26 DIAGNOSIS — J301 Allergic rhinitis due to pollen: Secondary | ICD-10-CM | POA: Diagnosis not present

## 2016-06-26 DIAGNOSIS — J3089 Other allergic rhinitis: Secondary | ICD-10-CM | POA: Diagnosis not present

## 2016-07-21 DIAGNOSIS — J301 Allergic rhinitis due to pollen: Secondary | ICD-10-CM | POA: Diagnosis not present

## 2016-07-21 DIAGNOSIS — J3089 Other allergic rhinitis: Secondary | ICD-10-CM | POA: Diagnosis not present

## 2016-07-23 DIAGNOSIS — J3089 Other allergic rhinitis: Secondary | ICD-10-CM | POA: Diagnosis not present

## 2016-07-24 DIAGNOSIS — J209 Acute bronchitis, unspecified: Secondary | ICD-10-CM | POA: Diagnosis not present

## 2016-07-24 DIAGNOSIS — J301 Allergic rhinitis due to pollen: Secondary | ICD-10-CM | POA: Diagnosis not present

## 2016-07-24 DIAGNOSIS — R05 Cough: Secondary | ICD-10-CM | POA: Diagnosis not present

## 2016-07-24 DIAGNOSIS — J3089 Other allergic rhinitis: Secondary | ICD-10-CM | POA: Diagnosis not present

## 2016-08-14 DIAGNOSIS — J3089 Other allergic rhinitis: Secondary | ICD-10-CM | POA: Diagnosis not present

## 2016-08-14 DIAGNOSIS — J301 Allergic rhinitis due to pollen: Secondary | ICD-10-CM | POA: Diagnosis not present

## 2016-08-20 DIAGNOSIS — J3089 Other allergic rhinitis: Secondary | ICD-10-CM | POA: Diagnosis not present

## 2016-08-22 DIAGNOSIS — J3089 Other allergic rhinitis: Secondary | ICD-10-CM | POA: Diagnosis not present

## 2016-08-22 DIAGNOSIS — J301 Allergic rhinitis due to pollen: Secondary | ICD-10-CM | POA: Diagnosis not present

## 2016-08-27 DIAGNOSIS — J301 Allergic rhinitis due to pollen: Secondary | ICD-10-CM | POA: Diagnosis not present

## 2016-08-27 DIAGNOSIS — J3089 Other allergic rhinitis: Secondary | ICD-10-CM | POA: Diagnosis not present

## 2016-09-01 DIAGNOSIS — J3089 Other allergic rhinitis: Secondary | ICD-10-CM | POA: Diagnosis not present

## 2016-09-01 DIAGNOSIS — J301 Allergic rhinitis due to pollen: Secondary | ICD-10-CM | POA: Diagnosis not present

## 2016-09-15 DIAGNOSIS — J0100 Acute maxillary sinusitis, unspecified: Secondary | ICD-10-CM | POA: Diagnosis not present

## 2016-09-15 DIAGNOSIS — H1045 Other chronic allergic conjunctivitis: Secondary | ICD-10-CM | POA: Diagnosis not present

## 2016-09-15 DIAGNOSIS — J301 Allergic rhinitis due to pollen: Secondary | ICD-10-CM | POA: Diagnosis not present

## 2016-09-15 DIAGNOSIS — J3089 Other allergic rhinitis: Secondary | ICD-10-CM | POA: Diagnosis not present

## 2016-09-17 ENCOUNTER — Other Ambulatory Visit: Payer: Self-pay | Admitting: Internal Medicine

## 2016-09-17 DIAGNOSIS — E890 Postprocedural hypothyroidism: Secondary | ICD-10-CM

## 2016-09-29 DIAGNOSIS — J3089 Other allergic rhinitis: Secondary | ICD-10-CM | POA: Diagnosis not present

## 2016-09-29 DIAGNOSIS — J301 Allergic rhinitis due to pollen: Secondary | ICD-10-CM | POA: Diagnosis not present

## 2016-10-06 DIAGNOSIS — J301 Allergic rhinitis due to pollen: Secondary | ICD-10-CM | POA: Diagnosis not present

## 2016-10-06 DIAGNOSIS — J3089 Other allergic rhinitis: Secondary | ICD-10-CM | POA: Diagnosis not present

## 2016-10-10 ENCOUNTER — Other Ambulatory Visit: Payer: Self-pay | Admitting: Internal Medicine

## 2016-10-10 DIAGNOSIS — E890 Postprocedural hypothyroidism: Secondary | ICD-10-CM

## 2016-10-13 DIAGNOSIS — J3089 Other allergic rhinitis: Secondary | ICD-10-CM | POA: Diagnosis not present

## 2016-10-13 DIAGNOSIS — J301 Allergic rhinitis due to pollen: Secondary | ICD-10-CM | POA: Diagnosis not present

## 2016-11-06 ENCOUNTER — Other Ambulatory Visit: Payer: Self-pay | Admitting: Nurse Practitioner

## 2016-11-07 NOTE — Telephone Encounter (Signed)
Medication refill request: Zolpidem Last AEX:  04/16/16 PG Next AEX: 04/20/17 PG Last MMG (if hormonal medication request): 04/14/16 BIRADS1, Density C, [facility] Refill authorized: 04/16/16 #[id number]. Please advise. Thank you.

## 2016-11-10 ENCOUNTER — Telehealth: Payer: Self-pay | Admitting: Nurse Practitioner

## 2016-11-10 ENCOUNTER — Other Ambulatory Visit: Payer: Self-pay | Admitting: Nurse Practitioner

## 2016-11-10 NOTE — Telephone Encounter (Signed)
Left message for patient to call back. The RX was not denied by our office. -eh

## 2016-11-10 NOTE — Telephone Encounter (Signed)
Patient called requesting to speak with a nurse about her refill request for Ambien.  She said, "It was denied but I am not sure why.  I can't tell if your office denied it or if the insurance company denied it.  Will you help me please?"

## 2016-11-11 NOTE — Telephone Encounter (Signed)
Left patient another message to return call -eh

## 2016-11-13 DIAGNOSIS — J3089 Other allergic rhinitis: Secondary | ICD-10-CM | POA: Diagnosis not present

## 2016-11-13 DIAGNOSIS — J301 Allergic rhinitis due to pollen: Secondary | ICD-10-CM | POA: Diagnosis not present

## 2016-11-25 DIAGNOSIS — L438 Other lichen planus: Secondary | ICD-10-CM | POA: Diagnosis not present

## 2016-11-25 DIAGNOSIS — L821 Other seborrheic keratosis: Secondary | ICD-10-CM | POA: Diagnosis not present

## 2016-11-25 DIAGNOSIS — L814 Other melanin hyperpigmentation: Secondary | ICD-10-CM | POA: Diagnosis not present

## 2016-11-25 DIAGNOSIS — L570 Actinic keratosis: Secondary | ICD-10-CM | POA: Diagnosis not present

## 2016-11-26 DIAGNOSIS — J3089 Other allergic rhinitis: Secondary | ICD-10-CM | POA: Diagnosis not present

## 2016-11-26 DIAGNOSIS — J301 Allergic rhinitis due to pollen: Secondary | ICD-10-CM | POA: Diagnosis not present

## 2016-12-07 DIAGNOSIS — N390 Urinary tract infection, site not specified: Secondary | ICD-10-CM | POA: Diagnosis not present

## 2016-12-07 DIAGNOSIS — R030 Elevated blood-pressure reading, without diagnosis of hypertension: Secondary | ICD-10-CM | POA: Diagnosis not present

## 2016-12-22 DIAGNOSIS — J301 Allergic rhinitis due to pollen: Secondary | ICD-10-CM | POA: Diagnosis not present

## 2016-12-22 DIAGNOSIS — J3089 Other allergic rhinitis: Secondary | ICD-10-CM | POA: Diagnosis not present

## 2016-12-31 IMAGING — CR DG HAND COMPLETE 3+V*R*
3 series · 3 of 3 positions shown · non-contrast
Comparison: None.

CLINICAL DATA: Right hand pain.

EXAM:
RIGHT HAND - COMPLETE 3+ VIEW

[view not recorded (1 of 3)]
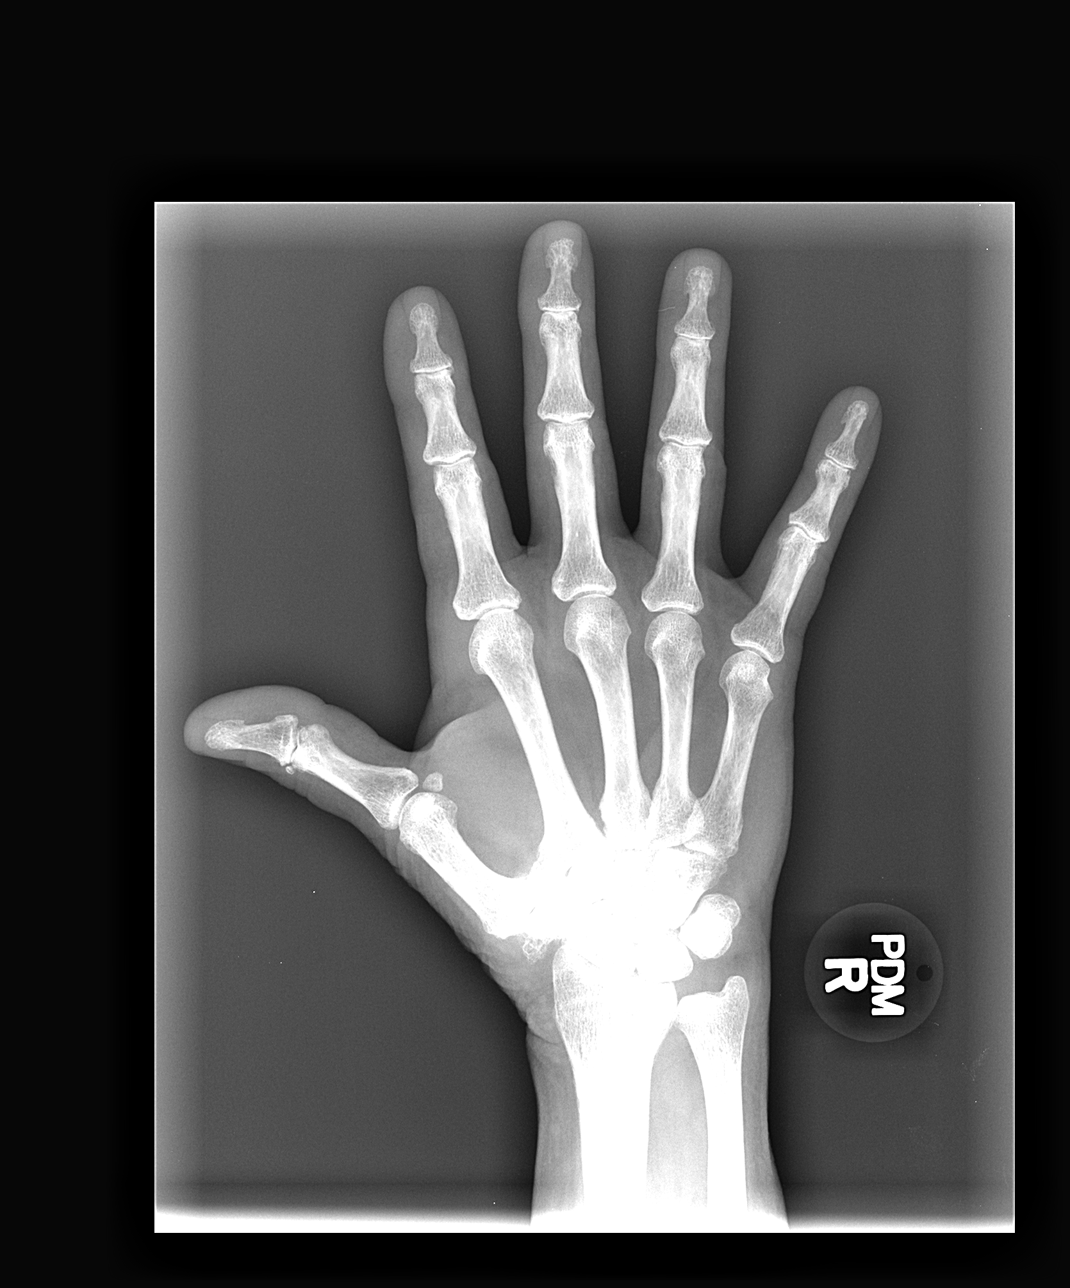

[view not recorded (2 of 3)]
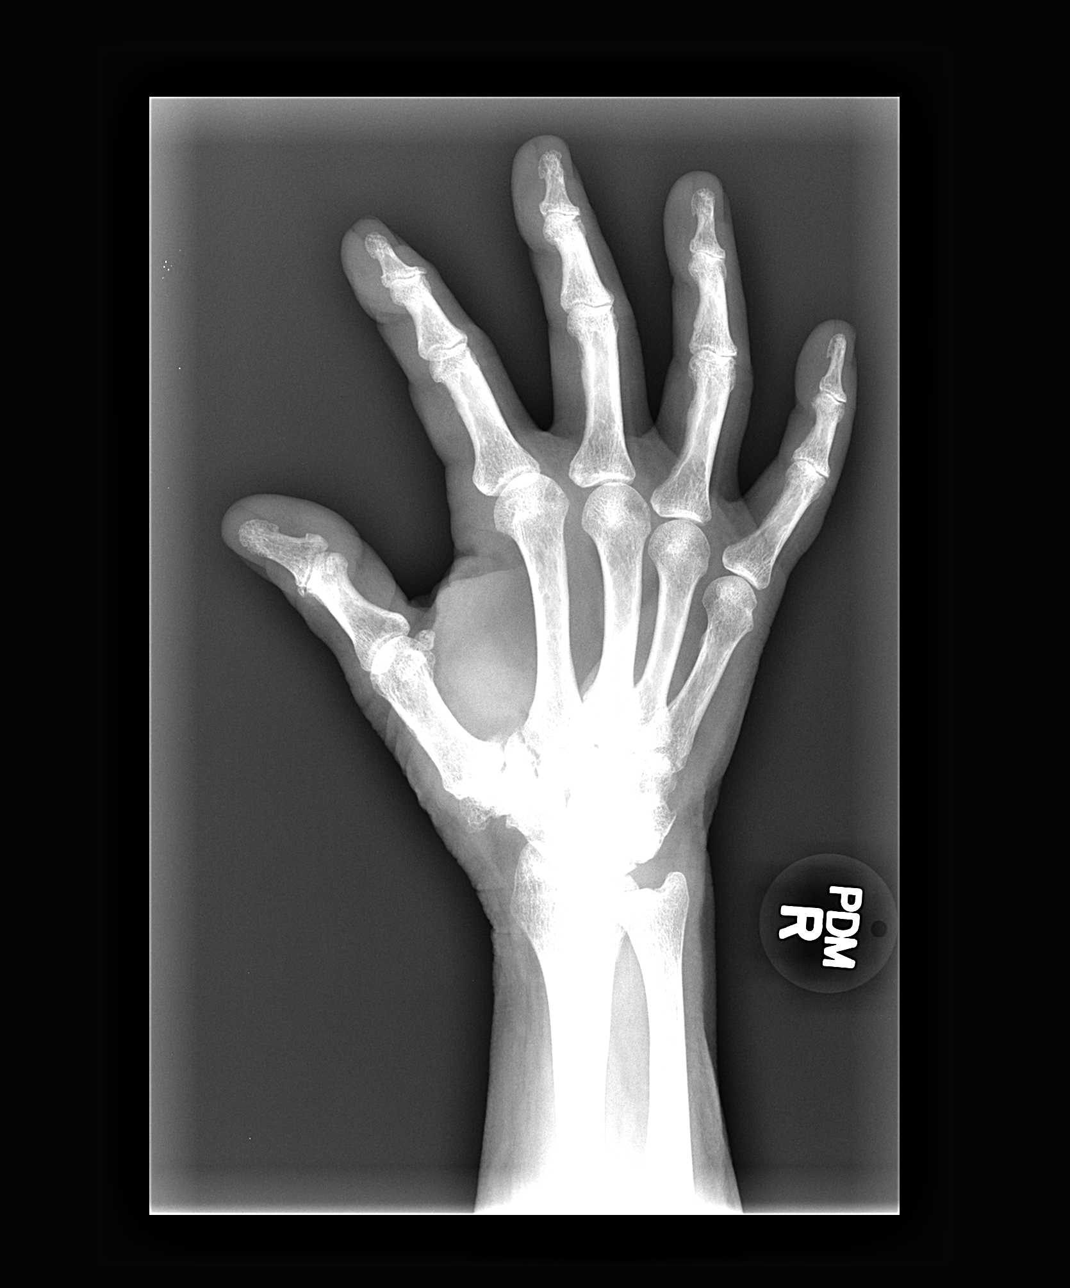

[view not recorded (3 of 3)]
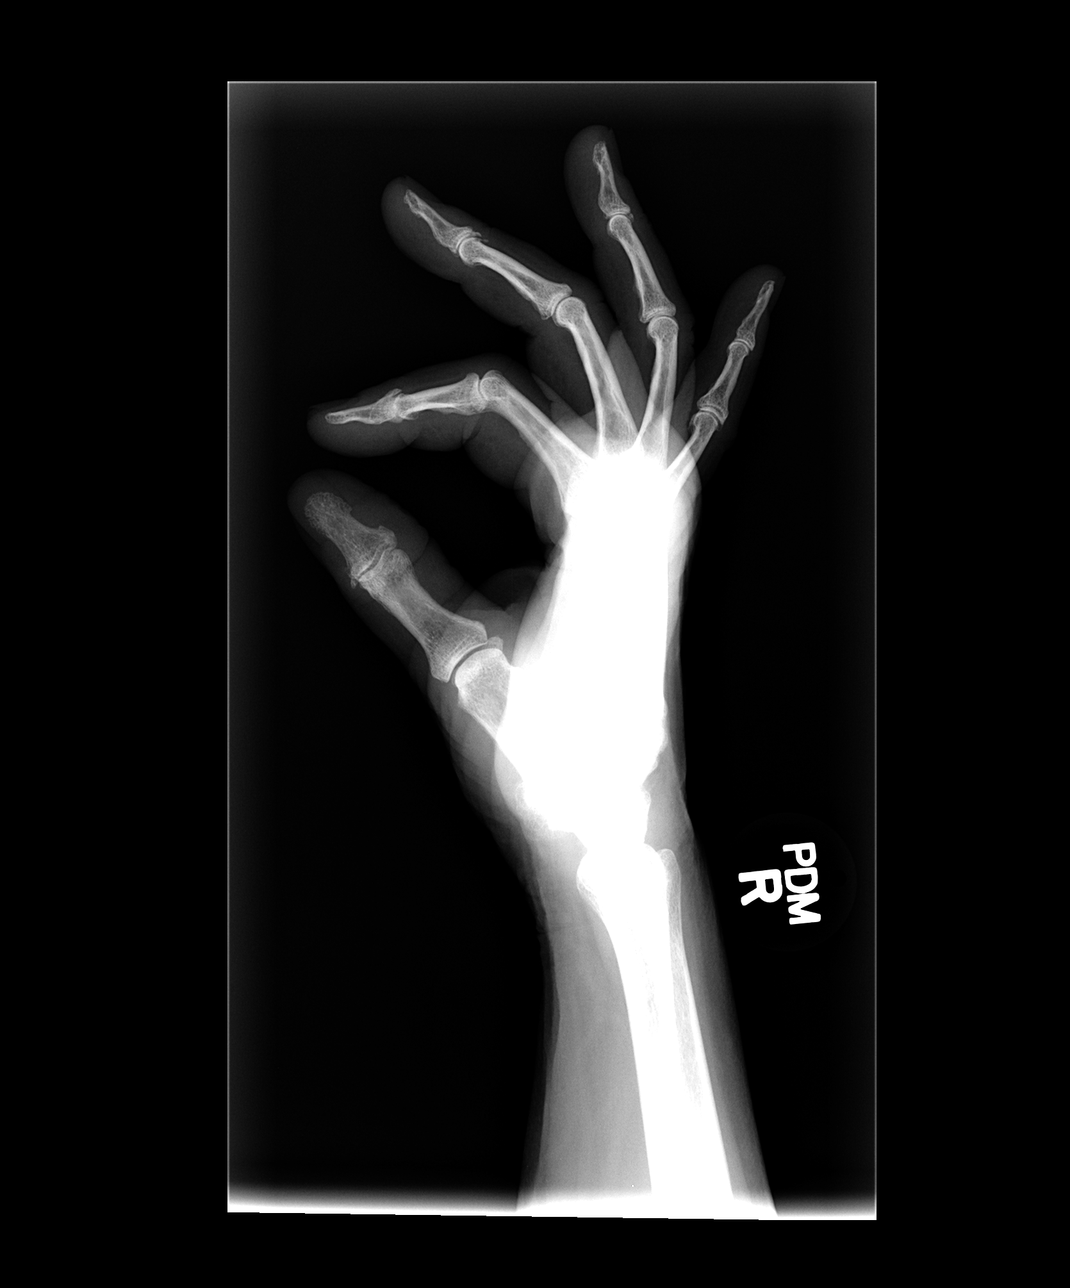

[3 of 3 positions shown; findings below may reference images not displayed]

FINDINGS: Moderate DIP and PIP joint osteoarthritis with joint space narrowing
and early spurring. There is also mild joint space narrowing at the
metacarpal phalangeal joints but no obvious erosive findings.
Advanced degenerative changes at the carpometacarpal joint of thumb.
The intercarpal joint spaces are fairly well maintained in the
radiocarpal joint is normal.
IMPRESSION: Osteoarthritic type degenerative changes involving the DIP and PIP
joints of the fingers and the carpometacarpal joint of the thumb.

No definite findings for an erosive arthropathy.

## 2016-12-31 IMAGING — CR DG HAND COMPLETE 3+V*L*
3 series · 3 of 3 positions shown · non-contrast
Comparison: None.

CLINICAL DATA: Carpal/metacarpal joint pain, redness, and swelling
for 5 days.

EXAM:
LEFT HAND - COMPLETE 3+ VIEW

[view not recorded (1 of 3)]
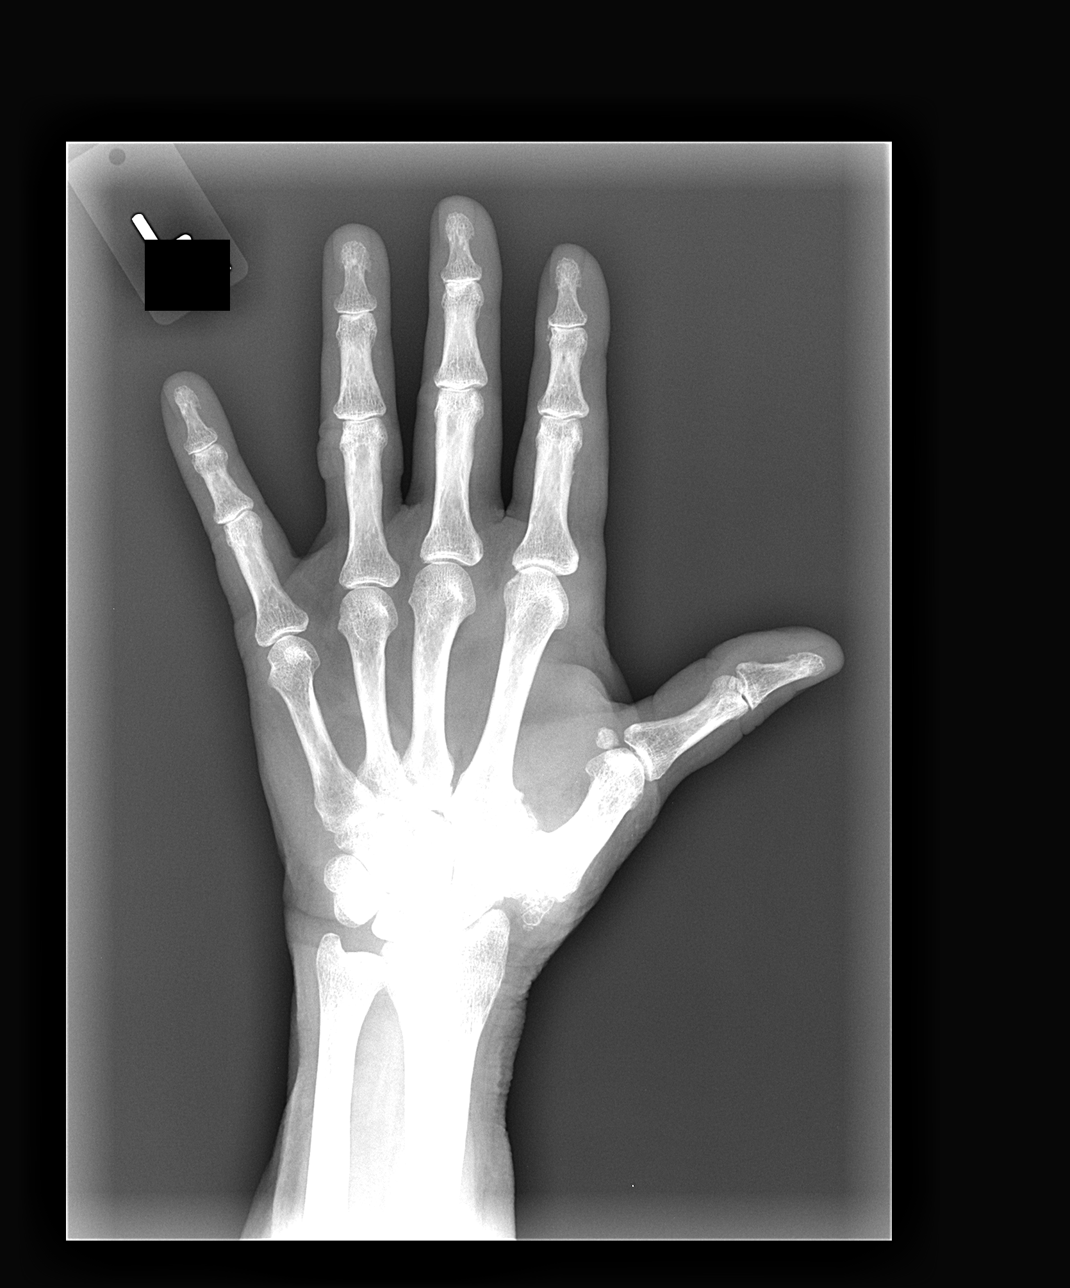

[view not recorded (2 of 3)]
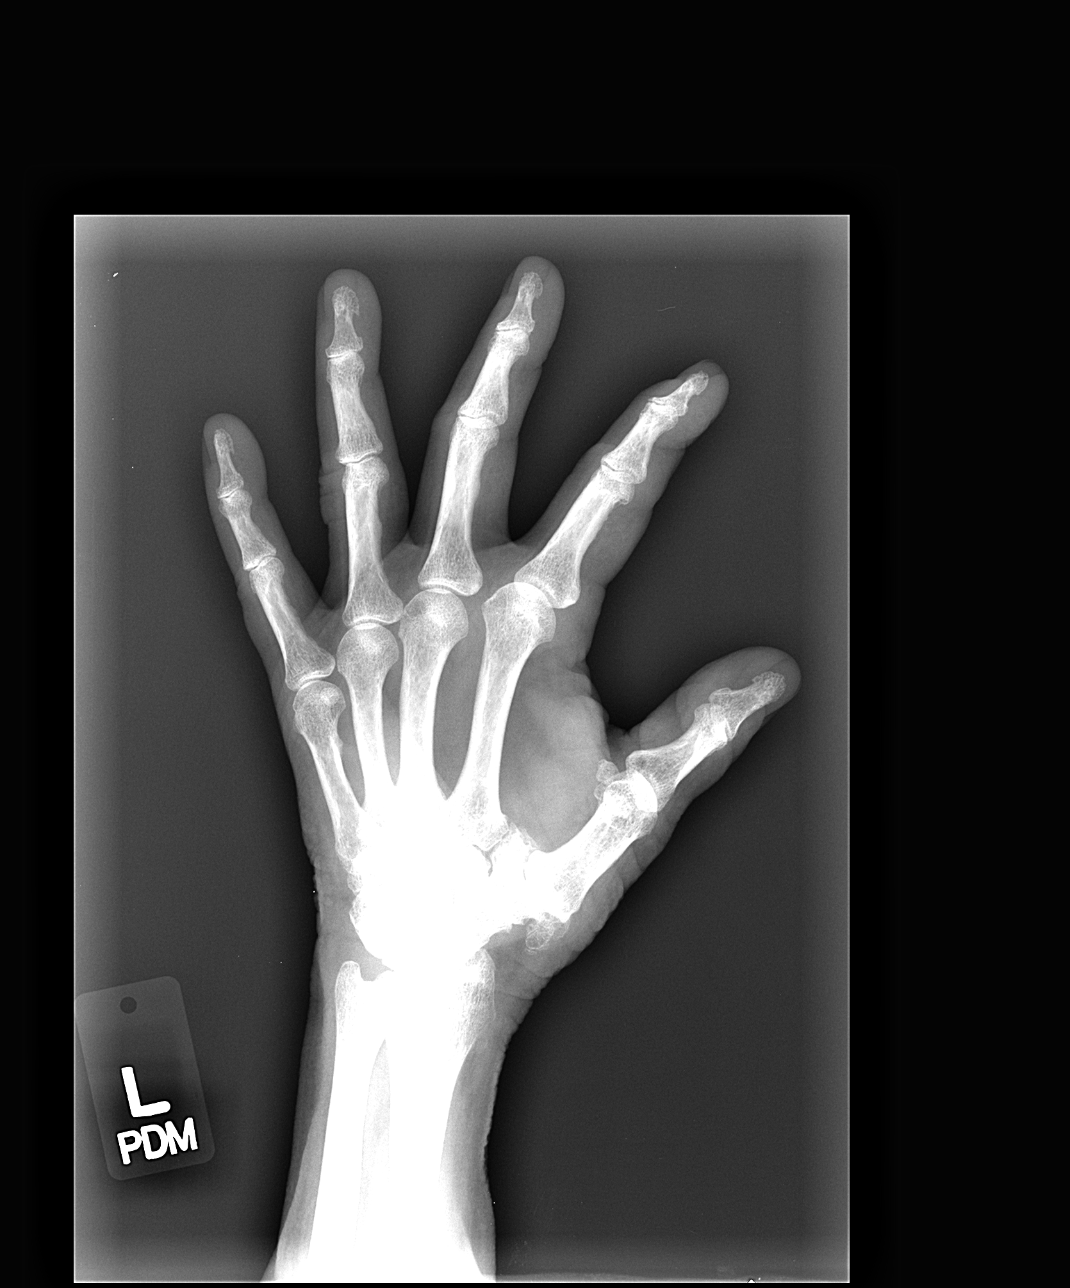

[view not recorded (3 of 3)]
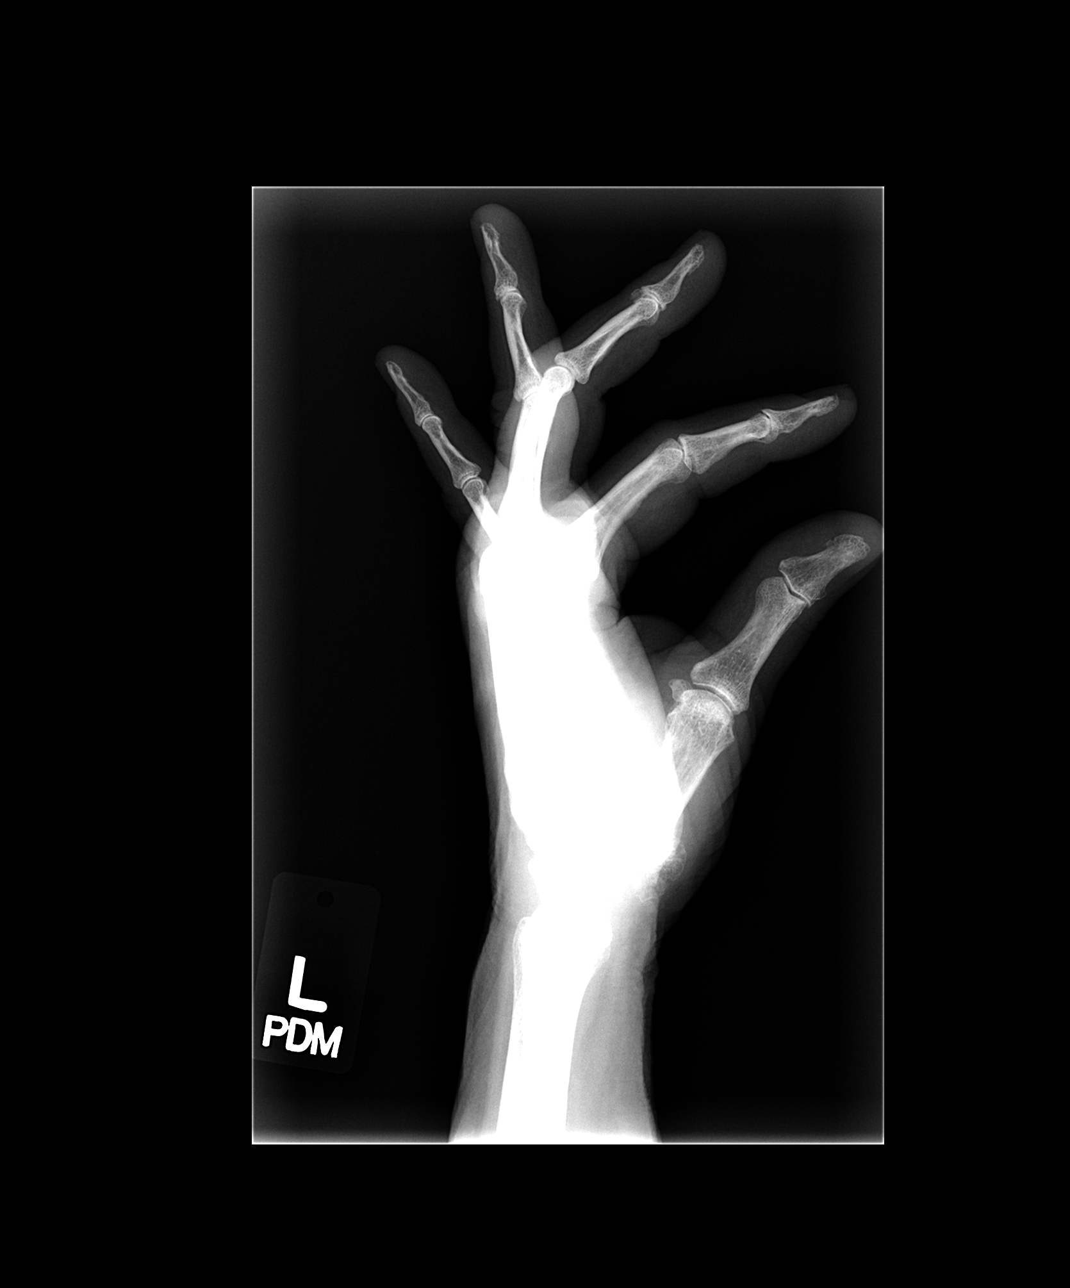

[3 of 3 positions shown; findings below may reference images not displayed]

FINDINGS: There is no evidence of fracture or dislocation. There is severe
degenerative joint changes of the first carpal/metacarpal joint with
narrowed joint space and osteophyte formation. There are osteophyte
formation with narrowed joint space of the second and third distal
interphalangeal joint consistent with osteoarthritis. Soft tissues
are unremarkable.
IMPRESSION: No acute fracture or dislocation.  Osteoarthritic changes.

## 2017-01-05 DIAGNOSIS — J301 Allergic rhinitis due to pollen: Secondary | ICD-10-CM | POA: Diagnosis not present

## 2017-01-05 DIAGNOSIS — J3089 Other allergic rhinitis: Secondary | ICD-10-CM | POA: Diagnosis not present

## 2017-01-20 DIAGNOSIS — J301 Allergic rhinitis due to pollen: Secondary | ICD-10-CM | POA: Diagnosis not present

## 2017-01-20 DIAGNOSIS — J3089 Other allergic rhinitis: Secondary | ICD-10-CM | POA: Diagnosis not present

## 2017-01-22 DIAGNOSIS — J301 Allergic rhinitis due to pollen: Secondary | ICD-10-CM | POA: Diagnosis not present

## 2017-02-10 DIAGNOSIS — J3089 Other allergic rhinitis: Secondary | ICD-10-CM | POA: Diagnosis not present

## 2017-02-10 DIAGNOSIS — J301 Allergic rhinitis due to pollen: Secondary | ICD-10-CM | POA: Diagnosis not present

## 2017-02-13 DIAGNOSIS — J301 Allergic rhinitis due to pollen: Secondary | ICD-10-CM | POA: Diagnosis not present

## 2017-02-19 DIAGNOSIS — J3089 Other allergic rhinitis: Secondary | ICD-10-CM | POA: Diagnosis not present

## 2017-02-19 DIAGNOSIS — Z961 Presence of intraocular lens: Secondary | ICD-10-CM | POA: Diagnosis not present

## 2017-02-19 DIAGNOSIS — H52202 Unspecified astigmatism, left eye: Secondary | ICD-10-CM | POA: Diagnosis not present

## 2017-02-19 DIAGNOSIS — H1045 Other chronic allergic conjunctivitis: Secondary | ICD-10-CM | POA: Diagnosis not present

## 2017-02-19 DIAGNOSIS — H353131 Nonexudative age-related macular degeneration, bilateral, early dry stage: Secondary | ICD-10-CM | POA: Diagnosis not present

## 2017-02-19 DIAGNOSIS — J301 Allergic rhinitis due to pollen: Secondary | ICD-10-CM | POA: Diagnosis not present

## 2017-02-19 DIAGNOSIS — H5212 Myopia, left eye: Secondary | ICD-10-CM | POA: Diagnosis not present

## 2017-02-19 DIAGNOSIS — R05 Cough: Secondary | ICD-10-CM | POA: Diagnosis not present

## 2017-03-11 DIAGNOSIS — J3089 Other allergic rhinitis: Secondary | ICD-10-CM | POA: Diagnosis not present

## 2017-03-11 DIAGNOSIS — J301 Allergic rhinitis due to pollen: Secondary | ICD-10-CM | POA: Diagnosis not present

## 2017-03-16 DIAGNOSIS — J3089 Other allergic rhinitis: Secondary | ICD-10-CM | POA: Diagnosis not present

## 2017-03-16 DIAGNOSIS — J301 Allergic rhinitis due to pollen: Secondary | ICD-10-CM | POA: Diagnosis not present

## 2017-03-25 DIAGNOSIS — J301 Allergic rhinitis due to pollen: Secondary | ICD-10-CM | POA: Diagnosis not present

## 2017-03-25 DIAGNOSIS — J3089 Other allergic rhinitis: Secondary | ICD-10-CM | POA: Diagnosis not present

## 2017-03-27 DIAGNOSIS — J301 Allergic rhinitis due to pollen: Secondary | ICD-10-CM | POA: Diagnosis not present

## 2017-04-14 DIAGNOSIS — J301 Allergic rhinitis due to pollen: Secondary | ICD-10-CM | POA: Diagnosis not present

## 2017-04-14 DIAGNOSIS — J3089 Other allergic rhinitis: Secondary | ICD-10-CM | POA: Diagnosis not present

## 2017-04-15 DIAGNOSIS — Z803 Family history of malignant neoplasm of breast: Secondary | ICD-10-CM | POA: Diagnosis not present

## 2017-04-15 DIAGNOSIS — Z1231 Encounter for screening mammogram for malignant neoplasm of breast: Secondary | ICD-10-CM | POA: Diagnosis not present

## 2017-04-15 LAB — HM MAMMOGRAPHY

## 2017-04-20 ENCOUNTER — Ambulatory Visit (INDEPENDENT_AMBULATORY_CARE_PROVIDER_SITE_OTHER): Payer: PPO | Admitting: Nurse Practitioner

## 2017-04-20 ENCOUNTER — Encounter: Payer: Self-pay | Admitting: Nurse Practitioner

## 2017-04-20 VITALS — BP 130/70 | HR 64 | Resp 12 | Ht 59.5 in | Wt 140.4 lb

## 2017-04-20 DIAGNOSIS — Z0000 Encounter for general adult medical examination without abnormal findings: Secondary | ICD-10-CM

## 2017-04-20 DIAGNOSIS — Z01419 Encounter for gynecological examination (general) (routine) without abnormal findings: Secondary | ICD-10-CM | POA: Diagnosis not present

## 2017-04-20 MED ORDER — ESTRADIOL 0.1 MG/GM VA CREA: TOPICAL_CREAM | VAGINAL | 3 refills | Status: DC

## 2017-04-20 NOTE — Progress Notes (Signed)
Reviewed personally.  M. Suzanne Ramata Strothman, MD.  

## 2017-04-20 NOTE — Patient Instructions (Addendum)

## 2017-04-20 NOTE — Progress Notes (Signed)
Patient ID: Maria Montgomery, female   DOB: October 25, 1946, 71 y.o.   MRN: [id number]  71 y.o. [id number] Married  Caucasian Fe here for annual exam.  No new problems.  Having PCP visit tomorrow and getting labs there.  No recent UTI symptoms.  Patient's last menstrual period was 07/28/1987.          Sexually active: Yes.    The current method of family planning is status post hysterectomy.    Exercising: Yes.    Swim, Aerobics, Weights Smoker:  no  Health Maintenance: Pap: 04/10/14, Negative (Hysterectomy)  2005, Negative History of Abnormal Pap: no MMG: 04/15/2017, 3D-yes, Density Category , Bi-Rads :  Negative Self Breast exams: yes Colonoscopy: 06/16/12, Mild Diverticulosis, repeat in 10 years BMD: 04/23/15 T Score: -0.8 Spine / -0.9 Right Femur Neck / -0.6 Left Femur Neck, repeat in 5 years TDaP: 02/2010 Shingles: 12/2006 Pneumonia: 04/17/16 Pneumovax, 04/12/14 Prevnar-13, 08/25/07 Pneumovax Hep C: 04/17/16 Labs: PCP takes care of all labs [facility])    reports that she has never smoked. She has never used smokeless tobacco. She reports that she drinks about 4.2 oz of alcohol per week . She reports that she does not use drugs.  Past Medical History:  Diagnosis Date  . Allergic rhinitis   . Blood transfusion    after tonsillectomy as child; and after hysterectomy  . Diverticulosis 2003  . Hypothyroidism 12/2006  . Vitamin D deficiency     Past Surgical History:  Procedure Laterality Date  . ABDOMINAL HYSTERECTOMY    . BLEPHAROPLASTY    . BUNIONECTOMY     hammertoe right '01, hammertoe left '06  . COLONOSCOPY  06/15/2012  . TONSILLECTOMY    . TUBAL LIGATION    . VAGINAL HYSTERECTOMY  1988   had prolapse and blood transfusion    Current Outpatient Prescriptions  Medication Sig Dispense Refill  . Azelastine HCl (ASTEPRO) 0.15 % SOLN by Nasal route as needed.      Marland Kitchen BEPREVE 1.5 % SOLN Place 1.5 drops into both eyes as needed.    . Biotin 5000 MCG CAPS Take 20,000 mcg by mouth daily.     .  Cetirizine HCl (ZYRTEC ALLERGY) 10 MG CAPS Take by mouth as needed. Reported on 11/21/2015    . cholecalciferol (VITAMIN D) 1000 UNITS tablet Take 5,000 Units by mouth daily.     Marland Kitchen estradiol (ESTRACE) 0.1 MG/GM vaginal cream Use 1/2 g vaginally twice weekly 42.5 g 3  . levothyroxine (SYNTHROID, LEVOTHROID) 50 MCG tablet TAKE 1 TABLET BY MOUTH EVERY DAY BEFORE BREAKFAST 90 tablet 1  . Multiple Vitamins-Minerals (CENTRUM SILVER PO) Take by mouth daily.      . ranitidine (ZANTAC) 150 MG tablet Take 150 mg by mouth as needed.      . zolpidem (AMBIEN) 10 MG tablet TAKE 1 TABLET BY MOUTH EVERY DAY AT BEDTIME AS NEEDED FOR SLEEP 30 tablet 5   No current facility-administered medications for this visit.     Family History  Problem Relation Age of Onset  . Coronary artery disease Mother   . Breast cancer Mother   . Thyroid disease Mother   . Hypertension Mother   . Osteoporosis Mother   . Lung cancer Father        age 74  . Diabetes Paternal Uncle   . Colon cancer Neg Hx   . Stomach cancer Neg Hx   . Asthma Brother   . Epilepsy Brother   . Diabetes Maternal Grandmother   .  Multiple births Paternal Grandmother     ROS:  Pertinent items are noted in HPI.  Otherwise, a comprehensive ROS was negative.  Exam:   BP 130/70 (BP Location: Right Arm, Patient Position: Sitting, Cuff Size: Normal)   Pulse 64   Resp 12   Ht 4' 11.5" (1.511 m)   Wt 140 lb 6.4 oz (63.7 kg)   LMP 07/28/1987   BMI 27.88 kg/m  Height: 4' 11.5" (151.1 cm) Ht Readings from Last 3 Encounters:  04/20/17 4' 11.5" (1.511 m)  04/17/16 4' 11.5" (1.511 m)  04/16/16 4' 11.5" (1.511 m)    General appearance: alert, cooperative and appears stated age Head: Normocephalic, without obvious abnormality, atraumatic Neck: no adenopathy, supple, symmetrical, trachea midline and thyroid normal to inspection and palpation Lungs: clear to auscultation bilaterally Breasts: normal appearance, no masses or tenderness Heart: regular  rate and rhythm Abdomen: soft, non-tender; no masses,  no organomegaly Extremities: extremities normal, atraumatic, no cyanosis or edema Skin: Skin color, texture, turgor normal. No rashes or lesions Lymph nodes: Cervical, supraclavicular, and axillary nodes normal. No abnormal inguinal nodes palpated Neurologic: Grossly normal   Pelvic: External genitalia:  no lesions              Urethra:  normal appearing urethra with no masses, tenderness or lesions              Bartholin's and Skene's: normal                 Vagina: normal appearing vagina with normal color and discharge, no lesions              Cervix: absent              Pap taken: No. Bimanual Exam:  Uterus:  uterus absent              Adnexa: no mass, fullness, tenderness               Rectovaginal: Confirms               Anus:  normal sphincter tone, no lesions  Chaperone present: no  A:  Well Woman with normal exam  S/P TVH secondary to prolapse  Postmenopausal on ERT from 1993 - fall 2015  History of atrophic vaginitis on vaginal estrogen   P:   Reviewed health and wellness pertinent to exam  Pap smear: no  Mammogram is due 04/2018  Refill on vaginal estrogen for a year  Counseled with risk of DVT, CVA, cancer, etc.  Pt will get Vit D done at PCP  Advised pt to get Ambien from PCP  Counseled on breast self exam, mammography screening, use and side effects of HRT, adequate intake of calcium and vitamin D, diet and exercise, Kegel's exercises return annually or prn  An After Visit Summary was printed and given to the patient.

## 2017-04-21 ENCOUNTER — Encounter: Payer: Self-pay | Admitting: Internal Medicine

## 2017-04-21 ENCOUNTER — Ambulatory Visit (INDEPENDENT_AMBULATORY_CARE_PROVIDER_SITE_OTHER): Payer: PPO | Admitting: Internal Medicine

## 2017-04-21 ENCOUNTER — Other Ambulatory Visit (INDEPENDENT_AMBULATORY_CARE_PROVIDER_SITE_OTHER): Payer: PPO

## 2017-04-21 VITALS — BP 114/76 | HR 72 | Temp 98.4°F | Resp 16 | Ht 59.5 in | Wt 139.0 lb

## 2017-04-21 DIAGNOSIS — Z0000 Encounter for general adult medical examination without abnormal findings: Secondary | ICD-10-CM | POA: Diagnosis not present

## 2017-04-21 DIAGNOSIS — E038 Other specified hypothyroidism: Secondary | ICD-10-CM

## 2017-04-21 DIAGNOSIS — M8580 Other specified disorders of bone density and structure, unspecified site: Secondary | ICD-10-CM

## 2017-04-21 DIAGNOSIS — F5101 Primary insomnia: Secondary | ICD-10-CM

## 2017-04-21 LAB — CBC WITH DIFFERENTIAL/PLATELET
BASOS ABS: 0.1 10*3/uL (ref 0.0–0.1)
Basophils Relative: 1.2 % (ref 0.0–3.0)
EOS ABS: 0.1 10*3/uL (ref 0.0–0.7)
Eosinophils Relative: 2.3 % (ref 0.0–5.0)
HCT: 41.8 % (ref 36.0–46.0)
Hemoglobin: 14 g/dL (ref 12.0–15.0)
LYMPHS ABS: 1.5 10*3/uL (ref 0.7–4.0)
LYMPHS PCT: 28.7 % (ref 12.0–46.0)
MCHC: 33.4 g/dL (ref 30.0–36.0)
MCV: 92.9 fl (ref 78.0–100.0)
MONOS PCT: 15.3 % — AB (ref 3.0–12.0)
Monocytes Absolute: 0.8 10*3/uL (ref 0.1–1.0)
NEUTROS PCT: 52.5 % (ref 43.0–77.0)
Neutro Abs: 2.8 10*3/uL (ref 1.4–7.7)
PLATELETS: 252 10*3/uL (ref 150.0–400.0)
RBC: 4.5 Mil/uL (ref 3.87–5.11)
RDW: 12.5 % (ref 11.5–15.5)
WBC: 5.4 10*3/uL (ref 4.0–10.5)

## 2017-04-21 LAB — COMPREHENSIVE METABOLIC PANEL
ALK PHOS: 53 U/L (ref 39–117)
ALT: 16 U/L (ref 0–35)
AST: 19 U/L (ref 0–37)
Albumin: 4.3 g/dL (ref 3.5–5.2)
BILIRUBIN TOTAL: 0.7 mg/dL (ref 0.2–1.2)
BUN: 20 mg/dL (ref 6–23)
CALCIUM: 9.6 mg/dL (ref 8.4–10.5)
CO2: 29 mEq/L (ref 19–32)
CREATININE: 0.89 mg/dL (ref 0.40–1.20)
Chloride: 103 mEq/L (ref 96–112)
GFR: 66.03 mL/min (ref >60.00)
GLUCOSE: 96 mg/dL (ref 70–99)
Potassium: 4.2 mEq/L (ref 3.5–5.1)
Sodium: 140 mEq/L (ref 135–145)
TOTAL PROTEIN: 6.7 g/dL (ref 6.0–8.3)

## 2017-04-21 LAB — TSH: TSH: 2.76 u[IU]/mL (ref 0.35–4.50)

## 2017-04-21 LAB — VITAMIN D 25 HYDROXY (VIT D DEFICIENCY, FRACTURES): VITD: 45.91 ng/mL (ref 30.00–100.00)

## 2017-04-21 LAB — LIPID PANEL
CHOLESTEROL: 197 mg/dL (ref 0–200)
HDL: 70.6 mg/dL (ref >39.00)
LDL Cholesterol: 104 mg/dL — ABNORMAL HIGH (ref 0–99)
NonHDL: 126.4
TRIGLYCERIDES: 112 mg/dL (ref 0.0–149.0)
Total CHOL/HDL Ratio: 3
VLDL: 22.4 mg/dL (ref 0.0–40.0)

## 2017-04-21 MED ORDER — SUVOREXANT 10 MG PO TABS: 1.0000 | ORAL_TABLET | Freq: Every day | ORAL | 5 refills | Status: DC

## 2017-04-21 MED ORDER — ZOSTER VAC RECOMB ADJUVANTED 50 MCG/0.5ML IM SUSR: 0.5000 mL | Freq: Once | INTRAMUSCULAR | 1 refills | Status: AC

## 2017-04-21 NOTE — Progress Notes (Signed)
Subjective:  Patient ID: Maria Montgomery, female    DOB: Apr 06, 1946  Age: 71 y.o. MRN: [id number]  CC: Hypothyroidism and Annual Exam   HPI [name] presents for a CPX.  She takes Ambien chronically for insomnia and wants to know if she can try something else. Sometimes taking Ambien gives her a hangover the next day. She is also due for follow-up on hypothyroidism. She's had a good energy level and denies any changes in her weight or bowel movements.  Past Medical History:  Diagnosis Date  . Allergic rhinitis   . Blood transfusion    after tonsillectomy as child; and after hysterectomy  . Diverticulosis 2003  . Hypothyroidism 12/2006  . Vitamin D deficiency    Past Surgical History:  Procedure Laterality Date  . ABDOMINAL HYSTERECTOMY    . BLEPHAROPLASTY    . BUNIONECTOMY     hammertoe right '01, hammertoe left '06  . COLONOSCOPY  06/15/2012  . TONSILLECTOMY    . TUBAL LIGATION    . VAGINAL HYSTERECTOMY  1988   had prolapse and blood transfusion    reports that she has never smoked. She has never used smokeless tobacco. She reports that she drinks about 4.2 oz of alcohol per week . She reports that she does not use drugs. family history includes Asthma in her brother; Breast cancer in her mother; Coronary artery disease in her mother; Diabetes in her maternal grandmother and paternal uncle; Epilepsy in her brother; Hypertension in her mother; Lung cancer in her father; Multiple births in her paternal grandmother; Osteoporosis in her mother; Thyroid disease in her mother. Allergies  Allergen Reactions  . Morphine And Related Swelling    Outpatient Medications Prior to Visit  Medication Sig Dispense Refill  . Azelastine HCl (ASTEPRO) 0.15 % SOLN by Nasal route as needed.      Marland Kitchen BEPREVE 1.5 % SOLN Place 1.5 drops into both eyes as needed.    . Biotin 5000 MCG CAPS Take 20,000 mcg by mouth daily.     . Cetirizine HCl (ZYRTEC ALLERGY) 10 MG CAPS Take by mouth as needed.  Reported on 11/21/2015    . cholecalciferol (VITAMIN D) 1000 UNITS tablet Take 5,000 Units by mouth daily.     Marland Kitchen estradiol (ESTRACE) 0.1 MG/GM vaginal cream Use 1/2 g vaginally twice weekly 42.5 g 3  . levothyroxine (SYNTHROID, LEVOTHROID) 50 MCG tablet TAKE 1 TABLET BY MOUTH EVERY DAY BEFORE BREAKFAST 90 tablet 1  . Multiple Vitamins-Minerals (CENTRUM SILVER PO) Take by mouth daily.      . ranitidine (ZANTAC) 150 MG tablet Take 150 mg by mouth as needed.      . zolpidem (AMBIEN) 10 MG tablet TAKE 1 TABLET BY MOUTH EVERY DAY AT BEDTIME AS NEEDED FOR SLEEP 30 tablet 5   No facility-administered medications prior to visit.     ROS Review of Systems  Constitutional: Negative.  Negative for activity change, appetite change, diaphoresis, fatigue and unexpected weight change.  HENT: Negative.  Negative for trouble swallowing.   Eyes: Negative.  Negative for visual disturbance.  Respiratory: Negative.  Negative for cough, chest tightness, shortness of breath and wheezing.   Cardiovascular: Negative.  Negative for chest pain, palpitations and leg swelling.  Gastrointestinal: Negative.  Negative for abdominal pain, blood in stool, constipation, diarrhea, nausea and vomiting.  Endocrine: Negative for cold intolerance and heat intolerance.  Genitourinary: Negative.  Negative for difficulty urinating.  Musculoskeletal: Negative.  Negative for arthralgias, back pain, myalgias and  neck pain.  Skin: Negative.  Negative for color change and rash.  Allergic/Immunologic: Negative.   Neurological: Negative.  Negative for dizziness, weakness, numbness and headaches.  Hematological: Negative for adenopathy. Does not bruise/bleed easily.  Psychiatric/Behavioral: Positive for sleep disturbance. Negative for behavioral problems, confusion, decreased concentration, dysphoric mood and suicidal ideas. The patient is not nervous/anxious.     Objective:  BP 114/76 (BP Location: Left Arm, Patient Position: Sitting,  Cuff Size: Large)   Pulse 72   Temp 98.4 F (36.9 C) (Oral)   Resp 16   Ht 4' 11.5" (1.511 m)   Wt 139 lb (63 kg)   LMP 07/28/1987   SpO2 98%   BMI 27.60 kg/m   BP Readings from Last 3 Encounters:  04/21/17 114/76  04/20/17 130/70  04/17/16 120/80    Wt Readings from Last 3 Encounters:  04/21/17 139 lb (63 kg)  04/20/17 140 lb 6.4 oz (63.7 kg)  04/17/16 141 lb (64 kg)    Physical Exam  Constitutional: She is oriented to person, place, and time. No distress.  HENT:  Mouth/Throat: Oropharynx is clear and moist. No oropharyngeal exudate.  Eyes: Conjunctivae are normal. Right eye exhibits no discharge. Left eye exhibits no discharge. No scleral icterus.  Neck: Normal range of motion. Neck supple. No JVD present. No thyromegaly present.  Cardiovascular: Normal rate, regular rhythm and intact distal pulses.  Exam reveals no gallop and no friction rub.   No murmur heard. Pulmonary/Chest: Effort normal and breath sounds normal. No respiratory distress. She has no wheezes. She has no rales. She exhibits no tenderness.  Abdominal: Soft. Bowel sounds are normal. She exhibits no distension and no mass. There is no tenderness. There is no rebound and no guarding.  Musculoskeletal: Normal range of motion. She exhibits no edema, tenderness or deformity.  Lymphadenopathy:    She has no cervical adenopathy.  Neurological: She is alert and oriented to person, place, and time.  Skin: Skin is warm and dry. No rash noted. She is not diaphoretic. No erythema. No pallor.  Psychiatric: She has a normal mood and affect. Her behavior is normal. Judgment and thought content normal.  Vitals reviewed.   Lab Results  Component Value Date   WBC 5.4 04/21/2017   HGB 14.0 04/21/2017   HCT 41.8 04/21/2017   PLT 252.0 04/21/2017   GLUCOSE 96 04/21/2017   CHOL 197 04/21/2017   TRIG 112.0 04/21/2017   HDL 70.60 04/21/2017   LDLDIRECT 97.6 02/05/2009   LDLCALC 104 (H) 04/21/2017   ALT 16 04/21/2017     AST 19 04/21/2017   NA 140 04/21/2017   K 4.2 04/21/2017   CL 103 04/21/2017   CREATININE 0.89 04/21/2017   BUN 20 04/21/2017   CO2 29 04/21/2017   TSH 2.76 04/21/2017    Dg Hand Complete Left  Result Date: 05/15/2015 CLINICAL DATA:  Carpal/metacarpal joint pain, redness, and swelling for 5 days. EXAM: LEFT HAND - COMPLETE 3+ VIEW COMPARISON:  None. FINDINGS: There is no evidence of fracture or dislocation. There is severe degenerative joint changes of the first carpal/metacarpal joint with narrowed joint space and osteophyte formation. There are osteophyte formation with narrowed joint space of the second and third distal interphalangeal joint consistent with osteoarthritis. Soft tissues are unremarkable. IMPRESSION: No acute fracture or dislocation.  Osteoarthritic changes. Electronically Signed   By: Abelardo Diesel M.D.   On: 05/15/2015 08:17   Dg Hand Complete Right  Result Date: 05/15/2015 CLINICAL DATA:  Right hand  pain. EXAM: RIGHT HAND - COMPLETE 3+ VIEW COMPARISON:  None. FINDINGS: Moderate DIP and PIP joint osteoarthritis with joint space narrowing and early spurring. There is also mild joint space narrowing at the metacarpal phalangeal joints but no obvious erosive findings. Advanced degenerative changes at the carpometacarpal joint of thumb. The intercarpal joint spaces are fairly well maintained in the radiocarpal joint is normal. IMPRESSION: Osteoarthritic type degenerative changes involving the DIP and PIP joints of the fingers and the carpometacarpal joint of the thumb. No definite findings for an erosive arthropathy. Electronically Signed   By: Marijo Sanes M.D.   On: 05/15/2015 08:20    Assessment & Plan:   Paislie was seen today for hypothyroidism and annual exam.  Diagnoses and all orders for this visit:  Osteopenia, senile- her vitamin D level is normal, will continue the current vitamin D supplement -     VITAMIN D 25 Hydroxy (Vit-D Deficiency, Fractures);  Future  Other specified hypothyroidism- her TSH is in the normal range, will continue the current dose of levothyroxine. -     Lipid panel; Future -     Comprehensive metabolic panel; Future -     CBC with Differential/Platelet; Future -     TSH; Future  Routine health maintenance -     Zoster Vac Recomb Adjuvanted [facility]) injection; Inject 0.5 mLs into the muscle once.  Primary insomnia- I've asked her to try Belsomra for this -     Suvorexant (BELSOMRA) 10 MG TABS; Take 1 tablet by mouth at bedtime.   I have discontinued Ms. Wattley's zolpidem. I am also having her start on Suvorexant and Zoster Vac Recomb Adjuvanted. Additionally, I am having her maintain her Azelastine HCl, Biotin, Multiple Vitamins-Minerals (CENTRUM SILVER PO), ranitidine, Cetirizine HCl, BEPREVE, cholecalciferol, levothyroxine, and estradiol.  Meds ordered this encounter  Medications  . Suvorexant (BELSOMRA) 10 MG TABS    Sig: Take 1 tablet by mouth at bedtime.    Dispense:  30 tablet    Refill:  5  . Zoster Vac Recomb Adjuvanted [facility]) injection    Sig: Inject 0.5 mLs into the muscle once.    Dispense:  1 each    Refill:  1   See AVS for instructions about healthy living and anticipatory guidance.   Follow-up: Return in about 6 months (around 10/21/2017).  Scarlette Calico, MD

## 2017-04-21 NOTE — Patient Instructions (Signed)

## 2017-05-12 DIAGNOSIS — J301 Allergic rhinitis due to pollen: Secondary | ICD-10-CM | POA: Diagnosis not present

## 2017-05-12 DIAGNOSIS — J3089 Other allergic rhinitis: Secondary | ICD-10-CM | POA: Diagnosis not present

## 2017-05-22 ENCOUNTER — Telehealth: Payer: Self-pay | Admitting: Internal Medicine

## 2017-05-22 NOTE — Telephone Encounter (Signed)
Pt is currently on Suvorexant ([place]) 10 MG  She states it works ok but it is making her dream a lot.  She states she was told by Ronnald Ramp that if she wanted to go back on Ambien she could. She wants to stop the Belsomra for a few days and try Ambien and see which one she likes the best. Please advise

## 2017-05-23 ENCOUNTER — Other Ambulatory Visit: Payer: Self-pay | Admitting: Internal Medicine

## 2017-05-23 DIAGNOSIS — F5101 Primary insomnia: Secondary | ICD-10-CM

## 2017-05-23 MED ORDER — ZOLPIDEM TARTRATE 5 MG PO TABS: 5.0000 mg | ORAL_TABLET | Freq: Every evening | ORAL | 3 refills | Status: DC | PRN

## 2017-05-23 NOTE — Telephone Encounter (Signed)
RX changed 

## 2017-05-25 NOTE — Telephone Encounter (Signed)
Notified pt w/MD response. Faxed script to [facility]...Johny Chess

## 2017-05-26 DIAGNOSIS — J3089 Other allergic rhinitis: Secondary | ICD-10-CM | POA: Diagnosis not present

## 2017-05-26 DIAGNOSIS — J301 Allergic rhinitis due to pollen: Secondary | ICD-10-CM | POA: Diagnosis not present

## 2017-05-29 ENCOUNTER — Telehealth: Payer: Self-pay | Admitting: Obstetrics and Gynecology

## 2017-05-29 NOTE — Telephone Encounter (Signed)
LMTCB/:NP/ .CX/LETTER SENT/RD

## 2017-06-17 DIAGNOSIS — J3089 Other allergic rhinitis: Secondary | ICD-10-CM | POA: Diagnosis not present

## 2017-06-17 DIAGNOSIS — J301 Allergic rhinitis due to pollen: Secondary | ICD-10-CM | POA: Diagnosis not present

## 2017-07-05 ENCOUNTER — Other Ambulatory Visit: Payer: Self-pay | Admitting: Internal Medicine

## 2017-07-05 DIAGNOSIS — E890 Postprocedural hypothyroidism: Secondary | ICD-10-CM

## 2017-07-13 DIAGNOSIS — L821 Other seborrheic keratosis: Secondary | ICD-10-CM | POA: Diagnosis not present

## 2017-07-13 DIAGNOSIS — J301 Allergic rhinitis due to pollen: Secondary | ICD-10-CM | POA: Diagnosis not present

## 2017-07-13 DIAGNOSIS — L820 Inflamed seborrheic keratosis: Secondary | ICD-10-CM | POA: Diagnosis not present

## 2017-07-13 DIAGNOSIS — J3089 Other allergic rhinitis: Secondary | ICD-10-CM | POA: Diagnosis not present

## 2017-08-04 DIAGNOSIS — J301 Allergic rhinitis due to pollen: Secondary | ICD-10-CM | POA: Diagnosis not present

## 2017-08-04 DIAGNOSIS — J3089 Other allergic rhinitis: Secondary | ICD-10-CM | POA: Diagnosis not present

## 2017-08-06 DIAGNOSIS — J3089 Other allergic rhinitis: Secondary | ICD-10-CM | POA: Diagnosis not present

## 2017-08-17 DIAGNOSIS — J3089 Other allergic rhinitis: Secondary | ICD-10-CM | POA: Diagnosis not present

## 2017-08-17 DIAGNOSIS — J301 Allergic rhinitis due to pollen: Secondary | ICD-10-CM | POA: Diagnosis not present

## 2017-08-19 DIAGNOSIS — J3089 Other allergic rhinitis: Secondary | ICD-10-CM | POA: Diagnosis not present

## 2017-08-24 ENCOUNTER — Encounter: Payer: Self-pay | Admitting: Family Medicine

## 2017-08-24 ENCOUNTER — Ambulatory Visit (INDEPENDENT_AMBULATORY_CARE_PROVIDER_SITE_OTHER): Payer: PPO | Admitting: Family Medicine

## 2017-08-24 VITALS — BP 122/66 | HR 64 | Temp 98.0°F | Ht 60.0 in | Wt 143.0 lb

## 2017-08-24 DIAGNOSIS — M7711 Lateral epicondylitis, right elbow: Secondary | ICD-10-CM | POA: Diagnosis not present

## 2017-08-24 DIAGNOSIS — T148XXA Other injury of unspecified body region, initial encounter: Secondary | ICD-10-CM | POA: Diagnosis not present

## 2017-08-24 NOTE — Patient Instructions (Signed)
Thank you for coming in,   Please apply ice to the area.   You can play golf if there is no pain. If you have significant pain or weakness then you need to let me know.   Please follow up with me if your pain doesn't improve or your outside elbow pain doesn't get any better.    Please feel free to call with any questions or concerns at any time, at [phone]. --Dr. Raeford Razor

## 2017-08-24 NOTE — Progress Notes (Signed)
Maria Montgomery - 71 y.o. female MRN [id number]  Date of birth: Oct 08, 1946  SUBJECTIVE:  Including CC & ROS.  Chief Complaint  Patient presents with  . Bruise    Right lower forearm bruise-does not remember injuring it.Noticed on 08/21/17 a small bruise on her wrist and has since spread to her forearm. She did apply ice and use some Aspirin.    Maria Montgomery is a 71 year old female lives presenting with right anterior forearm bruising and right lateral elbow pain. She reports of bruising started over the weekend. It started and a small spot on the anterior aspect of her wrist and has since spread proximally to the medial epicondyle. She denies any significant pain or any tenderness. She moved some boxes but denies any specific injury. Also golfed quite a bit but denies any injury while playing. Denies any weakness numbness or tingling.  Also having right lateral epicondyle pain. This is been ongoing for some time. Over the course of months. Has not had any treatment. It is tender to touch in this area. There is no radicular symptoms. Has not had any injury in this area. She does golf on a regular basis.     Review of Systems  Constitutional: Negative for fever.  Musculoskeletal: Negative for arthralgias.  Skin: Positive for color change.  Hematological: Negative for adenopathy.    HISTORY: Past Medical, Surgical, Social, and Family History Reviewed & Updated per EMR.   Pertinent Historical Findings include:  Past Medical History:  Diagnosis Date  . Allergic rhinitis   . Blood transfusion    after tonsillectomy as child; and after hysterectomy  . Diverticulosis 2003  . Hypothyroidism 12/2006  . Vitamin D deficiency     Past Surgical History:  Procedure Laterality Date  . ABDOMINAL HYSTERECTOMY    . BLEPHAROPLASTY    . BUNIONECTOMY     hammertoe right '01, hammertoe left '06  . COLONOSCOPY  06/15/2012  . TONSILLECTOMY    . TUBAL LIGATION    . VAGINAL HYSTERECTOMY  1988   had  prolapse and blood transfusion    Allergies  Allergen Reactions  . Morphine And Related Swelling    Family History  Problem Relation Age of Onset  . Lung cancer Father        age 70  . Coronary artery disease Mother   . Breast cancer Mother   . Thyroid disease Mother   . Hypertension Mother   . Osteoporosis Mother   . Diabetes Paternal Uncle   . Asthma Brother   . Epilepsy Brother   . Diabetes Maternal Grandmother   . Multiple births Paternal Grandmother   . Colon cancer Neg Hx   . Stomach cancer Neg Hx      Social History   Social History  . Marital status: Married    Spouse name: N/A  . Number of children: N/A  . Years of education: N/A   Occupational History  . Not on file.   Social History Main Topics  . Smoking status: Never Smoker  . Smokeless tobacco: Never Used  . Alcohol use 4.2 oz/week    7 Glasses of wine per week  . Drug use: No  . Sexual activity: Yes    Birth control/ protection: Post-menopausal   Other Topics Concern  . Not on file   Social History Narrative   College grad- elementary education.  Married '67  Son-'69; Daughter- '71; 5 grandchildren. Very active in the community. Marriage is in good health  PHYSICAL EXAM:  VS: BP 122/66 (BP Location: Left Arm, Patient Position: Sitting)   Pulse 64   Temp 98 F (36.7 C) (Oral)   Ht 5' (1.524 m)   Wt 143 lb (64.9 kg)   LMP 07/28/1987   SpO2 99%   BMI 27.93 kg/m  Physical Exam Gen: NAD, alert, cooperative with exam, well-appearing ENT: normal lips, normal nasal mucosa,  Eye: normal EOM, normal conjunctiva and lids CV:  no edema, +2 pedal pulses   Resp: no accessory muscle use, non-labored,  Skin: no rashes, no areas of induration  Neuro: normal tone, normal sensation to touch Psych:  normal insight, alert and oriented MSK:  Right elbow: Normal elbow range of motion Tenderness to palpation of the lateral condyle. No significant tenderness to middle finger extension to  resistance. Bruising from the volar aspect of the forearm from the wrist towards the medial epicondyles. Some tenderness to palpation over this bruising. Normal strength with wrist flexion to resistance. Normal finger flexion strength to resistance. Normal thumb flexion and extension strength resistance. Normal finger abduction and adduction strength resistance. Neurovascularly intact   Limited ultrasound: Right forearm:  The contents of the carpal tunnel appear to be more mobile. Does not appear to have any muscle tear of the common flexor tendons. Normal appearance of origin at the medial condyle. The spur occurring at the lateral condyle  Summary: Question a carpal tunnel retinacular tear. Spurring at the lateral epicondyle  Ultrasound and interpretation by Maria Coots, MD          ASSESSMENT & PLAN:   Lateral epicondylitis of right elbow Spurring located at the origin of the common extensors. Has localized pain in this area. - Can try nitroglycerin patch as well as the bruising has resolved - Can try injection at some point. - Counseled on home exercise therapy.  Bruising Unclear as the source of her bruising. Denies having specific injury or inciting event but was significantly active over the weekend. It is possible that she has torn a carpal retinaculum as to why the bruise is extending proximally towards the medial condyle. Does not appear to have any weakness or deficit to suggest a muscle tear or an avulsion. No suggestion of a fracture. - Advised ice - Counseled on ongoing treatment plan. - If no improvement or has weakness may need to consider x-ray and MRI

## 2017-08-24 NOTE — Assessment & Plan Note (Signed)
Unclear as the source of her bruising. Denies having specific injury or inciting event but was significantly active over the weekend. It is possible that she has torn a carpal retinaculum as to why the bruise is extending proximally towards the medial condyle. Does not appear to have any weakness or deficit to suggest a muscle tear or an avulsion. No suggestion of a fracture. - Advised ice - Counseled on ongoing treatment plan. - If no improvement or has weakness may need to consider x-ray and MRI

## 2017-08-24 NOTE — Assessment & Plan Note (Signed)
Spurring located at the origin of the common extensors. Has localized pain in this area. - Can try nitroglycerin patch as well as the bruising has resolved - Can try injection at some point. - Counseled on home exercise therapy.

## 2017-08-28 DIAGNOSIS — J301 Allergic rhinitis due to pollen: Secondary | ICD-10-CM | POA: Diagnosis not present

## 2017-08-28 DIAGNOSIS — J3089 Other allergic rhinitis: Secondary | ICD-10-CM | POA: Diagnosis not present

## 2017-09-01 DIAGNOSIS — J3089 Other allergic rhinitis: Secondary | ICD-10-CM | POA: Diagnosis not present

## 2017-09-03 DIAGNOSIS — J3089 Other allergic rhinitis: Secondary | ICD-10-CM | POA: Diagnosis not present

## 2017-09-08 DIAGNOSIS — J3089 Other allergic rhinitis: Secondary | ICD-10-CM | POA: Diagnosis not present

## 2017-09-08 DIAGNOSIS — J301 Allergic rhinitis due to pollen: Secondary | ICD-10-CM | POA: Diagnosis not present

## 2017-09-10 DIAGNOSIS — L309 Dermatitis, unspecified: Secondary | ICD-10-CM | POA: Diagnosis not present

## 2017-09-10 DIAGNOSIS — H1045 Other chronic allergic conjunctivitis: Secondary | ICD-10-CM | POA: Diagnosis not present

## 2017-09-10 DIAGNOSIS — J3089 Other allergic rhinitis: Secondary | ICD-10-CM | POA: Diagnosis not present

## 2017-09-10 DIAGNOSIS — J301 Allergic rhinitis due to pollen: Secondary | ICD-10-CM | POA: Diagnosis not present

## 2017-09-28 DIAGNOSIS — J301 Allergic rhinitis due to pollen: Secondary | ICD-10-CM | POA: Diagnosis not present

## 2017-09-28 DIAGNOSIS — J3089 Other allergic rhinitis: Secondary | ICD-10-CM | POA: Diagnosis not present

## 2017-10-02 ENCOUNTER — Other Ambulatory Visit: Payer: Self-pay | Admitting: Internal Medicine

## 2017-10-02 DIAGNOSIS — E890 Postprocedural hypothyroidism: Secondary | ICD-10-CM

## 2017-10-19 DIAGNOSIS — J3089 Other allergic rhinitis: Secondary | ICD-10-CM | POA: Diagnosis not present

## 2017-10-19 DIAGNOSIS — J301 Allergic rhinitis due to pollen: Secondary | ICD-10-CM | POA: Diagnosis not present

## 2017-10-25 ENCOUNTER — Other Ambulatory Visit: Payer: Self-pay | Admitting: Internal Medicine

## 2017-10-25 DIAGNOSIS — F5101 Primary insomnia: Secondary | ICD-10-CM

## 2017-11-10 DIAGNOSIS — J301 Allergic rhinitis due to pollen: Secondary | ICD-10-CM | POA: Diagnosis not present

## 2017-11-10 DIAGNOSIS — J3089 Other allergic rhinitis: Secondary | ICD-10-CM | POA: Diagnosis not present

## 2017-11-24 DIAGNOSIS — J3089 Other allergic rhinitis: Secondary | ICD-10-CM | POA: Diagnosis not present

## 2017-11-24 DIAGNOSIS — J301 Allergic rhinitis due to pollen: Secondary | ICD-10-CM | POA: Diagnosis not present

## 2017-11-25 DIAGNOSIS — C44729 Squamous cell carcinoma of skin of left lower limb, including hip: Secondary | ICD-10-CM | POA: Diagnosis not present

## 2017-11-25 DIAGNOSIS — D485 Neoplasm of uncertain behavior of skin: Secondary | ICD-10-CM | POA: Diagnosis not present

## 2017-11-25 DIAGNOSIS — L570 Actinic keratosis: Secondary | ICD-10-CM | POA: Diagnosis not present

## 2017-11-25 DIAGNOSIS — L821 Other seborrheic keratosis: Secondary | ICD-10-CM | POA: Diagnosis not present

## 2017-12-02 ENCOUNTER — Ambulatory Visit (INDEPENDENT_AMBULATORY_CARE_PROVIDER_SITE_OTHER)
Admission: RE | Admit: 2017-12-02 | Discharge: 2017-12-02 | Disposition: A | Discharge status: Home / Self Care | Payer: PPO | Source: Ambulatory Visit | Attending: Internal Medicine | Admitting: Internal Medicine

## 2017-12-02 ENCOUNTER — Encounter: Payer: Self-pay | Admitting: Internal Medicine

## 2017-12-02 ENCOUNTER — Ambulatory Visit (INDEPENDENT_AMBULATORY_CARE_PROVIDER_SITE_OTHER): Payer: PPO | Admitting: Internal Medicine

## 2017-12-02 VITALS — BP 138/70 | HR 73 | Temp 98.2°F | Resp 16 | Ht 60.0 in | Wt 143.1 lb

## 2017-12-02 DIAGNOSIS — S8991XA Unspecified injury of right lower leg, initial encounter: Secondary | ICD-10-CM | POA: Diagnosis not present

## 2017-12-02 DIAGNOSIS — M7989 Other specified soft tissue disorders: Secondary | ICD-10-CM

## 2017-12-02 DIAGNOSIS — J301 Allergic rhinitis due to pollen: Secondary | ICD-10-CM | POA: Diagnosis not present

## 2017-12-02 DIAGNOSIS — M79661 Pain in right lower leg: Secondary | ICD-10-CM | POA: Diagnosis not present

## 2017-12-02 DIAGNOSIS — J3089 Other allergic rhinitis: Secondary | ICD-10-CM | POA: Diagnosis not present

## 2017-12-02 NOTE — Patient Instructions (Signed)
Contusion A contusion is a deep bruise. Contusions are the result of a blunt injury to tissues and muscle fibers under the skin. The injury causes bleeding under the skin. The skin overlying the contusion may turn blue, purple, or yellow. Minor injuries will give you a painless contusion, but more severe contusions may stay painful and swollen for a few weeks. What are the causes? This condition is usually caused by a blow, trauma, or direct force to an area of the body. What are the signs or symptoms? Symptoms of this condition include:  Swelling of the injured area.  Pain and tenderness in the injured area.  Discoloration. The area may have redness and then turn blue, purple, or yellow.  How is this diagnosed? This condition is diagnosed based on a physical exam and medical history. An X-ray, CT scan, or MRI may be needed to determine if there are any associated injuries, such as broken bones (fractures). How is this treated? Specific treatment for this condition depends on what area of the body was injured. In general, the best treatment for a contusion is resting, icing, applying pressure to (compression), and elevating the injured area. This is often called the RICE strategy. Over-the-counter anti-inflammatory medicines may also be recommended for pain control. Follow these instructions at home:  Rest the injured area.  If directed, apply ice to the injured area: ? Put ice in a plastic bag. ? Place a towel between your skin and the bag. ? Leave the ice on for 20 minutes, 2-3 times per day.  If directed, apply light compression to the injured area using an elastic bandage. Make sure the bandage is not wrapped too tightly. Remove and reapply the bandage as directed by your health care provider.  If possible, raise (elevate) the injured area above the level of your heart while you are sitting or lying down.  Take over-the-counter and prescription medicines only as told by your health  care provider. Contact a health care provider if:  Your symptoms do not improve after several days of treatment.  Your symptoms get worse.  You have difficulty moving the injured area. Get help right away if:  You have severe pain.  You have numbness in a hand or foot.  Your hand or foot turns pale or cold. This information is not intended to replace advice given to you by your health care provider. Make sure you discuss any questions you have with your health care provider. Document Released: 07/30/2005 Document Revised: 02/28/2016 Document Reviewed: 03/07/2015 Elsevier Interactive Patient Education  2018 [employer]  

## 2017-12-03 ENCOUNTER — Encounter: Payer: Self-pay | Admitting: Internal Medicine

## 2017-12-03 DIAGNOSIS — J301 Allergic rhinitis due to pollen: Secondary | ICD-10-CM | POA: Diagnosis not present

## 2017-12-03 NOTE — Progress Notes (Signed)
Subjective:  Patient ID: BECKEY POLKOWSKI, female    DOB: 1946-06-04  Age: 72 y.o. MRN: [id number]  CC: Leg Injury   HPI KYRSTIN CAMPILLO presents for concerns about persistent pain in her right lower extremity 4 weeks after fall.  She tripped and fell 4 weeks ago sustaining a blunt trauma.  Initially there was some bruising and swelling but that has resolved.  She now complains of a persistent achy sensation but she is able to bear weight on the right lower extremity without difficulty.  Outpatient Medications Prior to Visit  Medication Sig Dispense Refill  . Azelastine HCl (ASTEPRO) 0.15 % SOLN by Nasal route as needed.      Marland Kitchen BEPREVE 1.5 % SOLN Place 1.5 drops into both eyes as needed.    . Biotin 5000 MCG CAPS Take 20,000 mcg by mouth daily.     . Cetirizine HCl (ZYRTEC ALLERGY) 10 MG CAPS Take by mouth as needed. Reported on 11/21/2015    . cholecalciferol (VITAMIN D) 1000 UNITS tablet Take 5,000 Units by mouth daily.     Marland Kitchen estradiol (ESTRACE) 0.1 MG/GM vaginal cream Use 1/2 g vaginally twice weekly 42.5 g 3  . levothyroxine (SYNTHROID, LEVOTHROID) 50 MCG tablet Take 1 tablet (50 mcg total) by mouth daily before breakfast. 90 tablet 2  . ranitidine (ZANTAC) 150 MG tablet Take 150 mg by mouth as needed.      . zolpidem (AMBIEN) 5 MG tablet TAKE 1 TABLET BY MOUTH AT BEDTIME AS NEEDED FOR SLEEP 30 tablet 2  . Multiple Vitamins-Minerals (CENTRUM SILVER PO) Take by mouth daily.       No facility-administered medications prior to visit.     ROS Review of Systems  Constitutional: Negative for chills and fever.  Musculoskeletal: Positive for arthralgias. Negative for back pain, myalgias and neck pain.  Skin: Negative for color change.  Hematological: Negative for adenopathy. Does not bruise/bleed easily.    Objective:  BP 138/70 (BP Location: Left Arm, Patient Position: Sitting, Cuff Size: Normal)   Pulse 73   Temp 98.2 F (36.8 C) (Oral)   Resp 16   Ht 5' (1.524 m)   Wt 143 lb 1.9 oz  (64.9 kg)   LMP 07/28/1987   SpO2 98%   BMI 27.95 kg/m   BP Readings from Last 3 Encounters:  12/02/17 138/70  08/24/17 122/66  04/21/17 114/76    Wt Readings from Last 3 Encounters:  12/02/17 143 lb 1.9 oz (64.9 kg)  08/24/17 143 lb (64.9 kg)  04/21/17 139 lb (63 kg)    Physical Exam  Musculoskeletal:       Right lower leg: Normal. She exhibits no tenderness, no bony tenderness, no swelling, no edema, no deformity and no laceration.       Legs:   Lab Results  Component Value Date   WBC 5.4 04/21/2017   HGB 14.0 04/21/2017   HCT 41.8 04/21/2017   PLT 252.0 04/21/2017   GLUCOSE 96 04/21/2017   CHOL 197 04/21/2017   TRIG 112.0 04/21/2017   HDL 70.60 04/21/2017   LDLDIRECT 97.6 02/05/2009   LDLCALC 104 (H) 04/21/2017   ALT 16 04/21/2017   AST 19 04/21/2017   NA 140 04/21/2017   K 4.2 04/21/2017   CL 103 04/21/2017   CREATININE 0.89 04/21/2017   BUN 20 04/21/2017   CO2 29 04/21/2017   TSH 2.76 04/21/2017    Dg Tibia/fibula Right  Result Date: 12/03/2017 CLINICAL DATA:  Fall. EXAM: RIGHT TIBIA  AND FIBULA - 2 VIEW COMPARISON:  No recent prior. FINDINGS: No acute bony or joint abnormality identified. No evidence of fracture or dislocation. IMPRESSION: No acute abnormality. Electronically Signed   By: Marcello Moores  Register   On: 12/03/2017 07:31    Assessment & Plan:   Haiven was seen today for leg injury.  Diagnoses and all orders for this visit:  Pain and swelling of right lower leg- exam is normal, plain films are unremarkable, symptoms are consistent with contusion/bone bruise.  She will rest, ice and elevate and take over-the-counter anti-inflammatories as needed.  She was advised that if her symptoms do not resolve in the next few weeks then she is to let me know and we may consider doing a more detailed x-ray such as a CT or an MRI. -     DG Tibia/Fibula Right; Future   I have discontinued Stanton Kidney C. Grizzell's Multiple Vitamins-Minerals (CENTRUM SILVER PO). I am also  having her maintain her Azelastine HCl, Biotin, ranitidine, Cetirizine HCl, BEPREVE, cholecalciferol, estradiol, levothyroxine, and zolpidem.  No orders of the defined types were placed in this encounter.    Follow-up: Return if symptoms worsen or fail to improve.  Scarlette Calico, MD

## 2018-01-12 DIAGNOSIS — J3089 Other allergic rhinitis: Secondary | ICD-10-CM | POA: Diagnosis not present

## 2018-01-12 DIAGNOSIS — J301 Allergic rhinitis due to pollen: Secondary | ICD-10-CM | POA: Diagnosis not present

## 2018-01-18 DIAGNOSIS — J3089 Other allergic rhinitis: Secondary | ICD-10-CM | POA: Diagnosis not present

## 2018-01-18 DIAGNOSIS — J301 Allergic rhinitis due to pollen: Secondary | ICD-10-CM | POA: Diagnosis not present

## 2018-01-21 DIAGNOSIS — J3089 Other allergic rhinitis: Secondary | ICD-10-CM | POA: Diagnosis not present

## 2018-01-21 DIAGNOSIS — J301 Allergic rhinitis due to pollen: Secondary | ICD-10-CM | POA: Diagnosis not present

## 2018-01-27 DIAGNOSIS — J301 Allergic rhinitis due to pollen: Secondary | ICD-10-CM | POA: Diagnosis not present

## 2018-01-27 DIAGNOSIS — J3089 Other allergic rhinitis: Secondary | ICD-10-CM | POA: Diagnosis not present

## 2018-02-03 DIAGNOSIS — J3089 Other allergic rhinitis: Secondary | ICD-10-CM | POA: Diagnosis not present

## 2018-02-03 DIAGNOSIS — J301 Allergic rhinitis due to pollen: Secondary | ICD-10-CM | POA: Diagnosis not present

## 2018-02-08 DIAGNOSIS — J3089 Other allergic rhinitis: Secondary | ICD-10-CM | POA: Diagnosis not present

## 2018-02-08 DIAGNOSIS — J301 Allergic rhinitis due to pollen: Secondary | ICD-10-CM | POA: Diagnosis not present

## 2018-02-10 DIAGNOSIS — J301 Allergic rhinitis due to pollen: Secondary | ICD-10-CM | POA: Diagnosis not present

## 2018-02-10 DIAGNOSIS — J3089 Other allergic rhinitis: Secondary | ICD-10-CM | POA: Diagnosis not present

## 2018-02-22 DIAGNOSIS — J301 Allergic rhinitis due to pollen: Secondary | ICD-10-CM | POA: Diagnosis not present

## 2018-02-22 DIAGNOSIS — J3089 Other allergic rhinitis: Secondary | ICD-10-CM | POA: Diagnosis not present

## 2018-02-24 ENCOUNTER — Ambulatory Visit (INDEPENDENT_AMBULATORY_CARE_PROVIDER_SITE_OTHER): Payer: PPO

## 2018-02-24 ENCOUNTER — Ambulatory Visit: Payer: PPO | Admitting: Podiatry

## 2018-02-24 ENCOUNTER — Other Ambulatory Visit: Payer: Self-pay | Admitting: Podiatry

## 2018-02-24 ENCOUNTER — Encounter: Payer: Self-pay | Admitting: Podiatry

## 2018-02-24 ENCOUNTER — Telehealth: Payer: Self-pay | Admitting: Podiatry

## 2018-02-24 DIAGNOSIS — M79671 Pain in right foot: Secondary | ICD-10-CM | POA: Diagnosis not present

## 2018-02-24 DIAGNOSIS — H353131 Nonexudative age-related macular degeneration, bilateral, early dry stage: Secondary | ICD-10-CM | POA: Diagnosis not present

## 2018-02-24 DIAGNOSIS — M2041 Other hammer toe(s) (acquired), right foot: Secondary | ICD-10-CM

## 2018-02-24 DIAGNOSIS — H524 Presbyopia: Secondary | ICD-10-CM | POA: Diagnosis not present

## 2018-02-24 DIAGNOSIS — H5212 Myopia, left eye: Secondary | ICD-10-CM | POA: Diagnosis not present

## 2018-02-24 DIAGNOSIS — M7750 Other enthesopathy of unspecified foot: Secondary | ICD-10-CM

## 2018-02-24 DIAGNOSIS — H52202 Unspecified astigmatism, left eye: Secondary | ICD-10-CM | POA: Diagnosis not present

## 2018-02-24 DIAGNOSIS — M779 Enthesopathy, unspecified: Secondary | ICD-10-CM

## 2018-02-24 DIAGNOSIS — Z961 Presence of intraocular lens: Secondary | ICD-10-CM | POA: Diagnosis not present

## 2018-02-24 MED ORDER — TRIAMCINOLONE ACETONIDE 10 MG/ML IJ SUSP
10.0000 mg | Freq: Once | INTRAMUSCULAR | Status: AC
  Administered 2018-02-24: 10 mg

## 2018-02-24 NOTE — Telephone Encounter (Signed)
I saw Dr. Paulla Dolly this morning and he gave me a cortisone injection and then he put two band-aids on. I forgot to ask when should I take the band-aids off? Also, is there a problem with exercising 2 or 3 days? My number is [phone]. Thank you.

## 2018-02-24 NOTE — Progress Notes (Signed)
Subjective:   Patient ID: Maria Montgomery, female   DOB: 72 y.o.   MRN: [id number]   HPI Patient presents stating she is developed a lot of pain underneath her right foot and she admits she is been quite active and is been going on for the last few months and gradually becoming more aggravating   ROS      Objective:  Physical Exam  Neurovascular status is done to be intact with patient found to have hammertoe deformity second digit right that has gradually gotten worse over time with inflammation pain of the second metatarsal phalangeal joint right     Assessment:  Inflammatory capsulitis second MPJ right with dorsal hammertoe deformity second right     Plan:  Working to focus on the capsule I also discussed hammertoe of the fifth toe may need to be straightened in the metatarsal bones short.  Today I did a proximal nerve block of the right second MPJ aspirated the joint canal small amount of clear fluid and injected with quarter cc dexamethasone Kenalog and advised on padding therapy for this.  Reappoint for [place] to recheck  X-ray indicates that the patient has significant dorsal elevation of the second digit on top of the second metatarsal

## 2018-02-24 NOTE — Telephone Encounter (Signed)
Left message informing pt that she needed to treat herself as an athlete, back off on the strenuousness, length of time of the exercise, gently stretch before and after, and she could take the bandaids off today or tomorrow, they were only applied to keep blood from getting on her clothes.

## 2018-03-10 ENCOUNTER — Ambulatory Visit (INDEPENDENT_AMBULATORY_CARE_PROVIDER_SITE_OTHER): Payer: PPO | Admitting: Podiatry

## 2018-03-10 ENCOUNTER — Encounter: Payer: Self-pay | Admitting: Podiatry

## 2018-03-10 DIAGNOSIS — M779 Enthesopathy, unspecified: Secondary | ICD-10-CM

## 2018-03-10 DIAGNOSIS — J3089 Other allergic rhinitis: Secondary | ICD-10-CM | POA: Diagnosis not present

## 2018-03-10 DIAGNOSIS — M7750 Other enthesopathy of unspecified foot: Secondary | ICD-10-CM

## 2018-03-10 DIAGNOSIS — J301 Allergic rhinitis due to pollen: Secondary | ICD-10-CM | POA: Diagnosis not present

## 2018-03-10 DIAGNOSIS — D3610 Benign neoplasm of peripheral nerves and autonomic nervous system, unspecified: Secondary | ICD-10-CM | POA: Diagnosis not present

## 2018-03-10 DIAGNOSIS — M2041 Other hammer toe(s) (acquired), right foot: Secondary | ICD-10-CM

## 2018-03-10 NOTE — Progress Notes (Signed)
Subjective:   Patient ID: Maria Montgomery, female   DOB: 72 y.o.   MRN: [id number]   HPI Patient presents stating that her pain has reduced but she is concerned about the structure and wants to know whether eventually surgery may be necessary   ROS      Objective:  Physical Exam  Neurovascular status intact with the inflammation around the second MPJ improving with it still being present but nowhere near to the degree with digital deformity probable metatarsal arthritis present     Assessment:  Inflammatory condition with capsulitis hammertoe deformity present right     Plan:  H&P and x-rays reviewed with patient.  At this point I have recommended shoe gear modifications and not going barefoot and we spent a great deal time going over possibility for digital type procedures and other modalities which may be necessary.  Reappoint as needed

## 2018-03-11 ENCOUNTER — Ambulatory Visit: Payer: Self-pay

## 2018-03-11 NOTE — Telephone Encounter (Signed)
Returned call to pt.  Reported she rec'd the 2nd of the Shingrix vaccines on Sunday.  On Monday, reported she felt "flu-like, with fatigue, chills, and achy joints."  Also, reported soreness of arm at the injection site on Monday and Tuesday.  Noted "2 red lines and small red dots" that appeared in the mid upper abdominal region, just below the sternum today.  Reported the red areas are sore to touch, but denied itching. Stated the red areas are slightly raised.  Described the red dots as appearing like they could poss. blister.  Appt. scheduled at PCP office tomorrow, to check for poss. onset of shingles.  Care advice given per protocol.  Verb. understanding; agrees with plan.           Reason for Disposition . [1] Shingles rash (matches SYMPTOMS) AND [2] onset within past 72 hours  Answer Assessment - Initial Assessment Questions 1. APPEARANCE of RASH: "Describe the rash."      Red lines x 2 mid upper abdominal region; some tenderness; then slightly raised red dots x 2 appeared below the red lines 2. LOCATION: "Where is the rash located?"      Mid upper abdomen, just below the breast bone 3. ONSET: "When did the rash start?"      today 4. ITCHING: "Does the rash itch?" If so, ask: "How bad is the itch?"  (Scale 1-10; or mild, moderate, severe)    Denied itching 5. PAIN: "Does the rash hurt?" If so, ask: "How bad is the pain?"  (Scale 1-10; or mild, moderate, severe)     Somewhat tender, but not painful 6. OTHER SYMPTOMS: "Do you have any other symptoms?" (e.g., fever)     On 5/6, c/o flu like sx's with joints achy and chills, and fatigue; injection site was sore on Monday, and Tues.  7. PREGNANCY: "Is there any chance you are pregnant?" "When was your last menstrual period?"     N/a  Protocols used: [facility]  Message from Hewitt Shorts sent at 03/11/2018 1:58 PM EDT   Pt has had her 2nd shingles injectionSunday, the 5th of May, and now has 2 small red lines about an inch long and  are sore to the touch; not itching;very red have come up between her breast, at the top of the rib cage.  Is this the start of shingles or just a mark that has come up?

## 2018-03-12 ENCOUNTER — Encounter: Payer: Self-pay | Admitting: Family

## 2018-03-12 ENCOUNTER — Ambulatory Visit (INDEPENDENT_AMBULATORY_CARE_PROVIDER_SITE_OTHER): Payer: PPO | Admitting: Family

## 2018-03-12 VITALS — BP 128/80 | HR 69 | Temp 98.1°F | Ht 60.0 in | Wt 143.0 lb

## 2018-03-12 DIAGNOSIS — L309 Dermatitis, unspecified: Secondary | ICD-10-CM

## 2018-03-12 MED ORDER — BEPOTASTINE BESILATE 1.5 % OP SOLN: 1.5000 [drp] | OPHTHALMIC | 0 refills | Status: DC | PRN

## 2018-03-12 MED ORDER — MOMETASONE FUROATE 0.1 % EX CREA: 1.0000 "application " | TOPICAL_CREAM | Freq: Two times a day (BID) | CUTANEOUS | 0 refills | Status: DC

## 2018-03-12 NOTE — Progress Notes (Signed)
Maria Montgomery is a 72 y.o. female with the following history as recorded in [employer]:  Patient Active Problem List   Diagnosis Date Noted  . Pain and swelling of right lower leg 12/02/2017  . Bruising 08/24/2017  . Lateral epicondylitis of right elbow 08/24/2017  . Primary insomnia 04/21/2017  . Synovitis of hand 05/14/2015  . Osteopenia, senile 04/17/2015  . Routine health maintenance 03/12/2012  . Hypothyroid 03/10/2012  . Overweight(278.02) 02/09/2009  . ALLERGIC RHINITIS 07/08/2007    Current Outpatient Medications  Medication Sig Dispense Refill  . Azelastine HCl (ASTEPRO) 0.15 % SOLN by Nasal route as needed.      . Bepotastine Besilate (BEPREVE) 1.5 % SOLN Place 2 drops into both eyes as needed. 5 mL 0  . Biotin 5000 MCG CAPS Take 20,000 mcg by mouth daily.     . Cetirizine HCl (ZYRTEC ALLERGY) 10 MG CAPS Take by mouth as needed. Reported on 11/21/2015    . cholecalciferol (VITAMIN D) 1000 UNITS tablet Take 5,000 Units by mouth daily.     Marland Kitchen estradiol (ESTRACE) 0.1 MG/GM vaginal cream Use 1/2 g vaginally twice weekly 42.5 g 3  . levothyroxine (SYNTHROID, LEVOTHROID) 50 MCG tablet Take 1 tablet (50 mcg total) by mouth daily before breakfast. 90 tablet 2  . ranitidine (ZANTAC) 150 MG tablet Take 150 mg by mouth as needed.      . zolpidem (AMBIEN) 5 MG tablet TAKE 1 TABLET BY MOUTH AT BEDTIME AS NEEDED FOR SLEEP 30 tablet 2  . mometasone (ELOCON) 0.1 % cream Apply 1 application topically 2 (two) times daily. 45 g 0   No current facility-administered medications for this visit.     Allergies: Morphine and related  Past Medical History:  Diagnosis Date  . Allergic rhinitis   . Blood transfusion    after tonsillectomy as child; and after hysterectomy  . Diverticulosis 2003  . Hypothyroidism 12/2006  . Vitamin D deficiency     Past Surgical History:  Procedure Laterality Date  . ABDOMINAL HYSTERECTOMY    . BLEPHAROPLASTY    . BUNIONECTOMY     hammertoe right '01, hammertoe  left '06  . COLONOSCOPY  06/15/2012  . TONSILLECTOMY    . TUBAL LIGATION    . VAGINAL HYSTERECTOMY  1988   had prolapse and blood transfusion    Family History  Problem Relation Age of Onset  . Lung cancer Father        age 92  . Coronary artery disease Mother   . Breast cancer Mother   . Thyroid disease Mother   . Hypertension Mother   . Osteoporosis Mother   . Diabetes Paternal Uncle   . Asthma Brother   . Epilepsy Brother   . Diabetes Maternal Grandmother   . Multiple births Paternal Grandmother   . Colon cancer Neg Hx   . Stomach cancer Neg Hx     Social History   Tobacco Use  . Smoking status: Never Smoker  . Smokeless tobacco: Never Used  Substance Use Topics  . Alcohol use: Yes    Alcohol/week: 4.2 oz    Types: 7 Glasses of wine per week    Subjective:  Patient took her 2nd Shingirx vaccine on Sunday morning- by Monday, she notes she just felt "extremely tired"/ body aches and some mild nausea; became concerned about localized reaction to the vaccine when she noticed a "rash" develop in the center of her chest; denies any pain or drainage; notes that area was mildly  itchy; no known exposure to new soaps, foods, detergents or medications; has not been outside working in yard- very allergic to poison ivy;   Also requesting refill on allergy eye drop she has used for past 5+ years;   Objective:  Vitals:   03/12/18 1011  BP: 128/80  Pulse: 69  Temp: 98.1 F (36.7 C)  TempSrc: Oral  SpO2: 98%  Weight: 143 lb (64.9 kg)  Height: 5' (1.524 m)    General: Well developed, well nourished, in no acute distress  Skin : Warm and dry. Linear, erythematous, vesicular rash noted on sternum- rash does cross the mid-line of the body- present under both left and right breast;  Head: Normocephalic and atraumatic  Eyes: Sclera and conjunctiva clear; pupils round and reactive to light; extraocular movements intact  Lungs: Respirations unlabored; clear to auscultation bilaterally  without wheeze, rales, rhonchi  Neurologic: Alert and oriented; speech intact; face symmetrical; moves all extremities well; CNII-XII intact without focal deficit  Assessment:  1. Dermatitis     Plan:  Low suspicion for shingles; area of concern looks more like a contact dermatitis; will treat with Elocon bid; follow-up worse, no better;  Of note, patient has now completed Shingrix series;   No follow-ups on file.  No orders of the defined types were placed in this encounter.   Requested Prescriptions   Signed Prescriptions Disp Refills  . mometasone (ELOCON) 0.1 % cream 45 g 0    Sig: Apply 1 application topically 2 (two) times daily.  . Bepotastine Besilate (BEPREVE) 1.5 % SOLN 5 mL 0    Sig: Place 2 drops into both eyes as needed.

## 2018-03-24 DIAGNOSIS — J301 Allergic rhinitis due to pollen: Secondary | ICD-10-CM | POA: Diagnosis not present

## 2018-03-24 DIAGNOSIS — J3089 Other allergic rhinitis: Secondary | ICD-10-CM | POA: Diagnosis not present

## 2018-04-12 DIAGNOSIS — J3089 Other allergic rhinitis: Secondary | ICD-10-CM | POA: Diagnosis not present

## 2018-04-12 DIAGNOSIS — J301 Allergic rhinitis due to pollen: Secondary | ICD-10-CM | POA: Diagnosis not present

## 2018-04-16 ENCOUNTER — Encounter: Payer: Self-pay | Admitting: Internal Medicine

## 2018-04-16 DIAGNOSIS — Z803 Family history of malignant neoplasm of breast: Secondary | ICD-10-CM | POA: Diagnosis not present

## 2018-04-16 DIAGNOSIS — Z1231 Encounter for screening mammogram for malignant neoplasm of breast: Secondary | ICD-10-CM | POA: Diagnosis not present

## 2018-04-16 LAB — HM MAMMOGRAPHY

## 2018-04-22 ENCOUNTER — Encounter: Payer: Self-pay | Admitting: Internal Medicine

## 2018-04-22 ENCOUNTER — Other Ambulatory Visit: Payer: Self-pay | Admitting: Internal Medicine

## 2018-04-22 ENCOUNTER — Ambulatory Visit (INDEPENDENT_AMBULATORY_CARE_PROVIDER_SITE_OTHER)
Admission: RE | Admit: 2018-04-22 | Discharge: 2018-04-22 | Disposition: A | Discharge status: Home / Self Care | Payer: PPO | Source: Ambulatory Visit | Attending: Internal Medicine | Admitting: Internal Medicine

## 2018-04-22 ENCOUNTER — Ambulatory Visit (INDEPENDENT_AMBULATORY_CARE_PROVIDER_SITE_OTHER): Payer: PPO | Admitting: Internal Medicine

## 2018-04-22 ENCOUNTER — Ambulatory Visit: Payer: PPO | Admitting: Certified Nurse Midwife

## 2018-04-22 ENCOUNTER — Other Ambulatory Visit (INDEPENDENT_AMBULATORY_CARE_PROVIDER_SITE_OTHER): Payer: PPO

## 2018-04-22 VITALS — BP 122/88 | HR 72 | Ht 60.0 in | Wt 139.0 lb

## 2018-04-22 DIAGNOSIS — S6992XA Unspecified injury of left wrist, hand and finger(s), initial encounter: Secondary | ICD-10-CM | POA: Diagnosis not present

## 2018-04-22 DIAGNOSIS — F5101 Primary insomnia: Secondary | ICD-10-CM | POA: Diagnosis not present

## 2018-04-22 DIAGNOSIS — Z0000 Encounter for general adult medical examination without abnormal findings: Secondary | ICD-10-CM | POA: Diagnosis not present

## 2018-04-22 DIAGNOSIS — E785 Hyperlipidemia, unspecified: Secondary | ICD-10-CM | POA: Insufficient documentation

## 2018-04-22 DIAGNOSIS — E039 Hypothyroidism, unspecified: Secondary | ICD-10-CM | POA: Diagnosis not present

## 2018-04-22 DIAGNOSIS — J309 Allergic rhinitis, unspecified: Secondary | ICD-10-CM | POA: Diagnosis not present

## 2018-04-22 DIAGNOSIS — M7989 Other specified soft tissue disorders: Secondary | ICD-10-CM | POA: Diagnosis not present

## 2018-04-22 DIAGNOSIS — S62605A Fracture of unspecified phalanx of left ring finger, initial encounter for closed fracture: Secondary | ICD-10-CM | POA: Insufficient documentation

## 2018-04-22 DIAGNOSIS — H1013 Acute atopic conjunctivitis, bilateral: Secondary | ICD-10-CM | POA: Diagnosis not present

## 2018-04-22 LAB — CBC WITH DIFFERENTIAL/PLATELET
BASOS PCT: 0.8 % (ref 0.0–3.0)
Basophils Absolute: 0 10*3/uL (ref 0.0–0.1)
EOS PCT: 2.7 % (ref 0.0–5.0)
Eosinophils Absolute: 0.1 10*3/uL (ref 0.0–0.7)
HCT: 43.1 % (ref 36.0–46.0)
HEMOGLOBIN: 14.5 g/dL (ref 12.0–15.0)
LYMPHS ABS: 1.7 10*3/uL (ref 0.7–4.0)
Lymphocytes Relative: 30.7 % (ref 12.0–46.0)
MCHC: 33.7 g/dL (ref 30.0–36.0)
MCV: 93.5 fl (ref 78.0–100.0)
MONO ABS: 0.8 10*3/uL (ref 0.1–1.0)
Monocytes Relative: 14.6 % — ABNORMAL HIGH (ref 3.0–12.0)
NEUTROS PCT: 51.2 % (ref 43.0–77.0)
Neutro Abs: 2.8 10*3/uL (ref 1.4–7.7)
Platelets: 267 10*3/uL (ref 150.0–400.0)
RBC: 4.61 Mil/uL (ref 3.87–5.11)
RDW: 13.1 % (ref 11.5–15.5)
WBC: 5.6 10*3/uL (ref 4.0–10.5)

## 2018-04-22 LAB — COMPREHENSIVE METABOLIC PANEL
ALBUMIN: 4.4 g/dL (ref 3.5–5.2)
ALT: 16 U/L (ref 0–35)
AST: 17 U/L (ref 0–37)
Alkaline Phosphatase: 58 U/L (ref 39–117)
BUN: 17 mg/dL (ref 6–23)
CALCIUM: 9.6 mg/dL (ref 8.4–10.5)
CHLORIDE: 103 meq/L (ref 96–112)
CO2: 26 mEq/L (ref 19–32)
Creatinine, Ser: 0.97 mg/dL (ref 0.40–1.20)
GFR: 59.62 mL/min — AB (ref >60.00)
Glucose, Bld: 96 mg/dL (ref 70–99)
POTASSIUM: 4.1 meq/L (ref 3.5–5.1)
SODIUM: 140 meq/L (ref 135–145)
TOTAL PROTEIN: 7.2 g/dL (ref 6.0–8.3)
Total Bilirubin: 0.7 mg/dL (ref 0.2–1.2)

## 2018-04-22 LAB — LIPID PANEL
CHOLESTEROL: 201 mg/dL — AB (ref 0–200)
HDL: 79.6 mg/dL (ref >39.00)
LDL Cholesterol: 98 mg/dL (ref 0–99)
NonHDL: 121.29
Total CHOL/HDL Ratio: 3
Triglycerides: 115 mg/dL (ref 0.0–149.0)
VLDL: 23 mg/dL (ref 0.0–40.0)

## 2018-04-22 LAB — TSH: TSH: 2.3 u[IU]/mL (ref 0.35–4.50)

## 2018-04-22 MED ORDER — AZELASTINE HCL 0.05 % OP SOLN
1.0000 [drp] | Freq: Two times a day (BID) | OPHTHALMIC | 5 refills | Status: AC
Start: 2018-04-22 — End: ?

## 2018-04-22 MED ORDER — ZOLPIDEM TARTRATE 5 MG PO TABS: 5.0000 mg | ORAL_TABLET | Freq: Every evening | ORAL | 5 refills | Status: DC | PRN

## 2018-04-22 NOTE — Progress Notes (Unsigned)
Subjective:  Patient ID: Maria Montgomery, female    DOB: 07/05/1946  Age: 72 y.o. MRN: [id number]  CC: No chief complaint on file.   HPI Maria Montgomery presents for ***  Outpatient Medications Prior to Visit  Medication Sig Dispense Refill  . azelastine (OPTIVAR) 0.05 % ophthalmic solution Place 1 drop into both eyes 2 (two) times daily. 6 mL 5  . Azelastine HCl (ASTEPRO) 0.15 % SOLN by Nasal route as needed.      . Biotin 5000 MCG CAPS Take 20,000 mcg by mouth daily.     . Cetirizine HCl (ZYRTEC ALLERGY) 10 MG CAPS Take by mouth as needed. Reported on 11/21/2015    . cholecalciferol (VITAMIN D) 1000 UNITS tablet Take 5,000 Units by mouth daily.     Marland Kitchen estradiol (ESTRACE) 0.1 MG/GM vaginal cream Use 1/2 g vaginally twice weekly 42.5 g 3  . levothyroxine (SYNTHROID, LEVOTHROID) 50 MCG tablet Take 1 tablet (50 mcg total) by mouth daily before breakfast. 90 tablet 2  . mometasone (ELOCON) 0.1 % cream Apply 1 application topically 2 (two) times daily. 45 g 0  . ranitidine (ZANTAC) 150 MG tablet Take 150 mg by mouth as needed.      . zolpidem (AMBIEN) 5 MG tablet Take 1 tablet (5 mg total) by mouth at bedtime as needed. for sleep 30 tablet 5   No facility-administered medications prior to visit.     ROS Review of Systems  Objective:  LMP 07/28/1987   BP Readings from Last 3 Encounters:  04/22/18 122/88  03/12/18 128/80  12/02/17 138/70    Wt Readings from Last 3 Encounters:  04/22/18 139 lb (63 kg)  03/12/18 143 lb (64.9 kg)  12/02/17 143 lb 1.9 oz (64.9 kg)    Physical Exam  Lab Results  Component Value Date   WBC 5.4 04/21/2017   HGB 14.0 04/21/2017   HCT 41.8 04/21/2017   PLT 252.0 04/21/2017   GLUCOSE 96 04/21/2017   CHOL 197 04/21/2017   TRIG 112.0 04/21/2017   HDL 70.60 04/21/2017   LDLDIRECT 97.6 02/05/2009   LDLCALC 104 (H) 04/21/2017   ALT 16 04/21/2017   AST 19 04/21/2017   NA 140 04/21/2017   K 4.2 04/21/2017   CL 103 04/21/2017   CREATININE 0.89  04/21/2017   BUN 20 04/21/2017   CO2 29 04/21/2017   TSH 2.76 04/21/2017    Dg Finger Ring Left  Result Date: 04/22/2018 CLINICAL DATA:  Injured the fourth finger 3 weeks ago with pain and swelling of the PIP joint EXAM: LEFT RING FINGER 2+V COMPARISON:  Left hand films of 05/14/2015 FINDINGS: There may be a tiny nondisplaced avulsion from the distal aspect of the proximal phalanx of the left fourth digit with adjacent soft tissue swelling. No other acute abnormality is seen. Mild degenerative changes present involving the DIP and PIP joints IMPRESSION: 1. Questionable small avulsion fragment from the distal aspect of the proximal phalanx of the left fourth digit with soft tissue swelling. 2. Degenerative change of the DIP joints. Electronically Signed   By: Ivar Drape M.D.   On: 04/22/2018 08:46    Assessment & Plan:   Diagnoses and all orders for this visit:  Closed displaced fracture of phalanx of left ring finger, unspecified phalanx, initial encounter -     Ambulatory referral to Orthopedic Surgery   I am having Kimia C. Dafoe maintain her Azelastine HCl, Biotin, ranitidine, Cetirizine HCl, cholecalciferol, estradiol, levothyroxine, mometasone, zolpidem, and azelastine.  No orders of the defined types were placed in this encounter.    Follow-up: No follow-ups on file.  Scarlette Calico, MD

## 2018-04-22 NOTE — Progress Notes (Signed)
Subjective:  Patient ID: Maria Montgomery, female    DOB: 03-May-1946  Age: 72 y.o. MRN: [id number]  CC: Hyperlipidemia; Hypothyroidism; and Annual Exam   HPI [name] presents for a CPX.  She came in for physical and routine checkup but she tells me that 3 weeks ago she injured her left ring finger when she jammed it against a hard surface.  She has persistent pain and swelling in the PIP joint.  She has not seen anyone prior to this.  She complains of chronic insomnia and needs a refill on Ambien.  She feels like her thyroid dose is in the normal range as she has had no episodes of weight changes, fatigue, edema, or constipation.  Past Medical History:  Diagnosis Date  . Allergic rhinitis   . Blood transfusion    after tonsillectomy as child; and after hysterectomy  . Diverticulosis 2003  . Hypothyroidism 12/2006  . Vitamin D deficiency    Past Surgical History:  Procedure Laterality Date  . ABDOMINAL HYSTERECTOMY    . BLEPHAROPLASTY    . BUNIONECTOMY     hammertoe right '01, hammertoe left '06  . COLONOSCOPY  06/15/2012  . TONSILLECTOMY    . TUBAL LIGATION    . VAGINAL HYSTERECTOMY  1988   had prolapse and blood transfusion    reports that she has never smoked. She has never used smokeless tobacco. She reports that she drinks about 4.2 oz of alcohol per week. She reports that she does not use drugs. family history includes Asthma in her brother; Breast cancer in her mother; Coronary artery disease in her mother; Diabetes in her maternal grandmother and paternal uncle; Epilepsy in her brother; Hypertension in her mother; Lung cancer in her father; Multiple births in her paternal grandmother; Osteoporosis in her mother; Thyroid disease in her mother. Allergies  Allergen Reactions  . Morphine And Related Swelling    Outpatient Medications Prior to Visit  Medication Sig Dispense Refill  . Azelastine HCl (ASTEPRO) 0.15 % SOLN by Nasal route as needed.      . Biotin 5000 MCG  CAPS Take 20,000 mcg by mouth daily.     . Cetirizine HCl (ZYRTEC ALLERGY) 10 MG CAPS Take by mouth as needed. Reported on 11/21/2015    . cholecalciferol (VITAMIN D) 1000 UNITS tablet Take 5,000 Units by mouth daily.     Marland Kitchen estradiol (ESTRACE) 0.1 MG/GM vaginal cream Use 1/2 g vaginally twice weekly 42.5 g 3  . levothyroxine (SYNTHROID, LEVOTHROID) 50 MCG tablet Take 1 tablet (50 mcg total) by mouth daily before breakfast. 90 tablet 2  . mometasone (ELOCON) 0.1 % cream Apply 1 application topically 2 (two) times daily. 45 g 0  . ranitidine (ZANTAC) 150 MG tablet Take 150 mg by mouth as needed.      Marland Kitchen azelastine (OPTIVAR) 0.05 % ophthalmic solution 1 drop 2 (two) times daily.    Marland Kitchen zolpidem (AMBIEN) 5 MG tablet TAKE 1 TABLET BY MOUTH AT BEDTIME AS NEEDED FOR SLEEP 30 tablet 2  . Bepotastine Besilate (BEPREVE) 1.5 % SOLN Place 2 drops into both eyes as needed. (Patient not taking: Reported on 04/22/2018) 5 mL 0   No facility-administered medications prior to visit.     ROS Review of Systems  Constitutional: Negative.  Negative for appetite change, diaphoresis and fatigue.  HENT: Negative.   Eyes: Positive for itching. Negative for photophobia, pain, discharge, redness and visual disturbance.  Respiratory: Negative for apnea, cough, chest tightness, shortness of  breath and wheezing.   Cardiovascular: Negative for chest pain, palpitations and leg swelling.  Gastrointestinal: Negative for abdominal pain, constipation, diarrhea, nausea and vomiting.  Endocrine: Negative for cold intolerance and heat intolerance.  Genitourinary: Negative.  Negative for difficulty urinating.  Musculoskeletal: Positive for arthralgias. Negative for gait problem and myalgias.  Skin: Negative.  Negative for color change and rash.  Allergic/Immunologic: Negative.   Neurological: Negative.  Negative for dizziness, weakness and light-headedness.  Hematological: Negative for adenopathy. Does not bruise/bleed easily.    Psychiatric/Behavioral: Positive for sleep disturbance. Negative for decreased concentration and dysphoric mood. The patient is not nervous/anxious.     Objective:  BP 122/88 (BP Location: Left Arm, Patient Position: Sitting, Cuff Size: Normal)   Pulse 72   Ht 5' (1.524 m)   Wt 139 lb (63 kg)   LMP 07/28/1987   SpO2 98%   BMI 27.15 kg/m   BP Readings from Last 3 Encounters:  04/22/18 122/88  03/12/18 128/80  12/02/17 138/70    Wt Readings from Last 3 Encounters:  04/22/18 139 lb (63 kg)  03/12/18 143 lb (64.9 kg)  12/02/17 143 lb 1.9 oz (64.9 kg)    Physical Exam  Constitutional: She is oriented to person, place, and time. No distress.  HENT:  Mouth/Throat: Oropharynx is clear and moist. No oropharyngeal exudate.  Eyes: Conjunctivae are normal. No scleral icterus.  Neck: Normal range of motion. Neck supple. No JVD present. No thyromegaly present.  Cardiovascular: Normal rate, regular rhythm and normal heart sounds. Exam reveals no gallop and no friction rub.  No murmur heard. Pulmonary/Chest: Effort normal and breath sounds normal. She has no wheezes. She has no rales.  Abdominal: Soft. Normal appearance and bowel sounds are normal. She exhibits no mass. There is no hepatosplenomegaly. There is no tenderness. No hernia.  Musculoskeletal: Normal range of motion. She exhibits no edema, tenderness or deformity.       Hands: Lymphadenopathy:    She has no cervical adenopathy.  Neurological: She is alert and oriented to person, place, and time.  Skin: Skin is warm and dry. She is not diaphoretic. No pallor.  Psychiatric: She has a normal mood and affect. Her behavior is normal. Judgment and thought content normal.  Vitals reviewed.   Lab Results  Component Value Date   WBC 5.6 04/22/2018   HGB 14.5 04/22/2018   HCT 43.1 04/22/2018   PLT 267.0 04/22/2018   GLUCOSE 96 04/22/2018   CHOL 201 (H) 04/22/2018   TRIG 115.0 04/22/2018   HDL 79.60 04/22/2018   LDLDIRECT 97.6  02/05/2009   LDLCALC 98 04/22/2018   ALT 16 04/22/2018   AST 17 04/22/2018   NA 140 04/22/2018   K 4.1 04/22/2018   CL 103 04/22/2018   CREATININE 0.97 04/22/2018   BUN 17 04/22/2018   CO2 26 04/22/2018   TSH 2.30 04/22/2018    Dg Tibia/fibula Right  Result Date: 12/03/2017 CLINICAL DATA:  Fall. EXAM: RIGHT TIBIA AND FIBULA - 2 VIEW COMPARISON:  No recent prior. FINDINGS: No acute bony or joint abnormality identified. No evidence of fracture or dislocation. IMPRESSION: No acute abnormality. Electronically Signed   By: Marcello Moores  Register   On: 12/03/2017 07:31    Assessment & Plan:   Challis was seen today for hyperlipidemia, hypothyroidism and annual exam.  Diagnoses and all orders for this visit:  Acquired hypothyroidism- Her TSH is in the normal range so she will remain on the current dose of levothyroxine. -  CBC with Differential/Platelet; Future -     TSH; Future  Hyperlipidemia LDL goal <100- Her ASCVD risk score is less than 15% so I do not recommend taking a statin for CV risk reduction. -     Lipid panel; Future -     TSH; Future -     Comprehensive metabolic panel; Future  Routine health maintenance  Injury of left ring finger, initial encounter- She has a small a avulsion fraction in the proximal phalanx.  She has been referred to orthopedics. -     DG Finger Ring Left; Future  Primary insomnia -     zolpidem (AMBIEN) 5 MG tablet; Take 1 tablet (5 mg total) by mouth at bedtime as needed. for sleep  Allergic rhinoconjunctivitis of both eyes -     azelastine (OPTIVAR) 0.05 % ophthalmic solution; Place 1 drop into both eyes 2 (two) times daily.   I have discontinued Joyann C. Tobin's Bepotastine Besilate. I have also changed her zolpidem and azelastine. Additionally, I am having her maintain her Azelastine HCl, Biotin, ranitidine, Cetirizine HCl, cholecalciferol, estradiol, levothyroxine, and mometasone.  Meds ordered this encounter  Medications  . zolpidem  (AMBIEN) 5 MG tablet    Sig: Take 1 tablet (5 mg total) by mouth at bedtime as needed. for sleep    Dispense:  30 tablet    Refill:  5  . azelastine (OPTIVAR) 0.05 % ophthalmic solution    Sig: Place 1 drop into both eyes 2 (two) times daily.    Dispense:  6 mL    Refill:  5   See AVS for instructions about healthy living and anticipatory guidance.  Follow-up: Return in about 6 months (around 10/22/2018).  Scarlette Calico, MD

## 2018-04-22 NOTE — Patient Instructions (Signed)

## 2018-04-24 ENCOUNTER — Encounter: Payer: Self-pay | Admitting: Internal Medicine

## 2018-04-25 NOTE — Assessment & Plan Note (Signed)

## 2018-04-26 ENCOUNTER — Telehealth: Payer: Self-pay | Admitting: Internal Medicine

## 2018-04-26 DIAGNOSIS — S62619A Displaced fracture of proximal phalanx of unspecified finger, initial encounter for closed fracture: Secondary | ICD-10-CM | POA: Insufficient documentation

## 2018-04-26 DIAGNOSIS — S62645A Nondisplaced fracture of proximal phalanx of left ring finger, initial encounter for closed fracture: Secondary | ICD-10-CM | POA: Diagnosis not present

## 2018-04-26 NOTE — Telephone Encounter (Signed)
Copied from [place] [id number]. Topic: Quick Communication - See Telephone Encounter >> Apr 26, 2018 11:34 AM Rutherford Nail, NT wrote: CRM for notification. See Telephone encounter for: 04/26/18. Patient calling and is requesting x-rays of her left hand be sent to Dr Gavin Pound at [facility]. States that she has an appointment today(04/26/18) at 3:30. Please advise.

## 2018-04-28 DIAGNOSIS — J301 Allergic rhinitis due to pollen: Secondary | ICD-10-CM | POA: Diagnosis not present

## 2018-04-28 DIAGNOSIS — J3089 Other allergic rhinitis: Secondary | ICD-10-CM | POA: Diagnosis not present

## 2018-05-03 DIAGNOSIS — J3089 Other allergic rhinitis: Secondary | ICD-10-CM | POA: Diagnosis not present

## 2018-05-14 ENCOUNTER — Other Ambulatory Visit: Payer: Self-pay | Admitting: Internal Medicine

## 2018-05-14 DIAGNOSIS — F5101 Primary insomnia: Secondary | ICD-10-CM

## 2018-05-20 ENCOUNTER — Other Ambulatory Visit: Payer: Self-pay

## 2018-05-20 ENCOUNTER — Ambulatory Visit (INDEPENDENT_AMBULATORY_CARE_PROVIDER_SITE_OTHER): Payer: PPO | Admitting: Certified Nurse Midwife

## 2018-05-20 ENCOUNTER — Encounter: Payer: Self-pay | Admitting: Certified Nurse Midwife

## 2018-05-20 VITALS — BP 110/64 | HR 68 | Resp 16 | Ht 59.25 in | Wt 138.0 lb

## 2018-05-20 DIAGNOSIS — Z1211 Encounter for screening for malignant neoplasm of colon: Secondary | ICD-10-CM

## 2018-05-20 DIAGNOSIS — N951 Menopausal and female climacteric states: Secondary | ICD-10-CM | POA: Diagnosis not present

## 2018-05-20 DIAGNOSIS — N952 Postmenopausal atrophic vaginitis: Secondary | ICD-10-CM | POA: Diagnosis not present

## 2018-05-20 DIAGNOSIS — J3089 Other allergic rhinitis: Secondary | ICD-10-CM | POA: Diagnosis not present

## 2018-05-20 DIAGNOSIS — J301 Allergic rhinitis due to pollen: Secondary | ICD-10-CM | POA: Diagnosis not present

## 2018-05-20 DIAGNOSIS — Z01419 Encounter for gynecological examination (general) (routine) without abnormal findings: Secondary | ICD-10-CM | POA: Diagnosis not present

## 2018-05-20 MED ORDER — ESTRADIOL 0.1 MG/GM VA CREA: TOPICAL_CREAM | VAGINAL | 3 refills | Status: DC

## 2018-05-20 NOTE — Progress Notes (Signed)
72 y.o. G57P2002 Married  Caucasian Fe here for annual exam. Menopausal no HRT. Denies vaginal bleeding or vaginal dryness. Continues to use Estrace cream once weekly and this maintains her. Sees PCP yearly for labs and Hypothyroid medication and Ambien use. All stable. No other health issues today. Spending time at [facility] this summer.  Patient's last menstrual period was 07/28/1987.          Sexually active: Yes.    The current method of family planning is status post hysterectomy.    Exercising: Yes.    swimming, weights & aerobics Smoker:  no  Health Maintenance: Pap:  6//8/15 neg History of Abnormal Pap: no MMG:  04-16-18 category c density birads 2:neg Self Breast exams: occ Colonoscopy:  2013 mild diverticulosis f/u 60yrs BMD:   2016 TDaP:  2011 Shingles: 2019 Pneumonia: 2017 Hep C and HIV: hep c neg 2017 Labs: pcp   reports that she has never smoked. She has never used smokeless tobacco. She reports that she drinks about 4.2 oz of alcohol per week. She reports that she does not use drugs.  Past Medical History:  Diagnosis Date  . Allergic rhinitis   . Blood transfusion    after tonsillectomy as child; and after hysterectomy  . Diverticulosis 2003  . Hypothyroidism 12/2006  . Vitamin D deficiency     Past Surgical History:  Procedure Laterality Date  . ABDOMINAL HYSTERECTOMY    . BLEPHAROPLASTY    . BUNIONECTOMY     hammertoe right '01, hammertoe left '06  . COLONOSCOPY  06/15/2012  . TONSILLECTOMY    . TUBAL LIGATION    . VAGINAL HYSTERECTOMY  1988   had prolapse and blood transfusion    Current Outpatient Medications  Medication Sig Dispense Refill  . azelastine (OPTIVAR) 0.05 % ophthalmic solution Place 1 drop into both eyes 2 (two) times daily. 6 mL 5  . Azelastine HCl (ASTEPRO) 0.15 % SOLN by Nasal route as needed.      Marland Kitchen BIOTIN PO Take 10,000 mg by mouth.    . Cetirizine HCl (ZYRTEC ALLERGY) 10 MG CAPS Take by mouth as needed. Reported on 11/21/2015     . Cholecalciferol (VITAMIN D PO) Take 5,000 Int'l Units by mouth.    . estradiol (ESTRACE) 0.1 MG/GM vaginal cream Use 1/2 g vaginally twice weekly 42.5 g 3  . levothyroxine (SYNTHROID, LEVOTHROID) 50 MCG tablet Take 1 tablet (50 mcg total) by mouth daily before breakfast. 90 tablet 2  . mometasone (ELOCON) 0.1 % cream Apply 1 application topically 2 (two) times daily. 45 g 0  . ranitidine (ZANTAC) 150 MG tablet Take 150 mg by mouth as needed.      . zolpidem (AMBIEN) 5 MG tablet Take 1 tablet (5 mg total) by mouth at bedtime as needed. for sleep 30 tablet 5   No current facility-administered medications for this visit.     Family History  Problem Relation Age of Onset  . Lung cancer Father        age 71  . Coronary artery disease Mother   . Breast cancer Mother   . Thyroid disease Mother   . Hypertension Mother   . Osteoporosis Mother   . Diabetes Paternal Uncle   . Asthma Brother   . Epilepsy Brother   . Diabetes Maternal Grandmother   . Multiple births Paternal Grandmother   . Colon cancer Neg Hx   . Stomach cancer Neg Hx     ROS:  Pertinent items are  noted in HPI.  Otherwise, a comprehensive ROS was negative.  Exam:   BP 110/64   Pulse 68   Resp 16   Ht 4' 11.25" (1.505 m)   Wt 138 lb (62.6 kg)   LMP 07/28/1987   BMI 27.64 kg/m  Height: 4' 11.25" (150.5 cm) Ht Readings from Last 3 Encounters:  05/20/18 4' 11.25" (1.505 m)  04/22/18 5' (1.524 m)  03/12/18 5' (1.524 m)    General appearance: alert, cooperative and appears stated age Head: Normocephalic, without obvious abnormality, atraumatic Neck: no adenopathy, supple, symmetrical, trachea midline and thyroid normal to inspection and palpation Lungs: clear to auscultation bilaterally Breasts: normal appearance, no masses or tenderness, No nipple retraction or dimpling, No nipple discharge or bleeding, No axillary or supraclavicular adenopathy Heart: regular rate and rhythm Abdomen: soft, non-tender; no masses,   no organomegaly Extremities: extremities normal, atraumatic, no cyanosis or edema Skin: Skin color, texture, turgor normal. No rashes or lesions Lymph nodes: Cervical, supraclavicular, and axillary nodes normal. No abnormal inguinal nodes palpated Neurologic: Grossly normal   Pelvic: External genitalia:  no lesions              Urethra:  normal appearing urethra with no masses, tenderness or lesions              Bartholin's and Skene's: normal                 Vagina: normal appearing vagina with normal color and discharge, no lesions              Cervix: absent              Pap taken: No. Bimanual Exam:  Uterus:  uterus absent              Adnexa: normal adnexa and no mass, fullness, tenderness               Rectovaginal: Confirms               Anus:  normal sphincter tone, no lesions  Chaperone present: yes  A:  Well Woman with normal exam  Menopausal no HRT S/P TVH due uterine prolapse.  Atrophic vaginitis with Estrace cream use working well  Hypothyroid, Insomnia with PCP management    P:   Reviewed health and wellness pertinent to exam  Discussed risks/benefits/warning signs with estrace cream use. Desires Rx again  Rx Estrace cream  see order with instructions  Discussed coconut oil for moisture if needed around vaginal opening.  Continue follow up with PCP as indicated  IFOB dispensed with instructions  Pap smear: no   counseled on breast self exam, mammography screening, feminine hygiene, adequate intake of calcium and vitamin D, diet and exercise  return annually or prn  An After Visit Summary was printed and given to the patient.

## 2018-05-20 NOTE — Patient Instructions (Signed)

## 2018-05-24 DIAGNOSIS — Z1211 Encounter for screening for malignant neoplasm of colon: Secondary | ICD-10-CM | POA: Diagnosis not present

## 2018-05-28 LAB — FECAL OCCULT BLOOD, IMMUNOCHEMICAL: Fecal Occult Bld: NEGATIVE

## 2018-06-02 DIAGNOSIS — J3089 Other allergic rhinitis: Secondary | ICD-10-CM | POA: Diagnosis not present

## 2018-06-02 DIAGNOSIS — J301 Allergic rhinitis due to pollen: Secondary | ICD-10-CM | POA: Diagnosis not present

## 2018-06-07 DIAGNOSIS — S62645D Nondisplaced fracture of proximal phalanx of left ring finger, subsequent encounter for fracture with routine healing: Secondary | ICD-10-CM | POA: Diagnosis not present

## 2018-06-07 DIAGNOSIS — S62615D Displaced fracture of proximal phalanx of left ring finger, subsequent encounter for fracture with routine healing: Secondary | ICD-10-CM | POA: Diagnosis not present

## 2018-06-07 DIAGNOSIS — M79645 Pain in left finger(s): Secondary | ICD-10-CM | POA: Diagnosis not present

## 2018-06-14 DIAGNOSIS — J3089 Other allergic rhinitis: Secondary | ICD-10-CM | POA: Diagnosis not present

## 2018-06-14 DIAGNOSIS — J301 Allergic rhinitis due to pollen: Secondary | ICD-10-CM | POA: Diagnosis not present

## 2018-06-17 ENCOUNTER — Other Ambulatory Visit: Payer: Self-pay | Admitting: Internal Medicine

## 2018-06-17 DIAGNOSIS — E890 Postprocedural hypothyroidism: Secondary | ICD-10-CM

## 2018-06-23 DIAGNOSIS — J3089 Other allergic rhinitis: Secondary | ICD-10-CM | POA: Diagnosis not present

## 2018-06-28 DIAGNOSIS — J301 Allergic rhinitis due to pollen: Secondary | ICD-10-CM | POA: Diagnosis not present

## 2018-06-28 DIAGNOSIS — J3089 Other allergic rhinitis: Secondary | ICD-10-CM | POA: Diagnosis not present

## 2018-07-01 DIAGNOSIS — J3089 Other allergic rhinitis: Secondary | ICD-10-CM | POA: Diagnosis not present

## 2018-07-01 DIAGNOSIS — J301 Allergic rhinitis due to pollen: Secondary | ICD-10-CM | POA: Diagnosis not present

## 2018-07-07 DIAGNOSIS — J3089 Other allergic rhinitis: Secondary | ICD-10-CM | POA: Diagnosis not present

## 2018-07-07 DIAGNOSIS — J301 Allergic rhinitis due to pollen: Secondary | ICD-10-CM | POA: Diagnosis not present

## 2018-07-14 DIAGNOSIS — J3089 Other allergic rhinitis: Secondary | ICD-10-CM | POA: Diagnosis not present

## 2018-07-28 DIAGNOSIS — J301 Allergic rhinitis due to pollen: Secondary | ICD-10-CM | POA: Diagnosis not present

## 2018-07-28 DIAGNOSIS — J3089 Other allergic rhinitis: Secondary | ICD-10-CM | POA: Diagnosis not present

## 2018-08-02 DIAGNOSIS — R030 Elevated blood-pressure reading, without diagnosis of hypertension: Secondary | ICD-10-CM | POA: Diagnosis not present

## 2018-08-02 DIAGNOSIS — T7840XS Allergy, unspecified, sequela: Secondary | ICD-10-CM | POA: Diagnosis not present

## 2018-08-02 DIAGNOSIS — R0981 Nasal congestion: Secondary | ICD-10-CM | POA: Diagnosis not present

## 2018-08-02 DIAGNOSIS — H04123 Dry eye syndrome of bilateral lacrimal glands: Secondary | ICD-10-CM | POA: Diagnosis not present

## 2018-08-02 DIAGNOSIS — E039 Hypothyroidism, unspecified: Secondary | ICD-10-CM | POA: Diagnosis not present

## 2018-08-10 DIAGNOSIS — J3089 Other allergic rhinitis: Secondary | ICD-10-CM | POA: Diagnosis not present

## 2018-08-10 DIAGNOSIS — J301 Allergic rhinitis due to pollen: Secondary | ICD-10-CM | POA: Diagnosis not present

## 2018-08-25 DIAGNOSIS — J301 Allergic rhinitis due to pollen: Secondary | ICD-10-CM | POA: Diagnosis not present

## 2018-08-25 DIAGNOSIS — J3089 Other allergic rhinitis: Secondary | ICD-10-CM | POA: Diagnosis not present

## 2018-08-25 DIAGNOSIS — Z23 Encounter for immunization: Secondary | ICD-10-CM | POA: Diagnosis not present

## 2018-09-09 DIAGNOSIS — J3089 Other allergic rhinitis: Secondary | ICD-10-CM | POA: Diagnosis not present

## 2018-09-09 DIAGNOSIS — J301 Allergic rhinitis due to pollen: Secondary | ICD-10-CM | POA: Diagnosis not present

## 2018-09-11 ENCOUNTER — Other Ambulatory Visit: Payer: Self-pay | Admitting: Family

## 2018-09-13 ENCOUNTER — Telehealth: Payer: Self-pay | Admitting: Internal Medicine

## 2018-09-13 DIAGNOSIS — R05 Cough: Secondary | ICD-10-CM | POA: Diagnosis not present

## 2018-09-13 DIAGNOSIS — J301 Allergic rhinitis due to pollen: Secondary | ICD-10-CM | POA: Diagnosis not present

## 2018-09-13 DIAGNOSIS — H1045 Other chronic allergic conjunctivitis: Secondary | ICD-10-CM | POA: Diagnosis not present

## 2018-09-13 DIAGNOSIS — J3089 Other allergic rhinitis: Secondary | ICD-10-CM | POA: Diagnosis not present

## 2018-09-13 NOTE — Telephone Encounter (Signed)
Returned call to pt.  Reported she has noticed thinning of hair over the past year.  Stated it is not coming out in clumps or handfuls; reported she has areas that are thinner than they used to be.  Is asking about cutting back on her Levothyroxine or possibly even stopping this for a trial.  Is requesting to discuss this with Dr. Ronnald Ramp.  Noted the pt. was advised in June to f/u in 6 mos. (10/2018).  Appt. scheduled for 09/16/18 @ 11:00 AM.  Pt. agrees with plan.

## 2018-09-13 NOTE — Telephone Encounter (Signed)
Copied from [place] [id number]. Topic: Quick Communication - See Telephone Encounter >> Sep 13, 2018  1:32 PM Bea Graff, NT wrote: CRM for notification. See Telephone encounter for: 09/13/18. Pt states since taking the levothyroxine she has noticed she is losing her hair. She is wanting to know if she can stop taking the medication everyday or if there is something she can do? Please advise.

## 2018-09-16 ENCOUNTER — Ambulatory Visit (INDEPENDENT_AMBULATORY_CARE_PROVIDER_SITE_OTHER): Payer: PPO | Admitting: Internal Medicine

## 2018-09-16 ENCOUNTER — Encounter: Payer: Self-pay | Admitting: Internal Medicine

## 2018-09-16 ENCOUNTER — Other Ambulatory Visit (INDEPENDENT_AMBULATORY_CARE_PROVIDER_SITE_OTHER): Payer: PPO

## 2018-09-16 VITALS — BP 132/76 | HR 66 | Temp 98.0°F | Resp 16 | Ht 59.25 in | Wt 142.8 lb

## 2018-09-16 DIAGNOSIS — E039 Hypothyroidism, unspecified: Secondary | ICD-10-CM | POA: Diagnosis not present

## 2018-09-16 DIAGNOSIS — L659 Nonscarring hair loss, unspecified: Secondary | ICD-10-CM | POA: Insufficient documentation

## 2018-09-16 LAB — TSH: TSH: 1.65 u[IU]/mL (ref 0.35–4.50)

## 2018-09-16 MED ORDER — FINASTERIDE 1 MG PO TABS
1.0000 mg | ORAL_TABLET | Freq: Every day | ORAL | 1 refills | Status: DC
Start: 2018-09-16 — End: 2019-05-25

## 2018-09-16 NOTE — Patient Instructions (Signed)
Hypothyroidism Hypothyroidism is a disorder of the thyroid. The thyroid is a large gland that is located in the lower front of the neck. The thyroid releases hormones that control how the body works. With hypothyroidism, the thyroid does not make enough of these hormones. What are the causes? Causes of hypothyroidism may include:  Viral infections.  Pregnancy.  Your own defense system (immune system) attacking your thyroid.  Certain medicines.  Birth defects.  Past radiation treatments to your head or neck.  Past treatment with radioactive iodine.  Past surgical removal of part or all of your thyroid.  Problems with the gland that is located in the center of your brain (pituitary).  What are the signs or symptoms? Signs and symptoms of hypothyroidism may include:  Feeling as though you have no energy (lethargy).  Inability to tolerate cold.  Weight gain that is not explained by a change in diet or exercise habits.  Dry skin.  Coarse hair.  Menstrual irregularity.  Slowing of thought processes.  Constipation.  Sadness or depression.  How is this diagnosed? Your health care provider may diagnose hypothyroidism with blood tests and ultrasound tests. How is this treated? Hypothyroidism is treated with medicine that replaces the hormones that your body does not make. After you begin treatment, it may take several weeks for symptoms to go away. Follow these instructions at home:  Take medicines only as directed by your health care provider.  If you start taking any new medicines, tell your health care provider.  Keep all follow-up visits as directed by your health care provider. This is important. As your condition improves, your dosage needs may change. You will need to have blood tests regularly so that your health care provider can watch your condition. Contact a health care provider if:  Your symptoms do not get better with treatment.  You are taking thyroid  replacement medicine and: ? You sweat excessively. ? You have tremors. ? You feel anxious. ? You lose weight rapidly. ? You cannot tolerate heat. ? You have emotional swings. ? You have diarrhea. ? You feel weak. Get help right away if:  You develop chest pain.  You develop an irregular heartbeat.  You develop a rapid heartbeat. This information is not intended to replace advice given to you by your health care provider. Make sure you discuss any questions you have with your health care provider. Document Released: 10/20/2005 Document Revised: 03/27/2016 Document Reviewed: 03/07/2014 Elsevier Interactive Patient Education  2018 [employer]  

## 2018-09-16 NOTE — Progress Notes (Signed)
Subjective:  Patient ID: Maria Montgomery, female    DOB: Apr 09, 1946  Age: 72 y.o. MRN: [id number]  CC: Hypothyroidism   HPI Maria Montgomery presents for f/up - She complains of thinning hair on the top of her scalp and around her eyebrows.  She feels like her thyroid dose is adequate as she has had no recent episodes of fatigue, constipation, frequent bowel movements, edema, or weight changes.  Outpatient Medications Prior to Visit  Medication Sig Dispense Refill  . azelastine (OPTIVAR) 0.05 % ophthalmic solution Place 1 drop into both eyes 2 (two) times daily. 6 mL 5  . Azelastine HCl (ASTEPRO) 0.15 % SOLN by Nasal route as needed.      Marland Kitchen BIOTIN PO Take 10,000 mg by mouth.    . Cetirizine HCl (ZYRTEC ALLERGY) 10 MG CAPS Take by mouth as needed. Reported on 11/21/2015    . Cholecalciferol (VITAMIN D PO) Take 5,000 Int'l Units by mouth.    . estradiol (ESTRACE) 0.1 MG/GM vaginal cream Use 1/2 g vaginally twice weekly 42.5 g 3  . levothyroxine (SYNTHROID, LEVOTHROID) 50 MCG tablet Take 1 tablet (50 mcg total) by mouth daily before breakfast. 90 tablet 1  . mometasone (ELOCON) 0.1 % cream Apply 1 application topically 2 (two) times daily. 45 g 0  . ranitidine (ZANTAC) 150 MG tablet Take 150 mg by mouth as needed.      . zolpidem (AMBIEN) 5 MG tablet Take 1 tablet (5 mg total) by mouth at bedtime as needed. for sleep 30 tablet 5   No facility-administered medications prior to visit.     ROS Review of Systems  Constitutional: Negative for diaphoresis, fatigue and unexpected weight change.  HENT: Negative.   Eyes: Negative for visual disturbance.  Respiratory: Negative for cough, chest tightness, shortness of breath and wheezing.   Cardiovascular: Negative for chest pain, palpitations and leg swelling.  Gastrointestinal: Negative.  Negative for abdominal pain, constipation, diarrhea, nausea and vomiting.  Endocrine: Negative for cold intolerance and heat intolerance.  Genitourinary:  Negative.  Negative for difficulty urinating.  Musculoskeletal: Negative.  Negative for arthralgias and myalgias.  Skin: Negative.  Negative for color change.  Neurological: Negative.  Negative for dizziness, weakness and light-headedness.  Hematological: Negative for adenopathy. Does not bruise/bleed easily.  Psychiatric/Behavioral: Negative.     Objective:  BP 132/76 (BP Location: Left Arm, Patient Position: Sitting, Cuff Size: Normal)   Pulse 66   Temp 98 F (36.7 C) (Oral)   Resp 16   Ht 4' 11.25" (1.505 m)   Wt 142 lb 12 oz (64.8 kg)   LMP 07/28/1987   SpO2 98%   BMI 28.59 kg/m   BP Readings from Last 3 Encounters:  09/16/18 132/76  05/20/18 110/64  04/22/18 122/88    Wt Readings from Last 3 Encounters:  09/16/18 142 lb 12 oz (64.8 kg)  05/20/18 138 lb (62.6 kg)  04/22/18 139 lb (63 kg)    Physical Exam  Constitutional: She is oriented to person, place, and time. No distress.  HENT:  Mouth/Throat: Oropharynx is clear and moist. No oropharyngeal exudate.  Eyes: Conjunctivae are normal. No scleral icterus.  Neck: Normal range of motion. Neck supple. No JVD present. No thyromegaly present.  Cardiovascular: Normal rate, regular rhythm and normal heart sounds. Exam reveals no gallop.  No murmur heard. Pulmonary/Chest: Effort normal and breath sounds normal. No respiratory distress. She has no wheezes. She has no rales.  Abdominal: Soft. Bowel sounds are normal. She exhibits  no mass. There is no hepatosplenomegaly. There is no tenderness.  Musculoskeletal: Normal range of motion. She exhibits no edema or deformity.  Lymphadenopathy:    She has no cervical adenopathy.  Neurological: She is alert and oriented to person, place, and time.  Skin: Skin is warm and dry. She is not diaphoretic. No pallor.  There is female pattern balding on the crown of her scalp.  There is no alopecia.  Vitals reviewed.   Lab Results  Component Value Date   WBC 5.6 04/22/2018   HGB 14.5  04/22/2018   HCT 43.1 04/22/2018   PLT 267.0 04/22/2018   GLUCOSE 96 04/22/2018   CHOL 201 (H) 04/22/2018   TRIG 115.0 04/22/2018   HDL 79.60 04/22/2018   LDLDIRECT 97.6 02/05/2009   LDLCALC 98 04/22/2018   ALT 16 04/22/2018   AST 17 04/22/2018   NA 140 04/22/2018   K 4.1 04/22/2018   CL 103 04/22/2018   CREATININE 0.97 04/22/2018   BUN 17 04/22/2018   CO2 26 04/22/2018   TSH 1.65 09/16/2018    Dg Finger Ring Left  Result Date: 04/22/2018 CLINICAL DATA:  Injured the fourth finger 3 weeks ago with pain and swelling of the PIP joint EXAM: LEFT RING FINGER 2+V COMPARISON:  Left hand films of 05/14/2015 FINDINGS: There may be a tiny nondisplaced avulsion from the distal aspect of the proximal phalanx of the left fourth digit with adjacent soft tissue swelling. No other acute abnormality is seen. Mild degenerative changes present involving the DIP and PIP joints IMPRESSION: 1. Questionable small avulsion fragment from the distal aspect of the proximal phalanx of the left fourth digit with soft tissue swelling. 2. Degenerative change of the DIP joints. Electronically Signed   By: Ivar Drape M.D.   On: 04/22/2018 08:46    Assessment & Plan:   Maria Montgomery was seen today for hypothyroidism.  Diagnoses and all orders for this visit:  Thinning hair- I recommended that she treat this with a 5 alpha reductase inhibitor. -     finasteride (PROPECIA) 1 MG tablet; Take 1 tablet (1 mg total) by mouth daily.  Acquired hypothyroidism- Her TSH is in the normal range.  She will remain on the current dose of levothyroxine. -     TSH; Future   I am having Maria Montgomery start on finasteride. I am also having her maintain her Azelastine HCl, ranitidine, Cetirizine HCl, mometasone, zolpidem, azelastine, BIOTIN PO, Cholecalciferol (VITAMIN D PO), estradiol, and levothyroxine.  Meds ordered this encounter  Medications  . finasteride (PROPECIA) 1 MG tablet    Sig: Take 1 tablet (1 mg total) by mouth daily.      Dispense:  90 tablet    Refill:  1     Follow-up: Return in about 6 months (around 03/17/2019).  Scarlette Calico, MD

## 2018-09-20 DIAGNOSIS — J301 Allergic rhinitis due to pollen: Secondary | ICD-10-CM | POA: Diagnosis not present

## 2018-09-21 DIAGNOSIS — J3089 Other allergic rhinitis: Secondary | ICD-10-CM | POA: Diagnosis not present

## 2018-09-21 DIAGNOSIS — J301 Allergic rhinitis due to pollen: Secondary | ICD-10-CM | POA: Diagnosis not present

## 2018-09-23 ENCOUNTER — Telehealth: Payer: Self-pay

## 2018-09-23 NOTE — Telephone Encounter (Signed)
Key: [id number]

## 2018-09-27 NOTE — Telephone Encounter (Signed)
Pt informed PA was denied and that she can pick up the rx but it would be out of pocket for her to do so. I also stated that shopping around would help her to find the best price.

## 2018-09-27 NOTE — Telephone Encounter (Signed)
PA was denied.   Is there an alternative.  

## 2018-09-27 NOTE — Telephone Encounter (Signed)
No, if she wants to take it then she will need to pay for it  TJ

## 2018-10-04 DIAGNOSIS — J301 Allergic rhinitis due to pollen: Secondary | ICD-10-CM | POA: Diagnosis not present

## 2018-10-04 DIAGNOSIS — J3089 Other allergic rhinitis: Secondary | ICD-10-CM | POA: Diagnosis not present

## 2018-10-18 DIAGNOSIS — J3089 Other allergic rhinitis: Secondary | ICD-10-CM | POA: Diagnosis not present

## 2018-10-18 DIAGNOSIS — J301 Allergic rhinitis due to pollen: Secondary | ICD-10-CM | POA: Diagnosis not present

## 2018-10-20 ENCOUNTER — Ambulatory Visit: Payer: PPO | Admitting: Nurse Practitioner

## 2018-11-01 ENCOUNTER — Other Ambulatory Visit: Payer: Self-pay | Admitting: Internal Medicine

## 2018-11-01 DIAGNOSIS — F5101 Primary insomnia: Secondary | ICD-10-CM

## 2018-11-09 DIAGNOSIS — J3089 Other allergic rhinitis: Secondary | ICD-10-CM | POA: Diagnosis not present

## 2018-11-09 DIAGNOSIS — J301 Allergic rhinitis due to pollen: Secondary | ICD-10-CM | POA: Diagnosis not present

## 2018-11-24 ENCOUNTER — Other Ambulatory Visit: Payer: Self-pay | Admitting: Internal Medicine

## 2018-11-24 DIAGNOSIS — E890 Postprocedural hypothyroidism: Secondary | ICD-10-CM

## 2018-11-25 DIAGNOSIS — Z85828 Personal history of other malignant neoplasm of skin: Secondary | ICD-10-CM | POA: Diagnosis not present

## 2018-11-25 DIAGNOSIS — L821 Other seborrheic keratosis: Secondary | ICD-10-CM | POA: Diagnosis not present

## 2018-11-29 DIAGNOSIS — J3089 Other allergic rhinitis: Secondary | ICD-10-CM | POA: Diagnosis not present

## 2018-11-29 DIAGNOSIS — J301 Allergic rhinitis due to pollen: Secondary | ICD-10-CM | POA: Diagnosis not present

## 2018-12-01 DIAGNOSIS — J301 Allergic rhinitis due to pollen: Secondary | ICD-10-CM | POA: Diagnosis not present

## 2018-12-01 DIAGNOSIS — J3089 Other allergic rhinitis: Secondary | ICD-10-CM | POA: Diagnosis not present

## 2018-12-29 DIAGNOSIS — J301 Allergic rhinitis due to pollen: Secondary | ICD-10-CM | POA: Diagnosis not present

## 2018-12-29 DIAGNOSIS — J3089 Other allergic rhinitis: Secondary | ICD-10-CM | POA: Diagnosis not present

## 2019-01-10 DIAGNOSIS — J301 Allergic rhinitis due to pollen: Secondary | ICD-10-CM | POA: Diagnosis not present

## 2019-01-10 DIAGNOSIS — J3089 Other allergic rhinitis: Secondary | ICD-10-CM | POA: Diagnosis not present

## 2019-01-24 DIAGNOSIS — J301 Allergic rhinitis due to pollen: Secondary | ICD-10-CM | POA: Diagnosis not present

## 2019-01-24 DIAGNOSIS — J3089 Other allergic rhinitis: Secondary | ICD-10-CM | POA: Diagnosis not present

## 2019-01-28 DIAGNOSIS — J301 Allergic rhinitis due to pollen: Secondary | ICD-10-CM | POA: Diagnosis not present

## 2019-01-28 DIAGNOSIS — J3089 Other allergic rhinitis: Secondary | ICD-10-CM | POA: Diagnosis not present

## 2019-03-03 DIAGNOSIS — J301 Allergic rhinitis due to pollen: Secondary | ICD-10-CM | POA: Diagnosis not present

## 2019-03-03 DIAGNOSIS — J3089 Other allergic rhinitis: Secondary | ICD-10-CM | POA: Diagnosis not present

## 2019-03-18 DIAGNOSIS — R52 Pain, unspecified: Secondary | ICD-10-CM | POA: Diagnosis not present

## 2019-03-18 DIAGNOSIS — N390 Urinary tract infection, site not specified: Secondary | ICD-10-CM | POA: Diagnosis not present

## 2019-03-18 DIAGNOSIS — J302 Other seasonal allergic rhinitis: Secondary | ICD-10-CM | POA: Diagnosis not present

## 2019-03-30 DIAGNOSIS — J301 Allergic rhinitis due to pollen: Secondary | ICD-10-CM | POA: Diagnosis not present

## 2019-03-30 DIAGNOSIS — J3089 Other allergic rhinitis: Secondary | ICD-10-CM | POA: Diagnosis not present

## 2019-04-01 DIAGNOSIS — J3089 Other allergic rhinitis: Secondary | ICD-10-CM | POA: Diagnosis not present

## 2019-04-01 DIAGNOSIS — J301 Allergic rhinitis due to pollen: Secondary | ICD-10-CM | POA: Diagnosis not present

## 2019-04-19 ENCOUNTER — Encounter: Payer: Self-pay | Admitting: Internal Medicine

## 2019-04-19 DIAGNOSIS — J301 Allergic rhinitis due to pollen: Secondary | ICD-10-CM | POA: Diagnosis not present

## 2019-04-19 DIAGNOSIS — J3089 Other allergic rhinitis: Secondary | ICD-10-CM | POA: Diagnosis not present

## 2019-04-19 DIAGNOSIS — Z1231 Encounter for screening mammogram for malignant neoplasm of breast: Secondary | ICD-10-CM | POA: Diagnosis not present

## 2019-04-19 DIAGNOSIS — Z803 Family history of malignant neoplasm of breast: Secondary | ICD-10-CM | POA: Diagnosis not present

## 2019-04-19 LAB — HM MAMMOGRAPHY

## 2019-04-22 DIAGNOSIS — H52202 Unspecified astigmatism, left eye: Secondary | ICD-10-CM | POA: Diagnosis not present

## 2019-04-22 DIAGNOSIS — H524 Presbyopia: Secondary | ICD-10-CM | POA: Diagnosis not present

## 2019-04-22 DIAGNOSIS — H353131 Nonexudative age-related macular degeneration, bilateral, early dry stage: Secondary | ICD-10-CM | POA: Diagnosis not present

## 2019-04-22 DIAGNOSIS — H5212 Myopia, left eye: Secondary | ICD-10-CM | POA: Diagnosis not present

## 2019-04-22 DIAGNOSIS — Z961 Presence of intraocular lens: Secondary | ICD-10-CM | POA: Diagnosis not present

## 2019-04-26 DIAGNOSIS — J3089 Other allergic rhinitis: Secondary | ICD-10-CM | POA: Diagnosis not present

## 2019-04-27 DIAGNOSIS — J301 Allergic rhinitis due to pollen: Secondary | ICD-10-CM | POA: Diagnosis not present

## 2019-04-27 DIAGNOSIS — J3089 Other allergic rhinitis: Secondary | ICD-10-CM | POA: Diagnosis not present

## 2019-05-24 ENCOUNTER — Other Ambulatory Visit: Payer: Self-pay

## 2019-05-25 ENCOUNTER — Other Ambulatory Visit: Payer: Self-pay

## 2019-05-25 ENCOUNTER — Encounter: Payer: Self-pay | Admitting: Certified Nurse Midwife

## 2019-05-25 ENCOUNTER — Ambulatory Visit (INDEPENDENT_AMBULATORY_CARE_PROVIDER_SITE_OTHER): Payer: PPO | Admitting: Certified Nurse Midwife

## 2019-05-25 VITALS — BP 112/64 | HR 64 | Temp 97.4°F | Resp 16 | Ht 59.5 in | Wt 138.0 lb

## 2019-05-25 DIAGNOSIS — N952 Postmenopausal atrophic vaginitis: Secondary | ICD-10-CM | POA: Diagnosis not present

## 2019-05-25 DIAGNOSIS — Z01419 Encounter for gynecological examination (general) (routine) without abnormal findings: Secondary | ICD-10-CM | POA: Diagnosis not present

## 2019-05-25 DIAGNOSIS — J3089 Other allergic rhinitis: Secondary | ICD-10-CM | POA: Diagnosis not present

## 2019-05-25 DIAGNOSIS — J301 Allergic rhinitis due to pollen: Secondary | ICD-10-CM | POA: Diagnosis not present

## 2019-05-25 MED ORDER — ESTRADIOL 0.1 MG/GM VA CREA: TOPICAL_CREAM | VAGINAL | 3 refills | Status: DC

## 2019-05-25 NOTE — Progress Notes (Signed)
73 y.o. G52P2002 Married  Caucasian Fe here for annual exam. Post menopausal no vaginal bleeding. Vaginal dryness using Estrace cream without issues. Mammogram done and normal. Sees PCP for Hypothyroid and labs, all normal per patient. Staying active with family and eating well. No issues today.  Patient's last menstrual period was 07/28/1987.          Sexually active: Yes.    The current method of family planning is status post hysterectomy.    Exercising: Yes.    water aerobics, walking Smoker:  no  Review of Systems  Constitutional: Negative.   HENT: Negative.   Eyes: Negative.   Respiratory: Negative.   Cardiovascular: Negative.   Gastrointestinal: Negative.   Genitourinary: Negative.   Musculoskeletal: Negative.   Skin: Negative.   Neurological: Negative.   Endo/Heme/Allergies: Negative.   Psychiatric/Behavioral: Negative.     Health Maintenance: Pap:  04-10-14 neg History of Abnormal Pap: no MMG:  04-19-2019 category c density birads 1:neg Self Breast exams: yes Colonoscopy:  2013 mild diverticulosis f/u 80yrs BMD:   2016 TDaP:  2011 Shingles: 2019 Pneumonia: 2017 Hep C and HIV: hep c neg 2017 Labs: no   reports that she has never smoked. She has never used smokeless tobacco. She reports current alcohol use of about 7.0 standard drinks of alcohol per week. She reports that she does not use drugs.  Past Medical History:  Diagnosis Date  . Allergic rhinitis   . Blood transfusion    after tonsillectomy as child; and after hysterectomy  . Diverticulosis 2003  . Hypothyroidism 12/2006  . Vitamin D deficiency     Past Surgical History:  Procedure Laterality Date  . ABDOMINAL HYSTERECTOMY    . BLEPHAROPLASTY    . BUNIONECTOMY     hammertoe right '01, hammertoe left '06  . COLONOSCOPY  06/15/2012  . TONSILLECTOMY    . TUBAL LIGATION    . VAGINAL HYSTERECTOMY  1988   had prolapse and blood transfusion    Current Outpatient Medications  Medication Sig Dispense  Refill  . azelastine (OPTIVAR) 0.05 % ophthalmic solution Place 1 drop into both eyes 2 (two) times daily. 6 mL 5  . Azelastine HCl (ASTEPRO) 0.15 % SOLN by Nasal route as needed.      Marland Kitchen BIOTIN PO Take 10,000 mg by mouth.    . Cetirizine HCl (ZYRTEC ALLERGY) 10 MG CAPS Take by mouth as needed. Reported on 11/21/2015    . Cholecalciferol (VITAMIN D PO) Take 5,000 Int'l Units by mouth.    . estradiol (ESTRACE) 0.1 MG/GM vaginal cream Use 1/2 g vaginally twice weekly 42.5 g 3  . fluticasone (FLONASE) 50 MCG/ACT nasal spray SHAKE LQ AND U 1 TO 2 SPRAYS IEN QD    . levothyroxine (SYNTHROID, LEVOTHROID) 50 MCG tablet TAKE 1 TABLET(50 MCG) BY MOUTH DAILY BEFORE BREAKFAST 90 tablet 1  . mometasone (ELOCON) 0.1 % cream Apply 1 application topically 2 (two) times daily. 45 g 0  . ranitidine (ZANTAC) 150 MG tablet Take 150 mg by mouth as needed.      . zolpidem (AMBIEN) 5 MG tablet TAKE 1 TABLET(5 MG) BY MOUTH AT BEDTIME AS NEEDED FOR SLEEP 30 tablet 5   No current facility-administered medications for this visit.     Family History  Problem Relation Age of Onset  . Lung cancer Father        age 29  . Coronary artery disease Mother   . Breast cancer Mother   . Thyroid disease Mother   .  Hypertension Mother   . Osteoporosis Mother   . Diabetes Paternal Uncle   . Asthma Brother   . Epilepsy Brother   . Diabetes Maternal Grandmother   . Multiple births Paternal Grandmother   . Colon cancer Neg Hx   . Stomach cancer Neg Hx     ROS:  Pertinent items are noted in HPI.  Otherwise, a comprehensive ROS was negative.  Exam:   BP 112/64   Pulse 64   Temp (!) 97.4 F (36.3 C) (Skin)   Resp 16   Ht 4' 11.5" (1.511 m)   Wt 138 lb (62.6 kg)   LMP 07/28/1987   BMI 27.41 kg/m  Height: 4' 11.5" (151.1 cm) Ht Readings from Last 3 Encounters:  05/25/19 4' 11.5" (1.511 m)  09/16/18 4' 11.25" (1.505 m)  05/20/18 4' 11.25" (1.505 m)    General appearance: alert, cooperative and appears stated  age Head: Normocephalic, without obvious abnormality, atraumatic Neck: no adenopathy, supple, symmetrical, trachea midline and thyroid normal to inspection and palpation Lungs: clear to auscultation bilaterally Breasts: normal appearance, no masses or tenderness, No nipple retraction or dimpling, No nipple discharge or bleeding, No axillary or supraclavicular adenopathy Heart: regular rate and rhythm Abdomen: soft, non-tender; no masses,  no organomegaly Extremities: extremities normal, atraumatic, no cyanosis or edema Skin: Skin color, texture, turgor normal. No rashes or lesions Lymph nodes: Cervical, supraclavicular, and axillary nodes normal. No abnormal inguinal nodes palpated Neurologic: Grossly normal   Pelvic: External genitalia:  no lesions, slight atrophic appearance              Urethra:  normal appearing urethra with no masses, tenderness or lesions              Bartholin's and Skene's: normal                 Vagina: normal appearing vagina with normal color and discharge, no lesions, good moisture noted              Cervix: absent              Pap taken: No. Bimanual Exam:  Uterus:  uterus absent              Adnexa: normal adnexa and no mass, fullness, tenderness               Rectovagyinal: Confirms               Anus:  normal sphincter tone, no lesions  Chaperone present: yes  A:  Well Woman with normal exam  Post menopausal s/p TAH with ovaries retained  Atrophic vaginitis with Estrace cream use with good results, desires continuance  Hypothyroid, insomnia, labs with PCP, all normal per patient    P:   Reviewed health and wellness pertinent to exam  Aware of need to advise if vaginal bleeding while using Estrace cream. Patient aware need for current mammogram yearly to continue. Discussed coconut oil use for moisture at other times and for sexual activity.  Continue follow up with PCP as indicated.  Rx Estrace cream see order with instructions.  Pap smear:  no   counseled on breast self exam, mammography screening, feminine hygiene, adequate intake of calcium and vitamin D, diet and exercise, Kegel's exercises  return annually or prn  An After Visit Summary was printed and given to the patient.

## 2019-05-26 ENCOUNTER — Ambulatory Visit: Payer: PPO | Admitting: Certified Nurse Midwife

## 2019-05-26 ENCOUNTER — Encounter: Payer: Self-pay | Admitting: Internal Medicine

## 2019-05-26 ENCOUNTER — Ambulatory Visit (INDEPENDENT_AMBULATORY_CARE_PROVIDER_SITE_OTHER): Payer: PPO | Admitting: Internal Medicine

## 2019-05-26 ENCOUNTER — Other Ambulatory Visit (INDEPENDENT_AMBULATORY_CARE_PROVIDER_SITE_OTHER): Payer: PPO

## 2019-05-26 ENCOUNTER — Other Ambulatory Visit: Payer: Self-pay

## 2019-05-26 VITALS — BP 120/80 | HR 74 | Temp 98.2°F | Resp 16 | Ht 59.5 in | Wt 138.1 lb

## 2019-05-26 DIAGNOSIS — E039 Hypothyroidism, unspecified: Secondary | ICD-10-CM | POA: Diagnosis not present

## 2019-05-26 DIAGNOSIS — E785 Hyperlipidemia, unspecified: Secondary | ICD-10-CM

## 2019-05-26 DIAGNOSIS — F5101 Primary insomnia: Secondary | ICD-10-CM

## 2019-05-26 DIAGNOSIS — E890 Postprocedural hypothyroidism: Secondary | ICD-10-CM | POA: Diagnosis not present

## 2019-05-26 LAB — LIPID PANEL
Cholesterol: 182 mg/dL (ref 0–200)
HDL: 77.5 mg/dL (ref >39.00)
LDL Cholesterol: 92 mg/dL (ref 0–99)
NonHDL: 104.68
Total CHOL/HDL Ratio: 2
Triglycerides: 65 mg/dL (ref 0.0–149.0)
VLDL: 13 mg/dL (ref 0.0–40.0)

## 2019-05-26 LAB — CBC WITH DIFFERENTIAL/PLATELET
Basophils Absolute: 0.1 10*3/uL (ref 0.0–0.1)
Basophils Relative: 2.3 % (ref 0.0–3.0)
Eosinophils Absolute: 0.2 10*3/uL (ref 0.0–0.7)
Eosinophils Relative: 3.4 % (ref 0.0–5.0)
HCT: 41.5 % (ref 36.0–46.0)
Hemoglobin: 13.8 g/dL (ref 12.0–15.0)
Lymphocytes Relative: 24.2 % (ref 12.0–46.0)
Lymphs Abs: 1.4 10*3/uL (ref 0.7–4.0)
MCHC: 33.1 g/dL (ref 30.0–36.0)
MCV: 94.6 fl (ref 78.0–100.0)
Monocytes Absolute: 0.9 10*3/uL (ref 0.1–1.0)
Monocytes Relative: 15.8 % — ABNORMAL HIGH (ref 3.0–12.0)
Neutro Abs: 3.1 10*3/uL (ref 1.4–7.7)
Neutrophils Relative %: 54.3 % (ref 43.0–77.0)
Platelets: 268 10*3/uL (ref 150.0–400.0)
RBC: 4.39 Mil/uL (ref 3.87–5.11)
RDW: 12.9 % (ref 11.5–15.5)
WBC: 5.8 10*3/uL (ref 4.0–10.5)

## 2019-05-26 LAB — HEPATIC FUNCTION PANEL
ALT: 15 U/L (ref 0–35)
AST: 18 U/L (ref 0–37)
Albumin: 4.4 g/dL (ref 3.5–5.2)
Alkaline Phosphatase: 54 U/L (ref 39–117)
Bilirubin, Direct: 0.3 mg/dL (ref 0.0–0.3)
Total Bilirubin: 0.5 mg/dL (ref 0.2–1.2)
Total Protein: 6.9 g/dL (ref 6.0–8.3)

## 2019-05-26 LAB — BASIC METABOLIC PANEL
BUN: 20 mg/dL (ref 6–23)
CO2: 26 mEq/L (ref 19–32)
Calcium: 9.7 mg/dL (ref 8.4–10.5)
Chloride: 105 mEq/L (ref 96–112)
Creatinine, Ser: 0.89 mg/dL (ref 0.40–1.20)
GFR: 61.77 mL/min (ref >60.00)
Glucose, Bld: 97 mg/dL (ref 70–99)
Potassium: 4.2 mEq/L (ref 3.5–5.1)
Sodium: 140 mEq/L (ref 135–145)

## 2019-05-26 LAB — TSH: TSH: 2.18 u[IU]/mL (ref 0.35–4.50)

## 2019-05-26 MED ORDER — LEVOTHYROXINE SODIUM 50 MCG PO TABS: ORAL_TABLET | ORAL | 1 refills | Status: DC

## 2019-05-26 MED ORDER — ZOLPIDEM TARTRATE 5 MG PO TABS: ORAL_TABLET | ORAL | 5 refills | Status: DC

## 2019-05-26 NOTE — Progress Notes (Signed)
Subjective:  Patient ID: Maria Montgomery, female    DOB: May 31, 1946  Age: 73 y.o. MRN: [id number]  CC: Hypothyroidism and Hyperlipidemia   HPI Maria Montgomery presents for f/up - She continues to complain of insomnia and requests a refill of Ambien.  She has felt well recently and denies fatigue, constipation, weight gain, or edema.  Outpatient Medications Prior to Visit  Medication Sig Dispense Refill  . azelastine (OPTIVAR) 0.05 % ophthalmic solution Place 1 drop into both eyes 2 (two) times daily. 6 mL 5  . Azelastine HCl (ASTEPRO) 0.15 % SOLN by Nasal route as needed.      Marland Kitchen BIOTIN PO Take 10,000 mg by mouth.    . Cetirizine HCl (ZYRTEC ALLERGY) 10 MG CAPS Take by mouth as needed. Reported on 11/21/2015    . Cholecalciferol (VITAMIN D PO) Take 5,000 Int'l Units by mouth.    . estradiol (ESTRACE) 0.1 MG/GM vaginal cream Use 1/2 g vaginally twice weekly 42.5 g 3  . fluticasone (FLONASE) 50 MCG/ACT nasal spray SHAKE LQ AND U 1 TO 2 SPRAYS IEN QD    . mometasone (ELOCON) 0.1 % cream Apply 1 application topically 2 (two) times daily. 45 g 0  . levothyroxine (SYNTHROID, LEVOTHROID) 50 MCG tablet TAKE 1 TABLET(50 MCG) BY MOUTH DAILY BEFORE BREAKFAST 90 tablet 1  . ranitidine (ZANTAC) 150 MG tablet Take 150 mg by mouth as needed.      . zolpidem (AMBIEN) 5 MG tablet TAKE 1 TABLET(5 MG) BY MOUTH AT BEDTIME AS NEEDED FOR SLEEP 30 tablet 5   No facility-administered medications prior to visit.     ROS Review of Systems  Constitutional: Negative.  Negative for diaphoresis, fatigue and unexpected weight change.  HENT: Negative.   Eyes: Negative for visual disturbance.  Respiratory: Negative for cough, chest tightness, shortness of breath and wheezing.   Cardiovascular: Negative for chest pain, palpitations and leg swelling.  Gastrointestinal: Negative for abdominal pain, constipation, diarrhea, nausea and vomiting.  Endocrine: Negative for cold intolerance and heat intolerance.  Genitourinary:  Negative.  Negative for difficulty urinating.  Musculoskeletal: Negative.  Negative for myalgias.  Skin: Negative.  Negative for color change.  Neurological: Negative.  Negative for dizziness, weakness and light-headedness.  Hematological: Negative for adenopathy. Does not bruise/bleed easily.  Psychiatric/Behavioral: Positive for sleep disturbance. Negative for confusion, decreased concentration, dysphoric mood, self-injury and suicidal ideas. The patient is not nervous/anxious.     Objective:  BP 120/80 (BP Location: Left Arm, Patient Position: Sitting, Cuff Size: Normal)   Pulse 74   Temp 98.2 F (36.8 C) (Oral)   Resp 16   Ht 4' 11.5" (1.511 m)   Wt 138 lb 1.3 oz (62.6 kg)   LMP 07/28/1987   SpO2 97%   BMI 27.42 kg/m   BP Readings from Last 3 Encounters:  05/26/19 120/80  05/25/19 112/64  09/16/18 132/76    Wt Readings from Last 3 Encounters:  05/26/19 138 lb 1.3 oz (62.6 kg)  05/25/19 138 lb (62.6 kg)  09/16/18 142 lb 12 oz (64.8 kg)    Physical Exam Vitals signs reviewed.  Constitutional:      Appearance: She is not ill-appearing or diaphoretic.  HENT:     Nose: Nose normal.     Mouth/Throat:     Mouth: Mucous membranes are moist.  Eyes:     General: No scleral icterus.    Conjunctiva/sclera: Conjunctivae normal.  Neck:     Musculoskeletal: Normal range of motion. No neck  rigidity.  Cardiovascular:     Rate and Rhythm: Normal rate and regular rhythm.     Heart sounds: No murmur. No gallop.   Pulmonary:     Effort: Pulmonary effort is normal.     Breath sounds: No stridor. No wheezing, rhonchi or rales.  Abdominal:     General: Abdomen is flat. There is no distension.     Palpations: There is no hepatomegaly, splenomegaly or mass.     Tenderness: There is no abdominal tenderness.  Musculoskeletal: Normal range of motion.     Right lower leg: No edema.     Left lower leg: No edema.  Lymphadenopathy:     Cervical: No cervical adenopathy.  Skin:     General: Skin is warm and dry.     Coloration: Skin is not pale.     Findings: No erythema.  Neurological:     General: No focal deficit present.     Mental Status: She is oriented to person, place, and time. Mental status is at baseline.  Psychiatric:        Mood and Affect: Mood normal.        Behavior: Behavior normal.        Thought Content: Thought content normal.        Judgment: Judgment normal.     Lab Results  Component Value Date   WBC 5.8 05/26/2019   HGB 13.8 05/26/2019   HCT 41.5 05/26/2019   PLT 268.0 05/26/2019   GLUCOSE 97 05/26/2019   CHOL 182 05/26/2019   TRIG 65.0 05/26/2019   HDL 77.50 05/26/2019   LDLDIRECT 97.6 02/05/2009   LDLCALC 92 05/26/2019   ALT 15 05/26/2019   AST 18 05/26/2019   NA 140 05/26/2019   K 4.2 05/26/2019   CL 105 05/26/2019   CREATININE 0.89 05/26/2019   BUN 20 05/26/2019   CO2 26 05/26/2019   TSH 2.18 05/26/2019    Dg Finger Ring Left  Result Date: 04/22/2018 CLINICAL DATA:  Injured the fourth finger 3 weeks ago with pain and swelling of the PIP joint EXAM: LEFT RING FINGER 2+V COMPARISON:  Left hand films of 05/14/2015 FINDINGS: There may be a tiny nondisplaced avulsion from the distal aspect of the proximal phalanx of the left fourth digit with adjacent soft tissue swelling. No other acute abnormality is seen. Mild degenerative changes present involving the DIP and PIP joints IMPRESSION: 1. Questionable small avulsion fragment from the distal aspect of the proximal phalanx of the left fourth digit with soft tissue swelling. 2. Degenerative change of the DIP joints. Electronically Signed   By: Ivar Drape M.D.   On: 04/22/2018 08:46    Assessment & Plan:   Maria Montgomery was seen today for hypothyroidism and hyperlipidemia.  Diagnoses and all orders for this visit:  Acquired hypothyroidism- Her TSH is in the normal range.  She will remain on the current dose of levothyroxine. -     Basic metabolic panel; Future -     TSH; Future -      levothyroxine (SYNTHROID) 50 MCG tablet; TAKE 1 TABLET(50 MCG) BY MOUTH DAILY BEFORE BREAKFAST  Hyperlipidemia LDL goal <100- She has achieved her LDL goal and is doing well on the statin. -     CBC with Differential/Platelet; Future -     Basic metabolic panel; Future -     Lipid panel; Future -     Hepatic function panel; Future  Primary insomnia -     zolpidem (AMBIEN) 5  MG tablet; TAKE 1 TABLET(5 MG) BY MOUTH AT BEDTIME AS NEEDED FOR SLEEP -     Basic metabolic panel; Future  Postoperative hypothyroidism -     levothyroxine (SYNTHROID) 50 MCG tablet; TAKE 1 TABLET(50 MCG) BY MOUTH DAILY BEFORE BREAKFAST   I have discontinued Makeila C. Fuhr "Shanyah Carol"'s ranitidine. I have also changed her levothyroxine. Additionally, I am having her maintain her Azelastine HCl, Cetirizine HCl, mometasone, azelastine, BIOTIN PO, Cholecalciferol (VITAMIN D PO), fluticasone, estradiol, and zolpidem.  Meds ordered this encounter  Medications  . zolpidem (AMBIEN) 5 MG tablet    Sig: TAKE 1 TABLET(5 MG) BY MOUTH AT BEDTIME AS NEEDED FOR SLEEP    Dispense:  30 tablet    Refill:  5  . levothyroxine (SYNTHROID) 50 MCG tablet    Sig: TAKE 1 TABLET(50 MCG) BY MOUTH DAILY BEFORE BREAKFAST    Dispense:  90 tablet    Refill:  1     Follow-up: Return in about 6 months (around 11/26/2019).  Scarlette Calico, MD

## 2019-05-26 NOTE — Patient Instructions (Signed)
Hypothyroidism  Hypothyroidism is when the thyroid gland does not make enough of certain hormones (it is underactive). The thyroid gland is a small gland located in the lower front part of the neck, just in front of the windpipe (trachea). This gland makes hormones that help control how the body uses food for energy (metabolism) as well as how the heart and brain function. These hormones also play a role in keeping your bones strong. When the thyroid is underactive, it produces too little of the hormones thyroxine (T4) and triiodothyronine (T3). What are the causes? This condition may be caused by:  Hashimoto's disease. This is a disease in which the body's disease-fighting system (immune system) attacks the thyroid gland. This is the most common cause.  Viral infections.  Pregnancy.  Certain medicines.  Birth defects.  Past radiation treatments to the head or neck for cancer.  Past treatment with radioactive iodine.  Past exposure to radiation in the environment.  Past surgical removal of part or all of the thyroid.  Problems with a gland in the center of the brain (pituitary gland).  Lack of enough iodine in the diet. What increases the risk? You are more likely to develop this condition if:  You are female.  You have a family history of thyroid conditions.  You use a medicine called lithium.  You take medicines that affect the immune system (immunosuppressants). What are the signs or symptoms? Symptoms of this condition include:  Feeling as though you have no energy (lethargy).  Not being able to tolerate cold.  Weight gain that is not explained by a change in diet or exercise habits.  Lack of appetite.  Dry skin.  Coarse hair.  Menstrual irregularity.  Slowing of thought processes.  Constipation.  Sadness or depression. How is this diagnosed? This condition may be diagnosed based on:  Your symptoms, your medical history, and a physical exam.  Blood  tests. You may also have imaging tests, such as an ultrasound or MRI. How is this treated? This condition is treated with medicine that replaces the thyroid hormones that your body does not make. After you begin treatment, it may take several weeks for symptoms to go away. Follow these instructions at home:  Take over-the-counter and prescription medicines only as told by your health care provider.  If you start taking any new medicines, tell your health care provider.  Keep all follow-up visits as told by your health care provider. This is important. ? As your condition improves, your dosage of thyroid hormone medicine may change. ? You will need to have blood tests regularly so that your health care provider can monitor your condition. Contact a health care provider if:  Your symptoms do not get better with treatment.  You are taking thyroid replacement medicine and you: ? Sweat a lot. ? Have tremors. ? Feel anxious. ? Lose weight rapidly. ? Cannot tolerate heat. ? Have emotional swings. ? Have diarrhea. ? Feel weak. Get help right away if you have:  Chest pain.  An irregular heartbeat.  A rapid heartbeat.  Difficulty breathing. Summary  Hypothyroidism is when the thyroid gland does not make enough of certain hormones (it is underactive).  When the thyroid is underactive, it produces too little of the hormones thyroxine (T4) and triiodothyronine (T3).  The most common cause is Hashimoto's disease, a disease in which the body's disease-fighting system (immune system) attacks the thyroid gland. The condition can also be caused by viral infections, medicine, pregnancy, or past   radiation treatment to the head or neck.  Symptoms may include weight gain, dry skin, constipation, feeling as though you do not have energy, and not being able to tolerate cold.  This condition is treated with medicine to replace the thyroid hormones that your body does not make. This information  is not intended to replace advice given to you by your health care provider. Make sure you discuss any questions you have with your health care provider. Document Released: 10/20/2005 Document Revised: 10/02/2017 Document Reviewed: 09/30/2017 [employer]vier Patient Education  2020 [employer]  

## 2019-06-27 ENCOUNTER — Other Ambulatory Visit: Payer: Self-pay | Admitting: Internal Medicine

## 2019-06-27 DIAGNOSIS — J3089 Other allergic rhinitis: Secondary | ICD-10-CM | POA: Diagnosis not present

## 2019-06-27 DIAGNOSIS — E890 Postprocedural hypothyroidism: Secondary | ICD-10-CM

## 2019-06-27 DIAGNOSIS — E039 Hypothyroidism, unspecified: Secondary | ICD-10-CM

## 2019-06-27 DIAGNOSIS — J301 Allergic rhinitis due to pollen: Secondary | ICD-10-CM | POA: Diagnosis not present

## 2019-06-27 MED ORDER — LEVOTHYROXINE SODIUM 50 MCG PO TABS: ORAL_TABLET | ORAL | 1 refills | Status: DC

## 2019-06-29 DIAGNOSIS — J301 Allergic rhinitis due to pollen: Secondary | ICD-10-CM | POA: Diagnosis not present

## 2019-06-29 DIAGNOSIS — J3089 Other allergic rhinitis: Secondary | ICD-10-CM | POA: Diagnosis not present

## 2019-07-25 DIAGNOSIS — J301 Allergic rhinitis due to pollen: Secondary | ICD-10-CM | POA: Diagnosis not present

## 2019-07-25 DIAGNOSIS — J3089 Other allergic rhinitis: Secondary | ICD-10-CM | POA: Diagnosis not present

## 2019-07-29 DIAGNOSIS — J3089 Other allergic rhinitis: Secondary | ICD-10-CM | POA: Diagnosis not present

## 2019-07-29 DIAGNOSIS — J301 Allergic rhinitis due to pollen: Secondary | ICD-10-CM | POA: Diagnosis not present

## 2019-08-02 DIAGNOSIS — J301 Allergic rhinitis due to pollen: Secondary | ICD-10-CM | POA: Diagnosis not present

## 2019-08-02 DIAGNOSIS — J3089 Other allergic rhinitis: Secondary | ICD-10-CM | POA: Diagnosis not present

## 2019-08-15 DIAGNOSIS — J3089 Other allergic rhinitis: Secondary | ICD-10-CM | POA: Diagnosis not present

## 2019-08-15 DIAGNOSIS — J301 Allergic rhinitis due to pollen: Secondary | ICD-10-CM | POA: Diagnosis not present

## 2019-08-18 DIAGNOSIS — J3089 Other allergic rhinitis: Secondary | ICD-10-CM | POA: Diagnosis not present

## 2019-08-18 DIAGNOSIS — J301 Allergic rhinitis due to pollen: Secondary | ICD-10-CM | POA: Diagnosis not present

## 2019-08-24 DIAGNOSIS — J301 Allergic rhinitis due to pollen: Secondary | ICD-10-CM | POA: Diagnosis not present

## 2019-08-24 DIAGNOSIS — J3089 Other allergic rhinitis: Secondary | ICD-10-CM | POA: Diagnosis not present

## 2019-09-05 DIAGNOSIS — J3089 Other allergic rhinitis: Secondary | ICD-10-CM | POA: Diagnosis not present

## 2019-09-05 DIAGNOSIS — J301 Allergic rhinitis due to pollen: Secondary | ICD-10-CM | POA: Diagnosis not present

## 2019-09-12 DIAGNOSIS — J3089 Other allergic rhinitis: Secondary | ICD-10-CM | POA: Diagnosis not present

## 2019-09-12 DIAGNOSIS — H1045 Other chronic allergic conjunctivitis: Secondary | ICD-10-CM | POA: Diagnosis not present

## 2019-09-12 DIAGNOSIS — J301 Allergic rhinitis due to pollen: Secondary | ICD-10-CM | POA: Diagnosis not present

## 2019-09-12 DIAGNOSIS — R05 Cough: Secondary | ICD-10-CM | POA: Diagnosis not present

## 2019-09-22 DIAGNOSIS — J3089 Other allergic rhinitis: Secondary | ICD-10-CM | POA: Diagnosis not present

## 2019-09-22 DIAGNOSIS — J301 Allergic rhinitis due to pollen: Secondary | ICD-10-CM | POA: Diagnosis not present

## 2019-10-05 DIAGNOSIS — J301 Allergic rhinitis due to pollen: Secondary | ICD-10-CM | POA: Diagnosis not present

## 2019-10-05 DIAGNOSIS — J3089 Other allergic rhinitis: Secondary | ICD-10-CM | POA: Diagnosis not present

## 2019-10-18 DIAGNOSIS — J3089 Other allergic rhinitis: Secondary | ICD-10-CM | POA: Diagnosis not present

## 2019-10-18 DIAGNOSIS — J301 Allergic rhinitis due to pollen: Secondary | ICD-10-CM | POA: Diagnosis not present

## 2019-11-02 ENCOUNTER — Other Ambulatory Visit: Payer: Self-pay

## 2019-11-02 DIAGNOSIS — Z20822 Contact with and (suspected) exposure to covid-19: Secondary | ICD-10-CM

## 2019-11-02 DIAGNOSIS — Z20828 Contact with and (suspected) exposure to other viral communicable diseases: Secondary | ICD-10-CM | POA: Diagnosis not present

## 2019-11-04 LAB — NOVEL CORONAVIRUS, NAA: SARS-CoV-2, NAA: NOT DETECTED

## 2019-11-14 DIAGNOSIS — J301 Allergic rhinitis due to pollen: Secondary | ICD-10-CM | POA: Diagnosis not present

## 2019-11-14 DIAGNOSIS — J3089 Other allergic rhinitis: Secondary | ICD-10-CM | POA: Diagnosis not present

## 2019-11-16 ENCOUNTER — Other Ambulatory Visit: Payer: Self-pay | Admitting: Internal Medicine

## 2019-11-16 DIAGNOSIS — F5101 Primary insomnia: Secondary | ICD-10-CM

## 2019-11-19 ENCOUNTER — Ambulatory Visit: Payer: Medicare Other | Attending: Internal Medicine

## 2019-11-19 DIAGNOSIS — Z23 Encounter for immunization: Secondary | ICD-10-CM | POA: Insufficient documentation

## 2019-11-19 NOTE — Progress Notes (Signed)
   Covid-19 Vaccination Clinic  Name:  Maria Montgomery    MRN: [id number] DOB: 1946/02/27  11/19/2019  [name] was observed post Covid-19 immunization for 15 minutes without incidence. She was provided with Vaccine Information Sheet and instruction to access the V-Safe system.   [name] was instructed to call 911 with any severe reactions post vaccine: Marland Kitchen Difficulty breathing  . Swelling of your face and throat  . A fast heartbeat  . A bad rash all over your body  . Dizziness and weakness    Immunizations Administered    Name Date Dose VIS Date Route   Pfizer COVID-19 Vaccine 11/19/2019 11:44 AM 0.3 mL 10/14/2019 Intramuscular   Manufacturer: [place]   Lot: [id number]   [place]: [id number]

## 2019-11-22 ENCOUNTER — Ambulatory Visit (INDEPENDENT_AMBULATORY_CARE_PROVIDER_SITE_OTHER): Payer: PPO | Admitting: Internal Medicine

## 2019-11-22 ENCOUNTER — Other Ambulatory Visit: Payer: Self-pay

## 2019-11-22 ENCOUNTER — Encounter: Payer: Self-pay | Admitting: Internal Medicine

## 2019-11-22 ENCOUNTER — Ambulatory Visit: Payer: PPO | Admitting: Internal Medicine

## 2019-11-22 ENCOUNTER — Ambulatory Visit: Payer: PPO

## 2019-11-22 VITALS — BP 122/78 | HR 79 | Temp 98.2°F | Ht 59.5 in | Wt 138.0 lb

## 2019-11-22 DIAGNOSIS — M8580 Other specified disorders of bone density and structure, unspecified site: Secondary | ICD-10-CM

## 2019-11-22 DIAGNOSIS — E559 Vitamin D deficiency, unspecified: Secondary | ICD-10-CM | POA: Insufficient documentation

## 2019-11-22 DIAGNOSIS — E039 Hypothyroidism, unspecified: Secondary | ICD-10-CM | POA: Diagnosis not present

## 2019-11-22 DIAGNOSIS — E890 Postprocedural hypothyroidism: Secondary | ICD-10-CM

## 2019-11-22 LAB — VITAMIN D 25 HYDROXY (VIT D DEFICIENCY, FRACTURES): VITD: 48.35 ng/mL (ref 30.00–100.00)

## 2019-11-22 LAB — TSH: TSH: 2.41 u[IU]/mL (ref 0.35–4.50)

## 2019-11-22 MED ORDER — LEVOTHYROXINE SODIUM 50 MCG PO TABS: ORAL_TABLET | ORAL | 1 refills | Status: DC

## 2019-11-22 NOTE — Patient Instructions (Signed)
Hypothyroidism  Hypothyroidism is when the thyroid gland does not make enough of certain hormones (it is underactive). The thyroid gland is a small gland located in the lower front part of the neck, just in front of the windpipe (trachea). This gland makes hormones that help control how the body uses food for energy (metabolism) as well as how the heart and brain function. These hormones also play a role in keeping your bones strong. When the thyroid is underactive, it produces too little of the hormones thyroxine (T4) and triiodothyronine (T3). What are the causes? This condition may be caused by:  Hashimoto's disease. This is a disease in which the body's disease-fighting system (immune system) attacks the thyroid gland. This is the most common cause.  Viral infections.  Pregnancy.  Certain medicines.  Birth defects.  Past radiation treatments to the head or neck for cancer.  Past treatment with radioactive iodine.  Past exposure to radiation in the environment.  Past surgical removal of part or all of the thyroid.  Problems with a gland in the center of the brain (pituitary gland).  Lack of enough iodine in the diet. What increases the risk? You are more likely to develop this condition if:  You are female.  You have a family history of thyroid conditions.  You use a medicine called lithium.  You take medicines that affect the immune system (immunosuppressants). What are the signs or symptoms? Symptoms of this condition include:  Feeling as though you have no energy (lethargy).  Not being able to tolerate cold.  Weight gain that is not explained by a change in diet or exercise habits.  Lack of appetite.  Dry skin.  Coarse hair.  Menstrual irregularity.  Slowing of thought processes.  Constipation.  Sadness or depression. How is this diagnosed? This condition may be diagnosed based on:  Your symptoms, your medical history, and a physical exam.  Blood  tests. You may also have imaging tests, such as an ultrasound or MRI. How is this treated? This condition is treated with medicine that replaces the thyroid hormones that your body does not make. After you begin treatment, it may take several weeks for symptoms to go away. Follow these instructions at home:  Take over-the-counter and prescription medicines only as told by your health care provider.  If you start taking any new medicines, tell your health care provider.  Keep all follow-up visits as told by your health care provider. This is important. ? As your condition improves, your dosage of thyroid hormone medicine may change. ? You will need to have blood tests regularly so that your health care provider can monitor your condition. Contact a health care provider if:  Your symptoms do not get better with treatment.  You are taking thyroid replacement medicine and you: ? Sweat a lot. ? Have tremors. ? Feel anxious. ? Lose weight rapidly. ? Cannot tolerate heat. ? Have emotional swings. ? Have diarrhea. ? Feel weak. Get help right away if you have:  Chest pain.  An irregular heartbeat.  A rapid heartbeat.  Difficulty breathing. Summary  Hypothyroidism is when the thyroid gland does not make enough of certain hormones (it is underactive).  When the thyroid is underactive, it produces too little of the hormones thyroxine (T4) and triiodothyronine (T3).  The most common cause is Hashimoto's disease, a disease in which the body's disease-fighting system (immune system) attacks the thyroid gland. The condition can also be caused by viral infections, medicine, pregnancy, or past   radiation treatment to the head or neck.  Symptoms may include weight gain, dry skin, constipation, feeling as though you do not have energy, and not being able to tolerate cold.  This condition is treated with medicine to replace the thyroid hormones that your body does not make. This information  is not intended to replace advice given to you by your health care provider. Make sure you discuss any questions you have with your health care provider. Document Revised: 10/02/2017 Document Reviewed: 09/30/2017 Elsevier Patient Education  2020 Elsevier Inc.  

## 2019-11-22 NOTE — Progress Notes (Signed)
Subjective:  Patient ID: Maria Montgomery, female    DOB: [birth year]  Age: 74 y.o. MRN: [id number]  CC: Hypothyroidism   This visit occurred during the SARS-CoV-2 public health emergency.  Safety protocols were in place, including screening questions prior to the visit, additional usage of staff PPE, and extensive cleaning of exam room while observing appropriate contact time as indicated for disinfecting solutions.    HPI Maria Montgomery presents for f/up - She complains of chronic intermittent insomnia with frequent awakenings.  She is getting adequate symptom relief with Ambien.  She feels like her current thyroid dose is adequate..  Outpatient Medications Prior to Visit  Medication Sig Dispense Refill  . azelastine (OPTIVAR) 0.05 % ophthalmic solution Place 1 drop into both eyes 2 (two) times daily. 6 mL 5  . Azelastine HCl (ASTEPRO) 0.15 % SOLN by Nasal route as needed.      Marland Kitchen BIOTIN PO Take 10,000 mg by mouth.    . Cetirizine HCl (ZYRTEC ALLERGY) 10 MG CAPS Take by mouth as needed. Reported on 11/21/2015    . Cholecalciferol (VITAMIN D PO) Take 5,000 Int'l Units by mouth.    . estradiol (ESTRACE) 0.1 MG/GM vaginal cream Use 1/2 g vaginally twice weekly 42.5 g 3  . fluticasone (FLONASE) 50 MCG/ACT nasal spray SHAKE LQ AND U 1 TO 2 SPRAYS IEN QD    . mometasone (ELOCON) 0.1 % cream Apply 1 application topically 2 (two) times daily. 45 g 0  . zolpidem (AMBIEN) 5 MG tablet TAKE 1 TABLET(5 MG) BY MOUTH AT BEDTIME AS NEEDED FOR SLEEP 30 tablet 3  . levothyroxine (SYNTHROID) 50 MCG tablet TAKE 1 TABLET(50 MCG) BY MOUTH DAILY BEFORE BREAKFAST 90 tablet 1   No facility-administered medications prior to visit.    ROS Review of Systems  Constitutional: Negative for diaphoresis, fatigue and unexpected weight change.  HENT: Negative.   Eyes: Negative.   Respiratory: Negative for cough, chest tightness, shortness of breath and wheezing.   Cardiovascular: Negative for chest pain, palpitations  and leg swelling.  Gastrointestinal: Negative for abdominal pain, constipation, diarrhea, nausea and vomiting.  Endocrine: Negative for cold intolerance and heat intolerance.  Genitourinary: Negative.  Negative for difficulty urinating.  Musculoskeletal: Negative for myalgias.  Skin: Negative.  Negative for color change and pallor.  Neurological: Negative.  Negative for dizziness, weakness and light-headedness.  Hematological: Negative for adenopathy. Does not bruise/bleed easily.  Psychiatric/Behavioral: Positive for sleep disturbance. Negative for decreased concentration, dysphoric mood and suicidal ideas.    Objective:  BP 122/78 (BP Location: Left Arm, Patient Position: Sitting, Cuff Size: Normal)   Pulse 79   Temp 98.2 F (36.8 C) (Oral)   Ht 4' 11.5" (1.511 m)   Wt 138 lb (62.6 kg)   LMP 07/28/1987   SpO2 99%   BMI 27.41 kg/m   BP Readings from Last 3 Encounters:  11/22/19 122/78  05/26/19 120/80  05/25/19 112/64    Wt Readings from Last 3 Encounters:  11/22/19 138 lb (62.6 kg)  05/26/19 138 lb 1.3 oz (62.6 kg)  05/25/19 138 lb (62.6 kg)    Physical Exam Vitals reviewed.  Constitutional:      Appearance: Normal appearance.  HENT:     Nose: Nose normal.     Mouth/Throat:     Mouth: Mucous membranes are moist.  Eyes:     General: No scleral icterus.    Conjunctiva/sclera: Conjunctivae normal.  Cardiovascular:     Rate and Rhythm: Normal rate and  regular rhythm.     Heart sounds: No murmur.  Pulmonary:     Effort: Pulmonary effort is normal.     Breath sounds: No stridor. No wheezing, rhonchi or rales.  Abdominal:     General: Abdomen is flat. Bowel sounds are normal. There is no distension.     Palpations: Abdomen is soft. There is no hepatomegaly or splenomegaly.     Tenderness: There is no abdominal tenderness.  Musculoskeletal:     Cervical back: Neck supple.  Lymphadenopathy:     Cervical: No cervical adenopathy.  Skin:    General: Skin is warm.   Neurological:     General: No focal deficit present.     Mental Status: She is alert and oriented to person, place, and time.  Psychiatric:        Mood and Affect: Mood normal.        Behavior: Behavior normal.     Lab Results  Component Value Date   WBC 5.8 05/26/2019   HGB 13.8 05/26/2019   HCT 41.5 05/26/2019   PLT 268.0 05/26/2019   GLUCOSE 97 05/26/2019   CHOL 182 05/26/2019   TRIG 65.0 05/26/2019   HDL 77.50 05/26/2019   LDLDIRECT 97.6 02/05/2009   LDLCALC 92 05/26/2019   ALT 15 05/26/2019   AST 18 05/26/2019   NA 140 05/26/2019   K 4.2 05/26/2019   CL 105 05/26/2019   CREATININE 0.89 05/26/2019   BUN 20 05/26/2019   CO2 26 05/26/2019   TSH 2.41 11/22/2019    DG Finger Ring Left  Result Date: 04/22/2018 CLINICAL DATA:  Injured the fourth finger 3 weeks ago with pain and swelling of the PIP joint EXAM: LEFT RING FINGER 2+V COMPARISON:  Left hand films of 05/14/2015 FINDINGS: There may be a tiny nondisplaced avulsion from the distal aspect of the proximal phalanx of the left fourth digit with adjacent soft tissue swelling. No other acute abnormality is seen. Mild degenerative changes present involving the DIP and PIP joints IMPRESSION: 1. Questionable small avulsion fragment from the distal aspect of the proximal phalanx of the left fourth digit with soft tissue swelling. 2. Degenerative change of the DIP joints. Electronically Signed   By: Ivar Drape M.D.   On: 04/22/2018 08:46    Assessment & Plan:   Maria Montgomery was seen today for hypothyroidism.  Diagnoses and all orders for this visit:  Acquired hypothyroidism- Her TSH is in the normal range.  She will remain on the current dose of levothyroxine. -     TSH -     levothyroxine (SYNTHROID) 50 MCG tablet; TAKE 1 TABLET(50 MCG) BY MOUTH DAILY BEFORE BREAKFAST  Vitamin D deficiency- Her vitamin D level is in the normal range.  I recommend she continue taking the current dose of the vitamin D supplement. -     VITAMIN D  25 Hydroxy (Vit-D Deficiency, Fractures)  Osteopenia, senile -     VITAMIN D 25 Hydroxy (Vit-D Deficiency, Fractures)  Postoperative hypothyroidism -     levothyroxine (SYNTHROID) 50 MCG tablet; TAKE 1 TABLET(50 MCG) BY MOUTH DAILY BEFORE BREAKFAST   I am having Maria Montgomery "Ellsworth Lennox" maintain her Azelastine HCl, Cetirizine HCl, mometasone, azelastine, BIOTIN PO, Cholecalciferol (VITAMIN D PO), fluticasone, estradiol, zolpidem, and levothyroxine.  Meds ordered this encounter  Medications  . levothyroxine (SYNTHROID) 50 MCG tablet    Sig: TAKE 1 TABLET(50 MCG) BY MOUTH DAILY BEFORE BREAKFAST    Dispense:  90 tablet    Refill:  1     Follow-up: Return in about 6 months (around 05/21/2020).  Scarlette Calico, MD

## 2019-11-25 DIAGNOSIS — J301 Allergic rhinitis due to pollen: Secondary | ICD-10-CM | POA: Diagnosis not present

## 2019-11-25 DIAGNOSIS — J3089 Other allergic rhinitis: Secondary | ICD-10-CM | POA: Diagnosis not present

## 2019-11-29 DIAGNOSIS — D692 Other nonthrombocytopenic purpura: Secondary | ICD-10-CM | POA: Diagnosis not present

## 2019-11-29 DIAGNOSIS — D485 Neoplasm of uncertain behavior of skin: Secondary | ICD-10-CM | POA: Diagnosis not present

## 2019-11-29 DIAGNOSIS — L430 Hypertrophic lichen planus: Secondary | ICD-10-CM | POA: Diagnosis not present

## 2019-11-29 DIAGNOSIS — L309 Dermatitis, unspecified: Secondary | ICD-10-CM | POA: Diagnosis not present

## 2019-11-29 DIAGNOSIS — L821 Other seborrheic keratosis: Secondary | ICD-10-CM | POA: Diagnosis not present

## 2019-11-29 DIAGNOSIS — Z85828 Personal history of other malignant neoplasm of skin: Secondary | ICD-10-CM | POA: Diagnosis not present

## 2019-12-06 DIAGNOSIS — J301 Allergic rhinitis due to pollen: Secondary | ICD-10-CM | POA: Diagnosis not present

## 2019-12-06 DIAGNOSIS — J3089 Other allergic rhinitis: Secondary | ICD-10-CM | POA: Diagnosis not present

## 2019-12-07 ENCOUNTER — Ambulatory Visit: Payer: PPO | Attending: Internal Medicine

## 2019-12-07 DIAGNOSIS — Z23 Encounter for immunization: Secondary | ICD-10-CM

## 2019-12-07 NOTE — Progress Notes (Signed)
   Covid-19 Vaccination Clinic  Name:  Maria Montgomery    MRN: [id number] DOB: 19-Mar-1946  12/07/2019  [name] was observed post Covid-19 immunization for 15 minutes without incidence. She was provided with Vaccine Information Sheet and instruction to access the V-Safe system.   [name] was instructed to call 911 with any severe reactions post vaccine: Marland Kitchen Difficulty breathing  . Swelling of your face and throat  . A fast heartbeat  . A bad rash all over your body  . Dizziness and weakness    Immunizations Administered    Name Date Dose VIS Date Route   Pfizer COVID-19 Vaccine 12/07/2019  8:23 AM 0.3 mL 10/14/2019 Intramuscular   Manufacturer: [place]   Lot: [id number]   [place]: [id number]

## 2020-01-03 DIAGNOSIS — J301 Allergic rhinitis due to pollen: Secondary | ICD-10-CM | POA: Diagnosis not present

## 2020-01-03 DIAGNOSIS — J3089 Other allergic rhinitis: Secondary | ICD-10-CM | POA: Diagnosis not present

## 2020-01-06 DIAGNOSIS — J301 Allergic rhinitis due to pollen: Secondary | ICD-10-CM | POA: Diagnosis not present

## 2020-01-12 DIAGNOSIS — J301 Allergic rhinitis due to pollen: Secondary | ICD-10-CM | POA: Diagnosis not present

## 2020-01-17 DIAGNOSIS — J301 Allergic rhinitis due to pollen: Secondary | ICD-10-CM | POA: Diagnosis not present

## 2020-01-17 DIAGNOSIS — J3089 Other allergic rhinitis: Secondary | ICD-10-CM | POA: Diagnosis not present

## 2020-01-24 ENCOUNTER — Encounter: Payer: Self-pay | Admitting: Certified Nurse Midwife

## 2020-01-25 DIAGNOSIS — J3089 Other allergic rhinitis: Secondary | ICD-10-CM | POA: Diagnosis not present

## 2020-01-25 DIAGNOSIS — J301 Allergic rhinitis due to pollen: Secondary | ICD-10-CM | POA: Diagnosis not present

## 2020-02-13 DIAGNOSIS — J3089 Other allergic rhinitis: Secondary | ICD-10-CM | POA: Diagnosis not present

## 2020-02-13 DIAGNOSIS — J301 Allergic rhinitis due to pollen: Secondary | ICD-10-CM | POA: Diagnosis not present

## 2020-02-16 DIAGNOSIS — J301 Allergic rhinitis due to pollen: Secondary | ICD-10-CM | POA: Diagnosis not present

## 2020-02-16 DIAGNOSIS — J3089 Other allergic rhinitis: Secondary | ICD-10-CM | POA: Diagnosis not present

## 2020-02-27 DIAGNOSIS — J3089 Other allergic rhinitis: Secondary | ICD-10-CM | POA: Diagnosis not present

## 2020-02-27 DIAGNOSIS — J301 Allergic rhinitis due to pollen: Secondary | ICD-10-CM | POA: Diagnosis not present

## 2020-03-01 ENCOUNTER — Telehealth: Payer: Self-pay | Admitting: Internal Medicine

## 2020-03-01 DIAGNOSIS — E785 Hyperlipidemia, unspecified: Secondary | ICD-10-CM

## 2020-03-01 NOTE — Progress Notes (Signed)
  Chronic Care Management   Note  03/01/2020 Name: ASHMI AYENI MRN: [id number] DOB: [birth year]  MIKYLA SALATINO is a 74 y.o. year old female who is a primary care patient of Janith Lima, MD. I reached out to Ronny Bacon by phone today in response to a referral sent by Ms. Leilani Able Zappulla's PCP, Janith Lima, MD.   Ms. Bianca was given information about Chronic Care Management services today including:  1. CCM service includes personalized support from designated clinical staff supervised by her physician, including individualized plan of care and coordination with other care providers 2. 24/7 contact phone numbers for assistance for urgent and routine care needs. 3. Service will only be billed when office clinical staff spend 20 minutes or more in a month to coordinate care. 4. Only one practitioner may furnish and bill the service in a calendar month. 5. The patient may stop CCM services at any time (effective at the end of the month) by phone call to the office staff.   Patient agreed to services and verbal consent obtained.    This note is not being shared with the patient for the following reason: To respect privacy (The patient or proxy has requested that the information not be shared).  Follow up plan:   Raynicia Dukes [employer] Scheduler

## 2020-03-02 ENCOUNTER — Other Ambulatory Visit: Payer: Self-pay

## 2020-03-02 ENCOUNTER — Ambulatory Visit (INDEPENDENT_AMBULATORY_CARE_PROVIDER_SITE_OTHER): Payer: PPO | Admitting: *Deleted

## 2020-03-02 DIAGNOSIS — Z23 Encounter for immunization: Secondary | ICD-10-CM

## 2020-03-15 DIAGNOSIS — J3089 Other allergic rhinitis: Secondary | ICD-10-CM | POA: Diagnosis not present

## 2020-03-15 DIAGNOSIS — J301 Allergic rhinitis due to pollen: Secondary | ICD-10-CM | POA: Diagnosis not present

## 2020-03-26 DIAGNOSIS — J3089 Other allergic rhinitis: Secondary | ICD-10-CM | POA: Diagnosis not present

## 2020-03-26 DIAGNOSIS — J301 Allergic rhinitis due to pollen: Secondary | ICD-10-CM | POA: Diagnosis not present

## 2020-04-09 DIAGNOSIS — J301 Allergic rhinitis due to pollen: Secondary | ICD-10-CM | POA: Diagnosis not present

## 2020-04-09 DIAGNOSIS — J3089 Other allergic rhinitis: Secondary | ICD-10-CM | POA: Diagnosis not present

## 2020-04-11 ENCOUNTER — Ambulatory Visit: Payer: PPO | Admitting: Pharmacist

## 2020-04-11 ENCOUNTER — Other Ambulatory Visit: Payer: Self-pay

## 2020-04-11 DIAGNOSIS — E039 Hypothyroidism, unspecified: Secondary | ICD-10-CM

## 2020-04-11 DIAGNOSIS — J309 Allergic rhinitis, unspecified: Secondary | ICD-10-CM

## 2020-04-11 DIAGNOSIS — H1013 Acute atopic conjunctivitis, bilateral: Secondary | ICD-10-CM

## 2020-04-11 DIAGNOSIS — F5101 Primary insomnia: Secondary | ICD-10-CM

## 2020-04-11 DIAGNOSIS — E785 Hyperlipidemia, unspecified: Secondary | ICD-10-CM

## 2020-04-11 NOTE — Chronic Care Management (AMB) (Signed)
Chronic Care Management Pharmacy  Name: LAURELIN ELSON  MRN: [id number] DOB: December 13, 1945   Chief Complaint/ HPI  Maria Montgomery,  74 y.o. , female presents for their Initial CCM visit with the clinical pharmacist via telephone due to COVID-19 Pandemic.  PCP : Janith Lima, MD  Their chronic conditions include: Hyperlipidemia, Hypothyroidism and Osteoporosis, Allergies, Insomnia   Office Visits: 11/22/19 Dr Ronnald Ramp OV: conditions stable, no med changes.  Consult Visit: Tiajuana Amass (allergy): ~q2 week visits  Allergies  Allergen Reactions  . Morphine And Related Swelling    Medications: Outpatient Encounter Medications as of 04/11/2020  Medication Sig Note  . BIOTIN PO Take 10,000 mg by mouth.   . Cetirizine HCl (ZYRTEC ALLERGY) 10 MG CAPS Take by mouth as needed. Reported on 11/21/2015   . Cholecalciferol (VITAMIN D PO) Take 5,000 Int'l Units by mouth.   . estradiol (ESTRACE) 0.1 MG/GM vaginal cream Use 1/2 g vaginally twice weekly   . fluticasone (FLONASE) 50 MCG/ACT nasal spray SHAKE LQ AND U 1 TO 2 SPRAYS IEN QD   . levothyroxine (SYNTHROID) 50 MCG tablet TAKE 1 TABLET(50 MCG) BY MOUTH DAILY BEFORE BREAKFAST   . mometasone (ELOCON) 0.1 % cream Apply 1 application topically 2 (two) times daily.   . montelukast (SINGULAIR) 10 MG tablet Take 10 mg by mouth at bedtime as needed.   . zolpidem (AMBIEN) 5 MG tablet TAKE 1 TABLET(5 MG) BY MOUTH AT BEDTIME AS NEEDED FOR SLEEP   . azelastine (OPTIVAR) 0.05 % ophthalmic solution Place 1 drop into both eyes 2 (two) times daily. (Patient not taking: Reported on 04/11/2020)   . Azelastine HCl (ASTEPRO) 0.15 % SOLN by Nasal route as needed.     . Bepotastine Besilate (BEPREVE) 1.5 % SOLN Place 1 drop into both eyes daily as needed. 04/11/2020: Not covered by insurance   No facility-administered encounter medications on file as of 04/11/2020.     Current Diagnosis/Assessment:  SDOH Interventions     Most Recent Value  SDOH Interventions   Financial Strain Interventions  -- [switched eye drop for formulary copay]      Goals Addressed            This Visit's Progress   . Pharmacy Care Plan       CARE PLAN ENTRY  Current Barriers:  . Chronic Disease Management support, education, and care coordination needs related to Hyperlipidemia, Hypothyroidism, and Allergies , Insomnia   Hyperlipidemia Lab Results  Component Value Date/Time   LDLCALC 92 05/26/2019 08:43 AM   LDLDIRECT 97.6 02/05/2009 08:39 AM .  Pharmacist Clinical Goal(s): o Over the next 180 days, patient will work with PharmD and providers to maintain LDL goal < 100 . Current regimen:  o No medications . Interventions: o Discussed cholesterol goal and importance of diet and exercise to prevent cardiovascular disease . Patient self care activities - Over the next 180 days, patient will: o Continue low cholesterol diet and exercise routine  Hypothyroidism Lab Results  Component Value Date/Time   TSH 2.41 11/22/2019 09:54 AM   TSH 2.18 05/26/2019 08:43 AM .  Pharmacist Clinical Goal(s) o Over the next 180 days, patient will work with PharmD and providers to optimize therapy . Current regimen:  o Levothyroxine 50 mcg in the morning . Interventions: o Discussed different levothyroxine/Synthroid products are NOT interchangeable o Thyroid levels have been well controlled with generic levothyroxine, there is not benefit to switching to Synthroid . Patient self care activities - Over the  next 180 days, patient will: o Continue medication as prescribed  Allergies . Pharmacist Clinical Goal(s) o Over the next 180 days, patient will work with PharmD and providers to reduce costs . Current regimen:  o Bepreve eye drops as needed o Flonase nasal spray as needed o Cetirizine 10 mg as needed . Interventions: o [name] is not on formulary; contacted allergy specialist to switch to Optivo (azelastine) drops for generic copay ($12) . Patient self care  activities - Over the next 180 days, patient will: o Follow up with allergist as scheduled  Insomnia . Pharmacist Clinical Goal(s) o Over the next 180 days, patient will work with PharmD and providers to optimize therapy . Current regimen:  o Zolpidem 5 mg at bedtime as needed . Interventions: o Discussed benefits of melatonin and sleep hygiene . Patient self care activities - Over the next 180 days, patient will: o Try melatonin 5-10 mg at bedtime o Avoid bright screens and/or energetic activities within 30 min of bedtime  Medication management . Pharmacist Clinical Goal(s): o Over the next 180 days, patient will work with PharmD and providers to maintain optimal medication adherence . Current pharmacy: [facility] . Interventions o Comprehensive medication review performed. o Continue current medication management strategy . Patient self care activities - Over the next 180 days, patient will: o Focus on medication adherence by fill date o Take medications as prescribed o Report any questions or concerns to PharmD and/or provider(s)  Initial goal documentation       Hyperlipidemia   LDL goal < 100  Lipid Panel     Component Value Date/Time   CHOL 182 05/26/2019 0843   TRIG 65.0 05/26/2019 0843   HDL 77.50 05/26/2019 0843   [place] 92 05/26/2019 0843   LDLDIRECT 97.6 02/05/2009 0839     The ASCVD Risk score (Goff DC Jr., et al., 2013) failed to calculate for the following reasons:   Unable to determine if patient is Non-Hispanic African American   Patient has failed these meds in past: none Patient is currently controlled on the following medications:  . No medications  We discussed:  diet and exercise extensively  Plan  Continue control with diet and exercise  Hypothyroidism   Lab Results  Component Value Date/Time   TSH 2.41 11/22/2019 09:54 AM   TSH 2.18 05/26/2019 08:43 AM   TSH 1.65 09/16/2018 11:52 AM    Patient has failed these meds in past:  n/a Patient is currently controlled on the following medications:  . Levothyroxine 50 mcg qAM  We discussed:  Pt asked about Synthroid, she has always taken generic and TSH is controlled, there is no benefit to switching to Synthroid brand.  Plan  Continue current medications  Insomnia   Patient has failed these meds in past: n/a Patient is currently controlled on the following medications:  Marland Kitchen Zolpidem 5 mg HS  We discussed:  Pt Takes 1/2 tablet around 2am when she wakes up in the middle of the night. Discussed benefits of melatonin and sleep hygiene to promote good quality sleep.  Plan  Continue current medications  Recommend melatonin 5-10 mg HS  Allergies   Patient has failed these meds in past: n/a Patient is currently controlled on the following medications:  . Bepreve eye drops (not covered) . Azelastine 0.15% nasal spray PRN . Flonase nasal spray PRN . Mometasone 0.1% cream . Cetirizine 10 mg daily . Allergy shots q2 weeks  We discussed: Pt reports occassional nosebleeds, discussed benefits of nasal  saline. Pt also reports [name] is very expensive; examined her formulary and Bepreve is non-formulary; azelastine eye drops are covered $12 copay. Contacted prescriber to request switching to covered product.  Plan  Continue current medications  Recommend switch from Bepreve to azelastine eye drops for cost savings  Postmenopausal   Patient has failed these meds in past: n/a Patient is currently controlled on the following medications:  . Estradiol 0.1 mg/gm vaginal cream twice weekly  We discussed:  Patient is satisfied with current regimen and denies issues  Plan  Continue current medications  Osteopenia   Last DEXA Scan: 04/23/2015  T-Score femoral neck: -0.9  10-year probability of major osteoporotic fracture: 8.6%  10-year probability of hip fracture: 0.9%  VITD  Date Value Ref Range Status  11/22/2019 48.35 30.00 - 100.00 ng/mL Final    Patient  is not a candidate for pharmacologic treatment  Patient has failed these meds in past: n/a Patient is currently controlled on the following medications:   Vitamin D 5000 IU daily  We discussed:  Recommend [phone] units of vitamin D daily. Recommend 1200 mg of calcium daily from dietary and supplemental sources.  Plan  Continue current medications and control with diet and exercise  Vaccines   Reviewed and discussed patient's vaccination history.    Immunization History  Administered Date(s) Administered  . Influenza Split 09/10/2011  . Influenza Whole 08/21/2008, 08/09/2009  . Influenza, High Dose Seasonal PF 09/01/2018  . Influenza, Quadrivalent, Recombinant, Inj, Pf 07/19/2019  . Influenza,inj,Quad PF,6+ Mos 07/29/2017  . Influenza-Unspecified 09/03/2013, 08/13/2016  . PFIZER SARS-COV-2 Vaccination 11/19/2019, 12/07/2019  . Pneumococcal Conjugate-13 04/12/2014  . Pneumococcal Polysaccharide-23 08/25/2007, 04/17/2016  . Td 02/11/2010  . Tdap 03/02/2020  . Zoster 12/23/2006, 12/29/2006  . Zoster Recombinat (Shingrix) 07/29/2017, 03/07/2018    Plan  Vaccines up to date  Health Maintenance   Patient is currently controlled on the following medications:  . Biotin . Aleve PRN . Tylenol PRN  We discussed:  Pt reports occasional back pain is relieved by OTC pain relievers, she switches between Aleve and Tylenol.  Plan   Continue current medications  Medication Management   Pt uses [facility] for all medications Uses pill box? No - only 2 rx meds Pt endorses 100% compliance  We discussed: [place] a [place] [facility] and travelling to visit family; [facility] is helpful with transferring rx's when needed.  Plan  Continue current medication management strategy    Follow up: 6 month phone visit  Charlene Brooke, PharmD Clinical Pharmacist [facility] [phone]

## 2020-04-11 NOTE — Patient Instructions (Addendum)
Visit Information  Phone number for Pharmacist: [phone]  Thank you for meeting with me to discuss your medications! I look forward to working with you to achieve your health care goals. Below is a summary of what we talked about during the visit:  Goals Addressed            This Visit's Progress   . Pharmacy Care Plan       CARE PLAN ENTRY  Current Barriers:  . Chronic Disease Management support, education, and care coordination needs related to Hyperlipidemia, Hypothyroidism, and Allergies , Insomnia   Hyperlipidemia Lab Results  Component Value Date/Time   LDLCALC 92 05/26/2019 08:43 AM   LDLDIRECT 97.6 02/05/2009 08:39 AM .  Pharmacist Clinical Goal(s): o Over the next 180 days, patient will work with PharmD and providers to maintain LDL goal < 100 . Current regimen:  o No medications . Interventions: o Discussed cholesterol goal and importance of diet and exercise to prevent cardiovascular disease . Patient self care activities - Over the next 180 days, patient will: o Continue low cholesterol diet and exercise routine  Hypothyroidism Lab Results  Component Value Date/Time   TSH 2.41 11/22/2019 09:54 AM   TSH 2.18 05/26/2019 08:43 AM .  Pharmacist Clinical Goal(s) o Over the next 180 days, patient will work with PharmD and providers to optimize therapy . Current regimen:  o Levothyroxine 50 mcg in the morning . Interventions: o Discussed different levothyroxine/Synthroid products are NOT interchangeable o Thyroid levels have been well controlled with generic levothyroxine, there is not benefit to switching to Synthroid . Patient self care activities - Over the next 180 days, patient will: o Continue medication as prescribed  Allergies . Pharmacist Clinical Goal(s) o Over the next 180 days, patient will work with PharmD and providers to reduce costs . Current regimen:  o Bepreve eye drops as needed o Flonase nasal spray as needed o Cetirizine 10 mg as  needed . Interventions: o [name] is not on formulary; contacted allergy specialist to switch to Optivo (azelastine) drops for generic copay ($12) . Patient self care activities - Over the next 180 days, patient will: o Follow up with allergist as scheduled  Insomnia . Pharmacist Clinical Goal(s) o Over the next 180 days, patient will work with PharmD and providers to optimize therapy . Current regimen:  o Zolpidem 5 mg at bedtime as needed . Interventions: o Discussed benefits of melatonin and sleep hygiene . Patient self care activities - Over the next 180 days, patient will: o Try melatonin 5-10 mg at bedtime o Avoid bright screens and/or energetic activities within 30 min of bedtime  Medication management . Pharmacist Clinical Goal(s): o Over the next 180 days, patient will work with PharmD and providers to maintain optimal medication adherence . Current pharmacy: [facility] . Interventions o Comprehensive medication review performed. o Continue current medication management strategy . Patient self care activities - Over the next 180 days, patient will: o Focus on medication adherence by fill date o Take medications as prescribed o Report any questions or concerns to PharmD and/or provider(s)  Initial goal documentation       Maria Montgomery was given information about Chronic Care Management services today including:  1. CCM service includes personalized support from designated clinical staff supervised by her physician, including individualized plan of care and coordination with other care providers 2. 24/7 contact phone numbers for assistance for urgent and routine care needs. 3. Standard insurance, coinsurance, copays and deductibles apply for chronic  care management only during months in which we provide at least 20 minutes of these services. Most insurances cover these services at 100%, however patients may be responsible for any copay, coinsurance and/or deductible if  applicable. This service may help you avoid the need for more expensive face-to-face services. 4. Only one practitioner may furnish and bill the service in a calendar month. 5. The patient may stop CCM services at any time (effective at the end of the month) by phone call to the office staff.  Patient agreed to services and verbal consent obtained.   The patient verbalized understanding of instructions provided today and agreed to receive a mailed copy of patient instruction and/or educational materials. Telephone follow up appointment with pharmacy team member scheduled for: 6 months  Charlene Brooke, PharmD Clinical Pharmacist [facility] [phone]  Quality Sleep Information, Adult Quality sleep is important for your mental and physical health. It also improves your quality of life. Quality sleep means you:  Are asleep for most of the time you are in bed.  Fall asleep within 30 minutes.  Wake up no more than once a night.  Are awake for no longer than 20 minutes if you do wake up during the night. Most adults need 7-8 hours of quality sleep each night. How can poor sleep affect me? If you do not get enough quality sleep, you may have:  Mood swings.  Daytime sleepiness.  Confusion.  Decreased reaction time.  Sleep disorders, such as insomnia and sleep apnea.  Difficulty with: ? Solving problems. ? Coping with stress. ? Paying attention. These issues may affect your performance and productivity at work, school, and at home. Lack of sleep may also put you at higher risk for accidents, suicide, and risky behaviors. If you do not get quality sleep you may also be at higher risk for several health problems, including:  Infections.  Type 2 diabetes.  Heart disease.  High blood pressure.  Obesity.  Worsening of long-term conditions, like arthritis, kidney disease, depression, Parkinson's disease, and epilepsy. What actions can I take to  get more quality sleep?      Stick to a sleep schedule. Go to sleep and wake up at about the same time each day. Do not try to sleep less on weekdays and make up for lost sleep on weekends. This does not work.  Try to get about 30 minutes of exercise on most days. Do not exercise 2-3 hours before going to bed.  Limit naps during the day to 30 minutes or less.  Do not use any products that contain nicotine or tobacco, such as cigarettes or e-cigarettes. If you need help quitting, ask your health care provider.  Do not drink caffeinated beverages for at least 8 hours before going to bed. Coffee, tea, and some sodas contain caffeine.  Do not drink alcohol close to bedtime.  Do not eat large meals close to bedtime.  Do not take naps in the late afternoon.  Try to get at least 30 minutes of sunlight every day. Morning sunlight is best.  Make time to relax before bed. Reading, listening to music, or taking a hot bath promotes quality sleep.  Make your bedroom a place that promotes quality sleep. Keep your bedroom dark, quiet, and at a comfortable room temperature. Make sure your bed is comfortable. Take out sleep distractions like TV, a computer, smartphone, and bright lights.  If you are lying awake in bed for longer than 20  minutes, get up and do a relaxing activity until you feel sleepy.  Work with your health care provider to treat medical conditions that may affect sleeping, such as: ? Nasal obstruction. ? Snoring. ? Sleep apnea and other sleep disorders.  Talk to your health care provider if you think any of your prescription medicines may cause you to have difficulty falling or staying asleep.  If you have sleep problems, talk with a sleep consultant. If you think you have a sleep disorder, talk with your health care provider about getting evaluated by a specialist. Where to find more information  [place] website: https://sleepfoundation.org  National  Heart, Lung, and [place]): http://www.saunders.info/.pdf  Centers for Disease Control and Prevention (CDC): LearningDermatology.pl Contact a health care provider if you:  Have trouble getting to sleep or staying asleep.  Often wake up very early in the morning and cannot get back to sleep.  Have daytime sleepiness.  Have daytime sleep attacks of suddenly falling asleep and sudden muscle weakness (narcolepsy).  Have a tingling sensation in your legs with a strong urge to move your legs (restless legs syndrome).  Stop breathing briefly during sleep (sleep apnea).  Think you have a sleep disorder or are taking a medicine that is affecting your quality of sleep. Summary  Most adults need 7-8 hours of quality sleep each night.  Getting enough quality sleep is an important part of health and well-being.  Make your bedroom a place that promotes quality sleep and avoid things that may cause you to have poor sleep, such as alcohol, caffeine, smoking, and large meals.  Talk to your health care provider if you have trouble falling asleep or staying asleep. This information is not intended to replace advice given to you by your health care provider. Make sure you discuss any questions you have with your health care provider. Document Revised: 01/27/2018 Document Reviewed: 01/27/2018 Elsevier Patient Education  [place].

## 2020-04-13 NOTE — Addendum Note (Signed)
Addended by: Aviva Signs M on: 04/13/2020 11:44 AM   Modules accepted: Orders

## 2020-04-20 DIAGNOSIS — Z1231 Encounter for screening mammogram for malignant neoplasm of breast: Secondary | ICD-10-CM | POA: Diagnosis not present

## 2020-04-20 LAB — HM MAMMOGRAPHY

## 2020-04-25 ENCOUNTER — Encounter: Payer: Self-pay | Admitting: Internal Medicine

## 2020-04-25 DIAGNOSIS — J3089 Other allergic rhinitis: Secondary | ICD-10-CM | POA: Diagnosis not present

## 2020-04-25 DIAGNOSIS — J301 Allergic rhinitis due to pollen: Secondary | ICD-10-CM | POA: Diagnosis not present

## 2020-04-25 DIAGNOSIS — J3081 Allergic rhinitis due to animal (cat) (dog) hair and dander: Secondary | ICD-10-CM | POA: Diagnosis not present

## 2020-05-02 DIAGNOSIS — J3089 Other allergic rhinitis: Secondary | ICD-10-CM | POA: Diagnosis not present

## 2020-05-17 ENCOUNTER — Other Ambulatory Visit: Payer: Self-pay | Admitting: Internal Medicine

## 2020-05-17 DIAGNOSIS — E039 Hypothyroidism, unspecified: Secondary | ICD-10-CM

## 2020-05-17 DIAGNOSIS — E890 Postprocedural hypothyroidism: Secondary | ICD-10-CM

## 2020-05-21 DIAGNOSIS — J3089 Other allergic rhinitis: Secondary | ICD-10-CM | POA: Diagnosis not present

## 2020-05-21 DIAGNOSIS — J301 Allergic rhinitis due to pollen: Secondary | ICD-10-CM | POA: Diagnosis not present

## 2020-05-22 DIAGNOSIS — H524 Presbyopia: Secondary | ICD-10-CM | POA: Diagnosis not present

## 2020-05-22 DIAGNOSIS — H52202 Unspecified astigmatism, left eye: Secondary | ICD-10-CM | POA: Diagnosis not present

## 2020-05-22 DIAGNOSIS — H04123 Dry eye syndrome of bilateral lacrimal glands: Secondary | ICD-10-CM | POA: Diagnosis not present

## 2020-05-22 DIAGNOSIS — H353131 Nonexudative age-related macular degeneration, bilateral, early dry stage: Secondary | ICD-10-CM | POA: Diagnosis not present

## 2020-05-22 DIAGNOSIS — H5212 Myopia, left eye: Secondary | ICD-10-CM | POA: Diagnosis not present

## 2020-05-22 DIAGNOSIS — Z961 Presence of intraocular lens: Secondary | ICD-10-CM | POA: Diagnosis not present

## 2020-05-23 ENCOUNTER — Ambulatory Visit: Payer: PPO | Admitting: Internal Medicine

## 2020-05-24 ENCOUNTER — Other Ambulatory Visit: Payer: Self-pay

## 2020-05-24 ENCOUNTER — Encounter: Payer: Self-pay | Admitting: Internal Medicine

## 2020-05-24 ENCOUNTER — Ambulatory Visit (INDEPENDENT_AMBULATORY_CARE_PROVIDER_SITE_OTHER): Payer: PPO | Admitting: Internal Medicine

## 2020-05-24 VITALS — BP 124/84 | HR 96 | Temp 98.3°F | Resp 16 | Ht 59.5 in | Wt 137.0 lb

## 2020-05-24 DIAGNOSIS — E039 Hypothyroidism, unspecified: Secondary | ICD-10-CM | POA: Diagnosis not present

## 2020-05-24 DIAGNOSIS — E559 Vitamin D deficiency, unspecified: Secondary | ICD-10-CM | POA: Diagnosis not present

## 2020-05-24 DIAGNOSIS — F5101 Primary insomnia: Secondary | ICD-10-CM | POA: Diagnosis not present

## 2020-05-24 DIAGNOSIS — E785 Hyperlipidemia, unspecified: Secondary | ICD-10-CM

## 2020-05-24 DIAGNOSIS — Z0000 Encounter for general adult medical examination without abnormal findings: Secondary | ICD-10-CM | POA: Diagnosis not present

## 2020-05-24 LAB — CBC WITH DIFFERENTIAL/PLATELET
Absolute Monocytes: 1330 cells/uL — ABNORMAL HIGH (ref 200–950)
Basophils Absolute: 74 cells/uL (ref 0–200)
Basophils Relative: 0.8 %
Eosinophils Absolute: 149 cells/uL (ref 15–500)
Eosinophils Relative: 1.6 %
HCT: 43.5 % (ref 35.0–45.0)
Hemoglobin: 14.6 g/dL (ref 11.7–15.5)
Lymphs Abs: 1702 cells/uL (ref 850–3900)
MCH: 30.9 pg (ref 27.0–33.0)
MCHC: 33.6 g/dL (ref 32.0–36.0)
MCV: 92 fL (ref 80.0–100.0)
MPV: 10 fL (ref 7.5–12.5)
Monocytes Relative: 14.3 %
Neutro Abs: 6045 cells/uL (ref 1500–7800)
Neutrophils Relative %: 65 %
Platelets: 262 10*3/uL (ref 140–400)
RBC: 4.73 10*6/uL (ref 3.80–5.10)
RDW: 12.2 % (ref 11.0–15.0)
Total Lymphocyte: 18.3 %
WBC: 9.3 10*3/uL (ref 3.8–10.8)

## 2020-05-24 LAB — HEPATIC FUNCTION PANEL
AG Ratio: 1.8 (calc) (ref 1.0–2.5)
ALT: 17 U/L (ref 6–29)
AST: 21 U/L (ref 10–35)
Albumin: 4.5 g/dL (ref 3.6–5.1)
Alkaline phosphatase (APISO): 60 U/L (ref 37–153)
Bilirubin, Direct: 0.1 mg/dL (ref 0.0–0.2)
Globulin: 2.5 g/dL (calc) (ref 1.9–3.7)
Indirect Bilirubin: 0.7 mg/dL (calc) (ref 0.2–1.2)
Total Bilirubin: 0.8 mg/dL (ref 0.2–1.2)
Total Protein: 7 g/dL (ref 6.1–8.1)

## 2020-05-24 LAB — LIPID PANEL
Cholesterol: 217 mg/dL — ABNORMAL HIGH (ref <200)
HDL: 87 mg/dL (ref >=50)
LDL Cholesterol (Calc): 108 mg/dL (calc) — ABNORMAL HIGH
Non-HDL Cholesterol (Calc): 130 mg/dL (calc) — ABNORMAL HIGH (ref <130)
Total CHOL/HDL Ratio: 2.5 (calc) (ref <5.0)
Triglycerides: 111 mg/dL (ref <150)

## 2020-05-24 LAB — BASIC METABOLIC PANEL
BUN: 19 mg/dL (ref 7–25)
CO2: 25 mmol/L (ref 20–32)
Calcium: 9.4 mg/dL (ref 8.6–10.4)
Chloride: 103 mmol/L (ref 98–110)
Creat: 0.89 mg/dL (ref 0.60–0.93)
Glucose, Bld: 92 mg/dL (ref 65–99)
Potassium: 4.5 mmol/L (ref 3.5–5.3)
Sodium: 138 mmol/L (ref 135–146)

## 2020-05-24 LAB — TSH: TSH: 2.61 mIU/L (ref 0.40–4.50)

## 2020-05-24 LAB — VITAMIN D 25 HYDROXY (VIT D DEFICIENCY, FRACTURES): Vit D, 25-Hydroxy: 34 ng/mL (ref 30–100)

## 2020-05-24 MED ORDER — ZOLPIDEM TARTRATE 5 MG PO TABS: ORAL_TABLET | ORAL | 3 refills | Status: DC

## 2020-05-24 NOTE — Patient Instructions (Signed)
Health Maintenance, Female Adopting a healthy lifestyle and getting preventive care are important in promoting health and wellness. Ask your health care provider about:  The right schedule for you to have regular tests and exams.  Things you can do on your own to prevent diseases and keep yourself healthy. What should I know about diet, weight, and exercise? Eat a healthy diet   Eat a diet that includes plenty of vegetables, fruits, low-fat dairy products, and lean protein.  Do not eat a lot of foods that are high in solid fats, added sugars, or sodium. Maintain a healthy weight Body mass index (BMI) is used to identify weight problems. It estimates body fat based on height and weight. Your health care provider can help determine your BMI and help you achieve or maintain a healthy weight. Get regular exercise Get regular exercise. This is one of the most important things you can do for your health. Most adults should:  Exercise for at least 150 minutes each week. The exercise should increase your heart rate and make you sweat (moderate-intensity exercise).  Do strengthening exercises at least twice a week. This is in addition to the moderate-intensity exercise.  Spend less time sitting. Even light physical activity can be beneficial. Watch cholesterol and blood lipids Have your blood tested for lipids and cholesterol at 76 years of age, then have this test every 5 years. Have your cholesterol levels checked more often if:  Your lipid or cholesterol levels are high.  You are older than 76 years of age.  You are at high risk for heart disease. What should I know about cancer screening? Depending on your health history and family history, you may need to have cancer screening at various ages. This may include screening for:  Breast cancer.  Cervical cancer.  Colorectal cancer.  Skin cancer.  Lung cancer. What should I know about heart disease, diabetes, and high blood  pressure? Blood pressure and heart disease  High blood pressure causes heart disease and increases the risk of stroke. This is more likely to develop in people who have high blood pressure readings, are of African descent, or are overweight.  Have your blood pressure checked: ? Every 3-5 years if you are 18-39 years of age. ? Every year if you are 40 years old or older. Diabetes Have regular diabetes screenings. This checks your fasting blood sugar level. Have the screening done:  Once every three years after age 40 if you are at a normal weight and have a low risk for diabetes.  More often and at a younger age if you are overweight or have a high risk for diabetes. What should I know about preventing infection? Hepatitis B If you have a higher risk for hepatitis B, you should be screened for this virus. Talk with your health care provider to find out if you are at risk for hepatitis B infection. Hepatitis C Testing is recommended for:  Everyone born from 1945 through 1965.  Anyone with known risk factors for hepatitis C. Sexually transmitted infections (STIs)  Get screened for STIs, including gonorrhea and chlamydia, if: ? You are sexually active and are younger than 76 years of age. ? You are older than 76 years of age and your health care provider tells you that you are at risk for this type of infection. ? Your sexual activity has changed since you were last screened, and you are at increased risk for chlamydia or gonorrhea. Ask your health care provider if   you are at risk.  Ask your health care provider about whether you are at high risk for HIV. Your health care provider may recommend a prescription medicine to help prevent HIV infection. If you choose to take medicine to prevent HIV, you should first get tested for HIV. You should then be tested every 3 months for as long as you are taking the medicine. Pregnancy  If you are about to stop having your period (premenopausal) and  you may become pregnant, seek counseling before you get pregnant.  Take 400 to 800 micrograms (mcg) of folic acid every day if you become pregnant.  Ask for birth control (contraception) if you want to prevent pregnancy. Osteoporosis and menopause Osteoporosis is a disease in which the bones lose minerals and strength with aging. This can result in bone fractures. If you are 65 years old or older, or if you are at risk for osteoporosis and fractures, ask your health care provider if you should:  Be screened for bone loss.  Take a calcium or vitamin D supplement to lower your risk of fractures.  Be given hormone replacement therapy (HRT) to treat symptoms of menopause. Follow these instructions at home: Lifestyle  Do not use any products that contain nicotine or tobacco, such as cigarettes, e-cigarettes, and chewing tobacco. If you need help quitting, ask your health care provider.  Do not use street drugs.  Do not share needles.  Ask your health care provider for help if you need support or information about quitting drugs. Alcohol use  Do not drink alcohol if: ? Your health care provider tells you not to drink. ? You are pregnant, may be pregnant, or are planning to become pregnant.  If you drink alcohol: ? Limit how much you use to 0-1 drink a day. ? Limit intake if you are breastfeeding.  Be aware of how much alcohol is in your drink. In the [place] one drink equals one 12 oz bottle of beer (355 mL), one 5 oz glass of wine (148 mL), or one 1 oz glass of hard liquor (44 mL). General instructions  Schedule regular health, dental, and eye exams.  Stay current with your vaccines.  Tell your health care provider if: ? You often feel depressed. ? You have ever been abused or do not feel safe at home. Summary  Adopting a healthy lifestyle and getting preventive care are important in promoting health and wellness.  Follow your health care provider's instructions about healthy  diet, exercising, and getting tested or screened for diseases.  Follow your health care provider's instructions on monitoring your cholesterol and blood pressure. This information is not intended to replace advice given to you by your health care provider. Make sure you discuss any questions you have with your health care provider. Document Revised: 10/13/2018 Document Reviewed: 10/13/2018 Elsevier Patient Education  2020 Elsevier Inc.  

## 2020-05-24 NOTE — Progress Notes (Signed)
Subjective:  Patient ID: Maria Montgomery, female    DOB: 03/09/1946  Age: 74 y.o. MRN: [id number]  CC: Annual Exam, Hypothyroidism, and Hyperlipidemia  This visit occurred during the SARS-CoV-2 public health emergency.  Safety protocols were in place, including screening questions prior to the visit, additional usage of staff PPE, and extensive cleaning of exam room while observing appropriate contact time as indicated for disinfecting solutions.    HPI Maria Montgomery presents for a CPX.  She is very active and denies any recent episodes of chest pain, shortness of breath, diaphoresis, palpitations, edema, or fatigue.  She feels like her thyroid dosage is in the normal range.  Outpatient Medications Prior to Visit  Medication Sig Dispense Refill  . azelastine (OPTIVAR) 0.05 % ophthalmic solution Place 1 drop into both eyes 2 (two) times daily. 6 mL 5  . Azelastine HCl (ASTEPRO) 0.15 % SOLN by Nasal route as needed.      . Bepotastine Besilate (BEPREVE) 1.5 % SOLN Place 1 drop into both eyes daily as needed.    Marland Kitchen BIOTIN PO Take 10,000 mg by mouth.    . Cetirizine HCl (ZYRTEC ALLERGY) 10 MG CAPS Take by mouth as needed. Reported on 11/21/2015    . Cholecalciferol (VITAMIN D PO) Take 5,000 Int'l Units by mouth.    . estradiol (ESTRACE) 0.1 MG/GM vaginal cream Use 1/2 g vaginally twice weekly 42.5 g 3  . fluticasone (FLONASE) 50 MCG/ACT nasal spray SHAKE LQ AND U 1 TO 2 SPRAYS IEN QD    . levothyroxine (SYNTHROID) 50 MCG tablet TAKE 1 TABLET(50 MCG) BY MOUTH DAILY BEFORE BREAKFAST 90 tablet 0  . mometasone (ELOCON) 0.1 % cream Apply 1 application topically 2 (two) times daily. 45 g 0  . montelukast (SINGULAIR) 10 MG tablet Take 10 mg by mouth at bedtime as needed.    . zolpidem (AMBIEN) 5 MG tablet TAKE 1 TABLET(5 MG) BY MOUTH AT BEDTIME AS NEEDED FOR SLEEP 30 tablet 3   No facility-administered medications prior to visit.    ROS Review of Systems  Constitutional: Negative for appetite  change, diaphoresis, fatigue and unexpected weight change.  HENT: Negative.   Eyes: Negative for visual disturbance.  Respiratory: Negative for cough, chest tightness, shortness of breath and wheezing.   Cardiovascular: Negative for chest pain, palpitations and leg swelling.  Gastrointestinal: Negative for abdominal pain, constipation, nausea and rectal pain.  Endocrine: Negative for cold intolerance and heat intolerance.  Genitourinary: Negative.  Negative for difficulty urinating.  Musculoskeletal: Negative.  Negative for arthralgias and myalgias.  Skin: Negative for color change, pallor and rash.  Neurological: Negative for dizziness, weakness, light-headedness and numbness.  Hematological: Negative for adenopathy. Does not bruise/bleed easily.  Psychiatric/Behavioral: Positive for sleep disturbance (FA). Negative for behavioral problems, confusion, decreased concentration, dysphoric mood and suicidal ideas.    Objective:  BP 124/84   Pulse 96   Temp 98.3 F (36.8 C) (Oral)   Resp 16   Ht 4' 11.5" (1.511 m)   Wt 137 lb (62.1 kg)   LMP 07/28/1987   SpO2 98%   BMI 27.21 kg/m   BP Readings from Last 3 Encounters:  05/24/20 124/84  11/22/19 122/78  05/26/19 120/80    Wt Readings from Last 3 Encounters:  05/24/20 137 lb (62.1 kg)  11/22/19 138 lb (62.6 kg)  05/26/19 138 lb 1.3 oz (62.6 kg)    Physical Exam Vitals reviewed.  Constitutional:      Appearance: Normal appearance.  HENT:  Nose: Nose normal.     Mouth/Throat:     Mouth: Mucous membranes are moist.  Eyes:     General: No scleral icterus.    Conjunctiva/sclera: Conjunctivae normal.  Neck:     Thyroid: No thyroid mass, thyromegaly or thyroid tenderness.  Cardiovascular:     Rate and Rhythm: Normal rate and regular rhythm.     Pulses: Normal pulses.     Heart sounds: No murmur heard.  No gallop.   Pulmonary:     Effort: Pulmonary effort is normal.     Breath sounds: No stridor. No wheezing or rales.    Abdominal:     General: Abdomen is flat.     Palpations: There is no mass.     Tenderness: There is no abdominal tenderness. There is no guarding.  Musculoskeletal:        General: Normal range of motion.     Cervical back: Neck supple.  Lymphadenopathy:     Cervical: No cervical adenopathy.  Skin:    General: Skin is warm.     Findings: No lesion or rash.  Neurological:     Mental Status: She is alert and oriented to person, place, and time. Mental status is at baseline.  Psychiatric:        Mood and Affect: Mood normal.        Behavior: Behavior normal.     Lab Results  Component Value Date   WBC 9.3 05/24/2020   HGB 14.6 05/24/2020   HCT 43.5 05/24/2020   PLT 262 05/24/2020   GLUCOSE 92 05/24/2020   CHOL 217 (H) 05/24/2020   TRIG 111 05/24/2020   HDL 87 05/24/2020   LDLDIRECT 97.6 02/05/2009   LDLCALC 108 (H) 05/24/2020   ALT 17 05/24/2020   AST 21 05/24/2020   NA 138 05/24/2020   K 4.5 05/24/2020   CL 103 05/24/2020   CREATININE 0.89 05/24/2020   BUN 19 05/24/2020   CO2 25 05/24/2020   TSH 2.61 05/24/2020    DG Finger Ring Left  Result Date: 04/22/2018 CLINICAL DATA:  Injured the fourth finger 3 weeks ago with pain and swelling of the PIP joint EXAM: LEFT RING FINGER 2+V COMPARISON:  Left hand films of 05/14/2015 FINDINGS: There may be a tiny nondisplaced avulsion from the distal aspect of the proximal phalanx of the left fourth digit with adjacent soft tissue swelling. No other acute abnormality is seen. Mild degenerative changes present involving the DIP and PIP joints IMPRESSION: 1. Questionable small avulsion fragment from the distal aspect of the proximal phalanx of the left fourth digit with soft tissue swelling. 2. Degenerative change of the DIP joints. Electronically Signed   By: Ivar Drape M.D.   On: 04/22/2018 08:46    Assessment & Plan:   [name] was seen today for annual exam, hypothyroidism and hyperlipidemia.  Diagnoses and all orders for this  visit:  Acquired hypothyroidism- Her TSH is in the normal range.  She will stay on the current dose of levothyroxine. -     CBC with Differential/Platelet; Future -     TSH; Future -     TSH -     CBC with Differential/Platelet  Primary insomnia -     zolpidem (AMBIEN) 5 MG tablet; One po qHS prn insomnia  Vitamin D deficiency -     Basic metabolic panel; Future -     VITAMIN D 25 Hydroxy (Vit-D Deficiency, Fractures); Future -     VITAMIN D 25 Hydroxy (  Vit-D Deficiency, Fractures) -     Basic metabolic panel  Hyperlipidemia LDL goal <100- Her ASCVD risk score is just above 15%.  I recommended that she take a statin for CV risk reduction. -     Lipid panel; Future -     Hepatic function panel; Future -     Hepatic function panel -     Lipid panel -     Pitavastatin Calcium (LIVALO) 1 MG TABS; Take 1 tablet (1 mg total) by mouth daily.  Routine health maintenance- Exam completed, labs reviewed, vaccines reviewed and updated, cancer screenings are up-to-date, patient education was given.   I have changed Sherman C. Gebhard "Madalynn Carol"'s zolpidem. I am also having her start on Livalo. Additionally, I am having her maintain her Azelastine HCl, Cetirizine HCl, mometasone, azelastine, BIOTIN PO, Cholecalciferol (VITAMIN D PO), fluticasone, estradiol, montelukast, Bepreve, and levothyroxine.  Meds ordered this encounter  Medications  . zolpidem (AMBIEN) 5 MG tablet    Sig: One po qHS prn insomnia    Dispense:  30 tablet    Refill:  3  . Pitavastatin Calcium (LIVALO) 1 MG TABS    Sig: Take 1 tablet (1 mg total) by mouth daily.    Dispense:  90 tablet    Refill:  1     Follow-up: Return in about 6 months (around 11/24/2020).  Scarlette Calico, MD

## 2020-05-24 NOTE — Progress Notes (Signed)
Opened in error.  Appointment cancelled and rescheduled.

## 2020-05-25 ENCOUNTER — Encounter: Payer: PPO | Admitting: Obstetrics and Gynecology

## 2020-05-25 ENCOUNTER — Ambulatory Visit: Payer: PPO | Admitting: Certified Nurse Midwife

## 2020-05-25 MED ORDER — LIVALO 1 MG PO TABS: 1.0000 | ORAL_TABLET | Freq: Every day | ORAL | 1 refills | Status: DC

## 2020-05-28 DIAGNOSIS — J3089 Other allergic rhinitis: Secondary | ICD-10-CM | POA: Diagnosis not present

## 2020-05-28 DIAGNOSIS — J301 Allergic rhinitis due to pollen: Secondary | ICD-10-CM | POA: Diagnosis not present

## 2020-05-30 DIAGNOSIS — J3089 Other allergic rhinitis: Secondary | ICD-10-CM | POA: Diagnosis not present

## 2020-05-31 ENCOUNTER — Telehealth: Payer: Self-pay | Admitting: Internal Medicine

## 2020-05-31 ENCOUNTER — Other Ambulatory Visit: Payer: Self-pay | Admitting: Internal Medicine

## 2020-05-31 DIAGNOSIS — E785 Hyperlipidemia, unspecified: Secondary | ICD-10-CM

## 2020-05-31 MED ORDER — ROSUVASTATIN CALCIUM 5 MG PO TABS: 5.0000 mg | ORAL_TABLET | Freq: Every day | ORAL | 1 refills | Status: DC

## 2020-05-31 NOTE — Telephone Encounter (Signed)
Pt contacted and she stated that the cost was $90 for a 90 day supply.   Pt is requesting a different medication to treat her risk of heart attack and stroke.

## 2020-05-31 NOTE — Telephone Encounter (Signed)
     Pt c/o medication issue:  1. Name of Medication: Pitavastatin Calcium (LIVALO) 1 MG TABS  2. How are you currently taking this medication (dosage and times per day)? n/a  3. Are you having a reaction (difficulty breathing--STAT)? n/a  4. What is your medication issue? Patient declined to pay $90 for medication, states she would like to discuss alternatives

## 2020-06-06 DIAGNOSIS — J301 Allergic rhinitis due to pollen: Secondary | ICD-10-CM | POA: Diagnosis not present

## 2020-06-06 DIAGNOSIS — J3081 Allergic rhinitis due to animal (cat) (dog) hair and dander: Secondary | ICD-10-CM | POA: Diagnosis not present

## 2020-06-18 DIAGNOSIS — J3089 Other allergic rhinitis: Secondary | ICD-10-CM | POA: Diagnosis not present

## 2020-06-18 DIAGNOSIS — J301 Allergic rhinitis due to pollen: Secondary | ICD-10-CM | POA: Diagnosis not present

## 2020-06-20 DIAGNOSIS — J301 Allergic rhinitis due to pollen: Secondary | ICD-10-CM | POA: Diagnosis not present

## 2020-06-20 DIAGNOSIS — J3089 Other allergic rhinitis: Secondary | ICD-10-CM | POA: Diagnosis not present

## 2020-06-27 DIAGNOSIS — J301 Allergic rhinitis due to pollen: Secondary | ICD-10-CM | POA: Diagnosis not present

## 2020-06-27 DIAGNOSIS — J3089 Other allergic rhinitis: Secondary | ICD-10-CM | POA: Diagnosis not present

## 2020-07-03 ENCOUNTER — Ambulatory Visit: Payer: PPO | Attending: Internal Medicine

## 2020-07-03 DIAGNOSIS — Z23 Encounter for immunization: Secondary | ICD-10-CM

## 2020-07-03 NOTE — Progress Notes (Signed)
   [facility]ov[facility] Vaccination [facility]  Name:  Maria Montgomery    MRN: [id number] DOB: [birth year]  07/03/2020  Maria Montgomery was observed post Covid-19 immunization for 15 minutes without incident. She was provided with Vaccine Information Sheet and instruction to access the V-Safe system.   Maria Montgomery was instructed to call 911 with any severe reactions post vaccine: Marland Kitchen Difficulty breathing  . Swelling of face and throat  . A fast heartbeat  . A bad rash all over body  . Dizziness and weakness

## 2020-07-05 ENCOUNTER — Other Ambulatory Visit: Payer: Self-pay | Admitting: Internal Medicine

## 2020-07-05 ENCOUNTER — Telehealth: Payer: Self-pay | Admitting: Obstetrics and Gynecology

## 2020-07-05 ENCOUNTER — Telehealth: Payer: Self-pay | Admitting: Internal Medicine

## 2020-07-05 DIAGNOSIS — N3000 Acute cystitis without hematuria: Secondary | ICD-10-CM | POA: Insufficient documentation

## 2020-07-05 MED ORDER — NITROFURANTOIN MONOHYD MACRO 100 MG PO CAPS: 100.0000 mg | ORAL_CAPSULE | Freq: Two times a day (BID) | ORAL | 0 refills | Status: AC

## 2020-07-05 NOTE — Telephone Encounter (Signed)
AEX 7/20-DL, cancelled AEX 05/2020 with BS   Spoke with pt. Pt states having urinary frequency, urgency, bladder pressure x 2 days. Denies fever, chills, back pain.   States is currently out of town at [employer]. Pt wanting to know if can have Rx called in. Pt advised we would suggest an OV to have Rx. We do not treat over the phone. Pt advised can be seen at local Urgent care or call PCP. Pt agreeable and verbalized understanding.  Encounter closed.

## 2020-07-05 NOTE — Telephone Encounter (Signed)
Patient is having bladder pressure. She is out of town and would like nurse to call.

## 2020-07-05 NOTE — Telephone Encounter (Signed)
   Patient states she is out of town, is experiencing symptoms of UTI. Requesting medication be called to pharmacy: [place], [address] AT [employer]

## 2020-07-07 ENCOUNTER — Encounter: Payer: Self-pay | Admitting: Internal Medicine

## 2020-07-09 ENCOUNTER — Telehealth: Payer: Self-pay

## 2020-07-09 NOTE — Telephone Encounter (Signed)
LVM for pt to RTN my call to R/S AWV with [facility]

## 2020-07-10 ENCOUNTER — Other Ambulatory Visit: Payer: Self-pay | Admitting: Internal Medicine

## 2020-07-16 DIAGNOSIS — J3089 Other allergic rhinitis: Secondary | ICD-10-CM | POA: Diagnosis not present

## 2020-07-16 DIAGNOSIS — J301 Allergic rhinitis due to pollen: Secondary | ICD-10-CM | POA: Diagnosis not present

## 2020-07-20 ENCOUNTER — Ambulatory Visit (INDEPENDENT_AMBULATORY_CARE_PROVIDER_SITE_OTHER): Payer: PPO

## 2020-07-20 ENCOUNTER — Telehealth: Payer: Self-pay | Admitting: Obstetrics and Gynecology

## 2020-07-20 ENCOUNTER — Other Ambulatory Visit: Payer: Self-pay

## 2020-07-20 VITALS — BP 120/70 | HR 74 | Temp 98.3°F | Resp 16 | Ht 60.0 in | Wt 134.8 lb

## 2020-07-20 DIAGNOSIS — Z0000 Encounter for general adult medical examination without abnormal findings: Secondary | ICD-10-CM | POA: Diagnosis not present

## 2020-07-20 NOTE — Telephone Encounter (Signed)
Patient needs to get an order for bone density test.

## 2020-07-20 NOTE — Patient Instructions (Addendum)
Maria Montgomery , Thank you for taking time to come for your Medicare Wellness Visit. I appreciate your ongoing commitment to your health goals. Please review the following plan we discussed and let me know if I can assist you in the future.   Screening recommendations/referrals: Colonoscopy: no repeat due to age 74: 04/20/2020 Bone Density: 04/23/2015; due every 5 years Recommended yearly ophthalmology/optometry visit for glaucoma screening and checkup Recommended yearly dental visit for hygiene and checkup  Vaccinations: Influenza vaccine: 07/19/2019 Pneumococcal vaccine: completed Tdap vaccine: 03/02/2020; due every 10 years Shingles vaccine: completed  (Shingrix) Covid-19: completed Therapist, music)   Advanced directives: Please bring a copy of your health care power of attorney and living will to the office at your convenience.  Conditions/risks identified: Yes; Reviewed health maintenance screenings with patient today and relevant education, vaccines, and/or referrals were provided. Please continue to do your personal lifestyle choices by: daily care of teeth and gums, regular physical activity (goal should be 5 days a week for 30 minutes), eat a healthy diet, avoid tobacco and drug use, limiting any alcohol intake, taking a low-dose aspirin (if not allergic or have been advised by your provider otherwise) and taking vitamins and minerals as recommended by your provider. Continue doing brain stimulating activities (puzzles, reading, adult coloring books, staying active) to keep memory sharp. Continue to eat heart healthy diet (full of fruits, vegetables, whole grains, lean protein, water--limit salt, fat, and sugar intake) and increase physical activity as tolerated.  Next appointment: Please schedule your next Medicare Wellness Visit with your Nurse Health Advisor in 1 year.  Preventive Care 66 Years and Older, Female Preventive care refers to lifestyle choices and visits with your health care  provider that can promote health and wellness. What does preventive care include?  A yearly physical exam. This is also called an annual well check.  Dental exams once or twice a year.  Routine eye exams. Ask your health care provider how often you should have your eyes checked.  Personal lifestyle choices, including:  Daily care of your teeth and gums.  Regular physical activity.  Eating a healthy diet.  Avoiding tobacco and drug use.  Limiting alcohol use.  Practicing safe sex.  Taking low-dose aspirin every day.  Taking vitamin and mineral supplements as recommended by your health care provider. What happens during an annual well check? The services and screenings done by your health care provider during your annual well check will depend on your age, overall health, lifestyle risk factors, and family history of disease. Counseling  Your health care provider may ask you questions about your:  Alcohol use.  Tobacco use.  Drug use.  Emotional well-being.  Home and relationship well-being.  Sexual activity.  Eating habits.  History of falls.  Memory and ability to understand (cognition).  Work and work Statistician.  Reproductive health. Screening  You may have the following tests or measurements:  Height, weight, and BMI.  Blood pressure.  Lipid and cholesterol levels. These may be checked every 5 years, or more frequently if you are over 35 years old.  Skin check.  Lung cancer screening. You may have this screening every year starting at age 52 if you have a 30-pack-year history of smoking and currently smoke or have quit within the past 15 years.  Fecal occult blood test (FOBT) of the stool. You may have this test every year starting at age 55.  Flexible sigmoidoscopy or colonoscopy. You may have a sigmoidoscopy every 5 years or a  colonoscopy every 10 years starting at age 62.  Hepatitis C blood test.  Hepatitis B blood test.  Sexually  transmitted disease (STD) testing.  Diabetes screening. This is done by checking your blood sugar (glucose) after you have not eaten for a while (fasting). You may have this done every 1-3 years.  Bone density scan. This is done to screen for osteoporosis. You may have this done starting at age 25.  Mammogram. This may be done every 1-2 years. Talk to your health care provider about how often you should have regular mammograms. Talk with your health care provider about your test results, treatment options, and if necessary, the need for more tests. Vaccines  Your health care provider may recommend certain vaccines, such as:  Influenza vaccine. This is recommended every year.  Tetanus, diphtheria, and acellular pertussis (Tdap, Td) vaccine. You may need a Td booster every 10 years.  Zoster vaccine. You may need this after age 35.  Pneumococcal 13-valent conjugate (PCV13) vaccine. One dose is recommended after age 68.  Pneumococcal polysaccharide (PPSV23) vaccine. One dose is recommended after age 71. Talk to your health care provider about which screenings and vaccines you need and how often you need them. This information is not intended to replace advice given to you by your health care provider. Make sure you discuss any questions you have with your health care provider. Document Released: 11/16/2015 Document Revised: 07/09/2016 Document Reviewed: 08/21/2015 Elsevier Interactive Patient Education  2017 [place] Prevention in the Home Falls can cause injuries. They can happen to people of all ages. There are many things you can do to make your home safe and to help prevent falls. What can I do on the outside of my home?  Regularly fix the edges of walkways and driveways and fix any cracks.  Remove anything that might make you trip as you walk through a door, such as a raised step or threshold.  Trim any bushes or trees on the path to your home.  Use bright outdoor  lighting.  Clear any walking paths of anything that might make someone trip, such as rocks or tools.  Regularly check to see if handrails are loose or broken. Make sure that both sides of any steps have handrails.  Any raised decks and porches should have guardrails on the edges.  Have any leaves, snow, or ice cleared regularly.  Use sand or salt on walking paths during winter.  Clean up any spills in your garage right away. This includes oil or grease spills. What can I do in the bathroom?  Use night lights.  Install grab bars by the toilet and in the tub and shower. Do not use towel bars as grab bars.  Use non-skid mats or decals in the tub or shower.  If you need to sit down in the shower, use a plastic, non-slip stool.  Keep the floor dry. Clean up any water that spills on the floor as soon as it happens.  Remove soap buildup in the tub or shower regularly.  Attach bath mats securely with double-sided non-slip rug tape.  Do not have throw rugs and other things on the floor that can make you trip. What can I do in the bedroom?  Use night lights.  Make sure that you have a light by your bed that is easy to reach.  Do not use any sheets or blankets that are too big for your bed. They should not hang down onto the  floor.  Have a firm chair that has side arms. You can use this for support while you get dressed.  Do not have throw rugs and other things on the floor that can make you trip. What can I do in the kitchen?  Clean up any spills right away.  Avoid walking on wet floors.  Keep items that you use a lot in easy-to-reach places.  If you need to reach something above you, use a strong step stool that has a grab bar.  Keep electrical cords out of the way.  Do not use floor polish or wax that makes floors slippery. If you must use wax, use non-skid floor wax.  Do not have throw rugs and other things on the floor that can make you trip. What can I do with my  stairs?  Do not leave any items on the stairs.  Make sure that there are handrails on both sides of the stairs and use them. Fix handrails that are broken or loose. Make sure that handrails are as long as the stairways.  Check any carpeting to make sure that it is firmly attached to the stairs. Fix any carpet that is loose or worn.  Avoid having throw rugs at the top or bottom of the stairs. If you do have throw rugs, attach them to the floor with carpet tape.  Make sure that you have a light switch at the top of the stairs and the bottom of the stairs. If you do not have them, ask someone to add them for you. What else can I do to help prevent falls?  Wear shoes that:  Do not have high heels.  Have rubber bottoms.  Are comfortable and fit you well.  Are closed at the toe. Do not wear sandals.  If you use a stepladder:  Make sure that it is fully opened. Do not climb a closed stepladder.  Make sure that both sides of the stepladder are locked into place.  Ask someone to hold it for you, if possible.  Clearly mark and make sure that you can see:  Any grab bars or handrails.  First and last steps.  Where the edge of each step is.  Use tools that help you move around (mobility aids) if they are needed. These include:  Canes.  Walkers.  Scooters.  Crutches.  Turn on the lights when you go into a dark area. Replace any light bulbs as soon as they burn out.  Set up your furniture so you have a clear path. Avoid moving your furniture around.  If any of your floors are uneven, fix them.  If there are any pets around you, be aware of where they are.  Review your medicines with your doctor. Some medicines can make you feel dizzy. This can increase your chance of falling. Ask your doctor what other things that you can do to help prevent falls. This information is not intended to replace advice given to you by your health care provider. Make sure you discuss any  questions you have with your health care provider. Document Released: 08/16/2009 Document Revised: 03/27/2016 Document Reviewed: 11/24/2014 Elsevier Interactive Patient Education  2017 [employer].

## 2020-07-20 NOTE — Telephone Encounter (Signed)
Message left to return call to Triage Nurse at [phone].   BMD order filled out and taken to Dr. Elza Rafter desk for review and signature.

## 2020-07-20 NOTE — Progress Notes (Signed)
Subjective:   Maria Montgomery is a 74 y.o. female who presents for Medicare Annual (Subsequent) preventive examination.  Review of Systems    No ROS. Medicare Wellness Visit Cardiac Risk Factors include: advanced age (>67men, >51 women);dyslipidemia;family history of premature cardiovascular disease     Objective:    Today's Vitals   07/20/20 1104  BP: 120/70  Pulse: 74  Resp: 16  Temp: 98.3 F (36.8 C)  SpO2: 98%  Weight: 134 lb 12.8 oz (61.1 kg)  Height: 5' (1.524 m)  PainSc: 0-No pain   Body mass index is 26.33 kg/m.  Advanced Directives 07/20/2020 04/25/2017 04/21/2016  Does Patient Have a Medical Advance Directive? Yes Yes Yes  Type of Paramedic of [place];Living will [place];Living will Living will;Healthcare Power of Attorney  Does patient want to make changes to medical advance directive? No - Patient declined No - Patient declined No - Patient declined  Copy of [place] in Chart? No - copy requested Yes Yes    Current Medications (verified) Outpatient Encounter Medications as of 07/20/2020  Medication Sig  . azelastine (OPTIVAR) 0.05 % ophthalmic solution Place 1 drop into both eyes 2 (two) times daily.  . Azelastine HCl (ASTEPRO) 0.15 % SOLN by Nasal route as needed.    . Bepotastine Besilate (BEPREVE) 1.5 % SOLN Place 1 drop into both eyes daily as needed.  Marland Kitchen BIOTIN PO Take 10,000 mg by mouth.  . Cetirizine HCl (ZYRTEC ALLERGY) 10 MG CAPS Take by mouth as needed. Reported on 11/21/2015  . Cholecalciferol (VITAMIN D PO) Take 5,000 Int'l Units by mouth.  . estradiol (ESTRACE) 0.1 MG/GM vaginal cream Use 1/2 g vaginally twice weekly  . fluticasone (FLONASE) 50 MCG/ACT nasal spray SHAKE LQ AND U 1 TO 2 SPRAYS IEN QD  . levothyroxine (SYNTHROID) 50 MCG tablet TAKE 1 TABLET(50 MCG) BY MOUTH DAILY BEFORE BREAKFAST  . mometasone (ELOCON) 0.1 % cream Apply 1 application topically 2 (two) times daily.  .  montelukast (SINGULAIR) 10 MG tablet Take 10 mg by mouth at bedtime as needed.  . rosuvastatin (CRESTOR) 5 MG tablet Take 1 tablet (5 mg total) by mouth daily.  Marland Kitchen zolpidem (AMBIEN) 5 MG tablet One po qHS prn insomnia   No facility-administered encounter medications on file as of 07/20/2020.    Allergies (verified) Morphine and related   History: Past Medical History:  Diagnosis Date  . Allergic rhinitis   . Blood transfusion    after tonsillectomy as child; and after hysterectomy  . Diverticulosis 2003  . Hypothyroidism 12/2006  . Vitamin D deficiency    Past Surgical History:  Procedure Laterality Date  . ABDOMINAL HYSTERECTOMY    . BLEPHAROPLASTY    . BUNIONECTOMY     hammertoe right '01, hammertoe left '06  . COLONOSCOPY  06/15/2012  . TONSILLECTOMY    . TUBAL LIGATION    . VAGINAL HYSTERECTOMY  1988   had prolapse and blood transfusion   Family History  Problem Relation Age of Onset  . Lung cancer Father        age 61  . Coronary artery disease Mother   . Breast cancer Mother   . Thyroid disease Mother   . Hypertension Mother   . Osteoporosis Mother   . Diabetes Paternal Uncle   . Asthma Brother   . Epilepsy Brother   . Diabetes Maternal Grandmother   . Multiple births Paternal Grandmother   . Colon cancer Neg  Hx   . Stomach cancer Neg Hx    Social History   Socioeconomic History  . Marital status: Married    Spouse name: Not on file  . Number of children: Not on file  . Years of education: Not on file  . Highest education level: Not on file  Occupational History  . Not on file  Tobacco Use  . Smoking status: Never Smoker  . Smokeless tobacco: Never Used  Substance and Sexual Activity  . Alcohol use: Yes    Alcohol/week: 7.0 standard drinks    Types: 7 Shots of liquor per week  . Drug use: No  . Sexual activity: Yes    Birth control/protection: Surgical    Comment: hysterectomy  Other Topics Concern  . Not on file  Social History Narrative    College grad- elementary education.  Married '67  Son-'69; Daughter- '71; 5 grandchildren. Very active in the community. Marriage is in good health   Social Determinants of Health   Financial Resource Strain: Low Risk   . Difficulty of Paying Living Expenses: Not hard at all  Food Insecurity: No Food Insecurity  . Worried About Charity fundraiser in the Last Year: Never true  . Ran Out of Food in the Last Year: Never true  Transportation Needs: No Transportation Needs  . Lack of Transportation (Medical): No  . Lack of Transportation (Non-Medical): No  Physical Activity: Sufficiently Active  . Days of Exercise per Week: 5 days  . Minutes of Exercise per Session: 60 min  Stress: No Stress Concern Present  . Feeling of Stress : Not at all  Social Connections: Socially Integrated  . Frequency of Communication with Friends and Family: More than three times a week  . Frequency of Social Gatherings with Friends and Family: More than three times a week  . Attends Religious Services: More than 4 times per year  . Active Member of Clubs or Organizations: Yes  . Attends Archivist Meetings: More than 4 times per year  . Marital Status: Married    Tobacco Counseling Counseling given: Not Answered   Clinical Intake:  Pre-visit preparation completed: Yes  Pain : No/denies pain Pain Score: 0-No pain     Nutritional Risks: None Diabetes: No  How often do you need to have someone help you when you read instructions, pamphlets, or other written materials from your doctor or pharmacy?: 1 - Never What is the last grade level you completed in school?: [place] - Elementary Education  Diabetic? no  Interpreter Needed?: No  Information entered by :: [employer]. Tarance Balan, LPN   Activities of Daily Living In your present state of health, do you have any difficulty performing the following activities: 07/20/2020  Hearing? N  Vision? N  Difficulty concentrating or making  decisions? N  Walking or climbing stairs? N  Dressing or bathing? N  Doing errands, shopping? N  Preparing Food and eating ? N  Using the Toilet? N  In the past six months, have you accidently leaked urine? N  Do you have problems with loss of bowel control? N  Managing your Medications? N  Managing your Finances? N  Housekeeping or managing your Housekeeping? N  Some recent data might be hidden    Patient Care Team: Janith Lima, MD as PCP - General (Internal Medicine) Romine, Lubertha South, MD (Obstetrics and Gynecology) Wallene Huh, DPM (Podiatry) Danella Sensing, MD (Dermatology) Tiajuana Amass, MD (Gynecology) Lafayette Dragon, MD (Inactive)  as Consulting Physician (Gastroenterology) Charlton Haws, [facility] as Pharmacist (Pharmacist) Nunzio Cobbs, MD as Consulting Physician (Obstetrics and Gynecology)  Indicate any recent Medical Services you may have received from other than [facility] providers in the past year (date may be approximate).     Assessment:   This is a routine wellness examination for [facility].  Hearing/Vision screen No exam data present  Dietary issues and exercise activities discussed: Current Exercise Habits: Home exercise routine;Structured exercise class (Water Aerobics 3 days a week, Personal Trainer 2 days a week and walking in the neighborhood everyday), Type of exercise: strength training/weights;stretching;treadmill;walking;Other - see comments (water aerobics; personal trainer (weights/strength training)), Time (Minutes): 60, Frequency (Times/Week): 5, Weekly Exercise (Minutes/Week): 300, Intensity: Moderate, Exercise limited by: None identified  Goals    . Pharmacy Care Plan     CARE PLAN ENTRY  Current Barriers:  . Chronic Disease Management support, education, and care coordination needs related to Hyperlipidemia, Hypothyroidism, and Allergies , Insomnia   Hyperlipidemia Lab Results  Component Value Date/Time   LDLCALC 92 05/26/2019  08:43 AM   LDLDIRECT 97.6 02/05/2009 08:39 AM .  Pharmacist Clinical Goal(s): o Over the next 180 days, patient will work with PharmD and providers to maintain LDL goal < 100 . Current regimen:  o No medications . Interventions: o Discussed cholesterol goal and importance of diet and exercise to prevent cardiovascular disease . Patient self care activities - Over the next 180 days, patient will: o Continue low cholesterol diet and exercise routine  Hypothyroidism Lab Results  Component Value Date/Time   TSH 2.41 11/22/2019 09:54 AM   TSH 2.18 05/26/2019 08:43 AM .  Pharmacist Clinical Goal(s) o Over the next 180 days, patient will work with PharmD and providers to optimize therapy . Current regimen:  o Levothyroxine 50 mcg in the morning . Interventions: o Discussed different levothyroxine/Synthroid products are NOT interchangeable o Thyroid levels have been well controlled with generic levothyroxine, there is not benefit to switching to Synthroid . Patient self care activities - Over the next 180 days, patient will: o Continue medication as prescribed  Allergies . Pharmacist Clinical Goal(s) o Over the next 180 days, patient will work with PharmD and providers to reduce costs . Current regimen:  o Bepreve eye drops as needed o Flonase nasal spray as needed o Cetirizine 10 mg as needed . Interventions: o [name] is not on formulary; contacted allergy specialist to switch to Optivo (azelastine) drops for generic copay ($12) . Patient self care activities - Over the next 180 days, patient will: o Follow up with allergist as scheduled  Insomnia . Pharmacist Clinical Goal(s) o Over the next 180 days, patient will work with PharmD and providers to optimize therapy . Current regimen:  o Zolpidem 5 mg at bedtime as needed . Interventions: o Discussed benefits of melatonin and sleep hygiene . Patient self care activities - Over the next 180 days, patient will: o Try melatonin  5-10 mg at bedtime o Avoid bright screens and/or energetic activities within 30 min of bedtime  Medication management . Pharmacist Clinical Goal(s): o Over the next 180 days, patient will work with PharmD and providers to maintain optimal medication adherence . Current pharmacy: [facility] . Interventions o Comprehensive medication review performed. o Continue current medication management strategy . Patient self care activities - Over the next 180 days, patient will: o Focus on medication adherence by fill date o Take medications as prescribed o Report any questions or concerns to PharmD  and/or provider(s)  Initial goal documentation      Depression Screen PHQ 2/9 Scores 07/20/2020 11/22/2019 04/25/2018 04/25/2017 04/21/2017 04/21/2016 04/17/2016  PHQ - 2 Score 0 0 0 0 0 0 0    Fall Risk Fall Risk  07/20/2020 11/22/2019 04/25/2018 04/25/2017 04/21/2017  Falls in the past year? 0 0 No No No  Number falls in past yr: 0 0 - - -  Injury with Fall? 0 0 - - -  Risk for fall due to : No Fall Risks - - - -  Follow up Falls evaluation completed;Education provided Falls evaluation completed - - -    Any stairs in or around the home? Yes  If so, are there any without handrails? Yes  Home free of loose throw rugs in walkways, pet beds, electrical cords, etc? Yes  Adequate lighting in your home to reduce risk of falls? Yes   ASSISTIVE DEVICES UTILIZED TO PREVENT FALLS:  Life alert? No  Use of a cane, walker or w/c? No  Grab bars in the bathroom? Yes  Shower chair or bench in shower? Yes  Elevated toilet seat or a handicapped toilet? No   TIMED UP AND GO:  Was the test performed? No .  Length of time to ambulate 10 feet: 0 sec.   Gait steady and fast without use of assistive device  Cognitive Function:     6CIT Screen 07/20/2020  What Year? 0 points  What month? 0 points  What time? 0 points  Count back from 20 0 points  Months in reverse 0 points  Repeat phrase 0 points  Total  Score 0    Immunizations Immunization History  Administered Date(s) Administered  . Influenza Split 09/10/2011  . Influenza Whole 08/21/2008, 08/09/2009  . Influenza, High Dose Seasonal PF 09/01/2018  . Influenza, Quadrivalent, Recombinant, Inj, Pf 07/19/2019  . Influenza,inj,Quad PF,6+ Mos 07/29/2017  . Influenza-Unspecified 09/03/2013, 08/13/2016  . PFIZER SARS-COV-2 Vaccination 11/19/2019, 12/07/2019, 07/03/2020  . Pneumococcal Conjugate-13 04/12/2014  . Pneumococcal Polysaccharide-23 08/25/2007, 04/17/2016  . Td 02/11/2010  . Tdap 03/02/2020  . Zoster 12/23/2006, 12/29/2006  . Zoster Recombinat (Shingrix) 07/29/2017, 03/07/2018    TDAP status: Up to date Flu Vaccine status: Up to date Pneumococcal vaccine status: Up to date Covid-19 vaccine status: Completed vaccines  Qualifies for Shingles Vaccine? Yes   Zostavax completed Yes   Shingrix Completed?: Yes  Screening Tests Health Maintenance  Topic Date Due  . INFLUENZA VACCINE  06/03/2020  . MAMMOGRAM  04/20/2021  . TETANUS/TDAP  03/02/2030  . DEXA SCAN  Completed  . COVID-19 Vaccine  Completed  . Hepatitis C Screening  Completed  . PNA vac Low Risk Adult  Completed    Health Maintenance  Health Maintenance Due  Topic Date Due  . INFLUENZA VACCINE  06/03/2020    Colorectal cancer screening: Completed 06/16/2012. Repeat every 10 years Mammogram status: Completed 04/20/2020. Repeat every year Bone Density status: Completed 04/23/2015. Results reflect: Bone density results: OSTEOPENIA. Repeat every 5 years. (patient stated that she will schedule for 2021)  Lung Cancer Screening: (Low Dose CT Chest recommended if Age 25-80 years, 30 pack-year currently smoking OR have quit w/in 15years.) does not qualify.   Lung Cancer Screening Referral: no  Additional Screening:  Hepatitis C Screening: does qualify; Completed: yes  Vision Screening: Recommended annual ophthalmology exams for early detection of glaucoma and  other disorders of the eye. Is the patient up to date with their annual eye exam?  Yes  Who is  the provider or what is the name of the office in which the patient attends annual eye exams? Rutherford Guys, MD If pt is not established with a provider, would they like to be referred to a provider to establish care? No .   Dental Screening: Recommended annual dental exams for proper oral hygiene  Community Resource Referral / Chronic Care Management: CRR required this visit?  No   CCM required this visit?  No      Plan:     I have personally reviewed and noted the following in the patient's chart:   . Medical and social history . Use of alcohol, tobacco or illicit drugs  . Current medications and supplements . Functional ability and status . Nutritional status . Physical activity . Advanced directives . List of other physicians . Hospitalizations, surgeries, and ER visits in previous 12 months . Vitals . Screenings to include cognitive, depression, and falls . Referrals and appointments  In addition, I have reviewed and discussed with patient certain preventive protocols, quality metrics, and best practice recommendations. A written personalized care plan for preventive services as well as general preventive health recommendations were provided to patient.     Sheral Flow, LPN   [id number]   Nurse Notes: n/a

## 2020-07-23 NOTE — Telephone Encounter (Signed)
BMD order faxed to [facility].   Patient notified. Patient will contact [facility] directly to schedule.   Encounter closed.

## 2020-07-26 NOTE — Progress Notes (Signed)
Subjective:    Patient ID: Maria Montgomery, female    DOB: June 22, 1946, 74 y.o.   MRN: [id number]  HPI The patient is here for an acute visit.   Cholesterol issues: She has been taking the Crestor 5 mg daily and is concerned it may be causing some muscle aches.  She wonders if she really needs to take the medication or not.   Medications and allergies reviewed with patient and updated if appropriate.  Patient Active Problem List   Diagnosis Date Noted  . Acute cystitis without hematuria 07/05/2020  . Vitamin D deficiency 11/22/2019  . Thinning hair 09/16/2018  . Hyperlipidemia LDL goal <100 04/22/2018  . Primary insomnia 04/21/2017  . Osteopenia, senile 04/17/2015  . Routine health maintenance 03/12/2012  . Hypothyroid 03/10/2012  . Overweight(278.02) 02/09/2009    Current Outpatient Medications on File Prior to Visit  Medication Sig Dispense Refill  . azelastine (OPTIVAR) 0.05 % ophthalmic solution Place 1 drop into both eyes 2 (two) times daily. 6 mL 5  . Azelastine HCl (ASTEPRO) 0.15 % SOLN by Nasal route as needed.      . Bepotastine Besilate (BEPREVE) 1.5 % SOLN Place 1 drop into both eyes daily as needed.    Marland Kitchen BIOTIN PO Take 10,000 mg by mouth.    . Cetirizine HCl (ZYRTEC ALLERGY) 10 MG CAPS Take by mouth as needed. Reported on 11/21/2015    . Cholecalciferol (VITAMIN D PO) Take 5,000 Int'l Units by mouth.    . EPINEPHrine 0.3 mg/0.3 mL IJ SOAJ injection SMARTSIG:1 Pre-Filled Pen Syringe IM Once PRN    . estradiol (ESTRACE) 0.1 MG/GM vaginal cream Use 1/2 g vaginally twice weekly 42.5 g 3  . fluticasone (FLONASE) 50 MCG/ACT nasal spray SHAKE LQ AND U 1 TO 2 SPRAYS IEN QD    . levothyroxine (SYNTHROID) 50 MCG tablet TAKE 1 TABLET(50 MCG) BY MOUTH DAILY BEFORE BREAKFAST 90 tablet 0  . mometasone (ELOCON) 0.1 % cream Apply 1 application topically 2 (two) times daily. 45 g 0  . montelukast (SINGULAIR) 10 MG tablet Take 10 mg by mouth at bedtime as needed.    . rosuvastatin  (CRESTOR) 5 MG tablet Take 1 tablet (5 mg total) by mouth daily. 90 tablet 1  . zolpidem (AMBIEN) 5 MG tablet One po qHS prn insomnia 30 tablet 3   No current facility-administered medications on file prior to visit.    Past Medical History:  Diagnosis Date  . Allergic rhinitis   . Blood transfusion    after tonsillectomy as child; and after hysterectomy  . Diverticulosis 2003  . Hypothyroidism 12/2006  . Vitamin D deficiency     Past Surgical History:  Procedure Laterality Date  . ABDOMINAL HYSTERECTOMY    . BLEPHAROPLASTY    . BUNIONECTOMY     hammertoe right '01, hammertoe left '06  . COLONOSCOPY  06/15/2012  . TONSILLECTOMY    . TUBAL LIGATION    . VAGINAL HYSTERECTOMY  1988   had prolapse and blood transfusion    Social History   Socioeconomic History  . Marital status: Married    Spouse name: Not on file  . Number of children: Not on file  . Years of education: Not on file  . Highest education level: Not on file  Occupational History  . Not on file  Tobacco Use  . Smoking status: Never Smoker  . Smokeless tobacco: Never Used  Substance and Sexual Activity  . Alcohol use: Yes  Alcohol/week: 7.0 standard drinks    Types: 7 Shots of liquor per week  . Drug use: No  . Sexual activity: Yes    Birth control/protection: Surgical    Comment: hysterectomy  Other Topics Concern  . Not on file  Social History Narrative   College grad- elementary education.  Married '67  Son-'69; Daughter- '71; 5 grandchildren. Very active in the community. Marriage is in good health   Social Determinants of Health   Financial Resource Strain: Low Risk   . Difficulty of Paying Living Expenses: Not hard at all  Food Insecurity: No Food Insecurity  . Worried About Charity fundraiser in the Last Year: Never true  . Ran Out of Food in the Last Year: Never true  Transportation Needs: No Transportation Needs  . Lack of Transportation (Medical): No  . Lack of Transportation  (Non-Medical): No  Physical Activity: Sufficiently Active  . Days of Exercise per Week: 5 days  . Minutes of Exercise per Session: 60 min  Stress: No Stress Concern Present  . Feeling of Stress : Not at all  Social Connections: Socially Integrated  . Frequency of Communication with Friends and Family: More than three times a week  . Frequency of Social Gatherings with Friends and Family: More than three times a week  . Attends Religious Services: More than 4 times per year  . Active Member of Clubs or Organizations: Yes  . Attends Archivist Meetings: More than 4 times per year  . Marital Status: Married    Family History  Problem Relation Age of Onset  . Lung cancer Father        age 101  . Coronary artery disease Mother   . Breast cancer Mother   . Thyroid disease Mother   . Hypertension Mother   . Osteoporosis Mother   . Diabetes Paternal Uncle   . Asthma Brother   . Epilepsy Brother   . Diabetes Maternal Grandmother   . Multiple births Paternal Grandmother   . Colon cancer Neg Hx   . Stomach cancer Neg Hx     Review of Systems     Objective:   Vitals:   07/27/20 0850  BP: 134/80  Pulse: 72  Temp: 98.1 F (36.7 C)  SpO2: 96%   BP Readings from Last 3 Encounters:  07/27/20 134/80  07/20/20 120/70  05/24/20 124/84   Wt Readings from Last 3 Encounters:  07/27/20 136 lb 3.2 oz (61.8 kg)  07/20/20 134 lb 12.8 oz (61.1 kg)  05/24/20 137 lb (62.1 kg)   Body mass index is 26.6 kg/m.   Physical Exam       The 10-year ASCVD risk score Mikey Bussing DC Jr., et al., 2013) is: 19.9%   Values used to calculate the score:     Age: 75 years     Sex: Female     Is Non-Hispanic African American: No     Diabetic: No     Tobacco smoker: No     Systolic Blood Pressure: 761 mmHg     Is BP treated: No     HDL Cholesterol: 87 mg/dL     Total Cholesterol: 217 mg/dL    Assessment & Plan:    Hyperlipidemia: Chronic Taking Crestor 5 mg daily now and has been  allotted for few months. Has had some increased achiness and is not sure if this is related to medication or not Also wonders if she really needs to be on it Reviewed  ASCVD risk Reviewed cholesterol numbers Discussed that she can either discontinue medication for 2 weeks to see if the achiness goes away or decrease to twice weekly and see if that helps and still lowers her cholesterol She will try 1 of those and follow-up with her PCP as scheduled     This visit occurred during the SARS-CoV-2 public health emergency.  Safety protocols were in place, including screening questions prior to the visit, additional usage of staff PPE, and extensive cleaning of exam room while observing appropriate contact time as indicated for disinfecting solutions.

## 2020-07-27 ENCOUNTER — Encounter: Payer: Self-pay | Admitting: Internal Medicine

## 2020-07-27 ENCOUNTER — Ambulatory Visit (INDEPENDENT_AMBULATORY_CARE_PROVIDER_SITE_OTHER): Payer: PPO | Admitting: Internal Medicine

## 2020-07-27 ENCOUNTER — Other Ambulatory Visit: Payer: Self-pay

## 2020-07-27 VITALS — BP 134/80 | HR 72 | Temp 98.1°F | Wt 136.2 lb

## 2020-07-27 DIAGNOSIS — E785 Hyperlipidemia, unspecified: Secondary | ICD-10-CM

## 2020-07-27 NOTE — Patient Instructions (Signed)
Try the crestor only twice a week.  Stop if completely for a couple of week if your aches do not resolve.

## 2020-07-30 DIAGNOSIS — J301 Allergic rhinitis due to pollen: Secondary | ICD-10-CM | POA: Diagnosis not present

## 2020-07-30 DIAGNOSIS — J3089 Other allergic rhinitis: Secondary | ICD-10-CM | POA: Diagnosis not present

## 2020-08-06 DIAGNOSIS — M8589 Other specified disorders of bone density and structure, multiple sites: Secondary | ICD-10-CM | POA: Diagnosis not present

## 2020-08-06 LAB — HM DEXA SCAN
HM Dexa Scan: -1.5
HM Dexa Scan: -1.5

## 2020-08-09 ENCOUNTER — Telehealth: Payer: Self-pay | Admitting: Internal Medicine

## 2020-08-09 NOTE — Telephone Encounter (Signed)
Patient called and said that last her nose started bleeding, then again in the middle of the night, then again around 2:00pm today. She said she has bad allergies. She was wondering if there was anything that she can do/ take for it.   Please call her back at [phone]

## 2020-08-10 NOTE — Telephone Encounter (Signed)
Called pt, LVM with details below  

## 2020-08-13 ENCOUNTER — Other Ambulatory Visit: Payer: Self-pay | Admitting: Internal Medicine

## 2020-08-13 DIAGNOSIS — E039 Hypothyroidism, unspecified: Secondary | ICD-10-CM

## 2020-08-13 DIAGNOSIS — J301 Allergic rhinitis due to pollen: Secondary | ICD-10-CM | POA: Diagnosis not present

## 2020-08-13 DIAGNOSIS — E890 Postprocedural hypothyroidism: Secondary | ICD-10-CM

## 2020-08-13 DIAGNOSIS — J3089 Other allergic rhinitis: Secondary | ICD-10-CM | POA: Diagnosis not present

## 2020-08-18 ENCOUNTER — Ambulatory Visit: Payer: PPO

## 2020-08-22 ENCOUNTER — Telehealth: Payer: Self-pay | Admitting: Obstetrics and Gynecology

## 2020-08-22 NOTE — Telephone Encounter (Signed)
Please contact patient with results of her bone density from [facility] from 08/06/20.    Osteopenia is present in her hips and spine.  This indicates reduced density of her bones, but she does not have osteoporosis.   Her calculated future risk of fracture by a model called FRAX indicates that her risk of fracture is still low.  She has 2.4% risk of hip fracture and 12% risk of major osteoporotic fracture in the next 10 years. Specific prescription bone building medication is not needed at this time.   1200 mg of calcium daily in divided doses, at least 800 IU of vitamin D daily, and weight bearing exercise will help to maintain bone density and strength.  Her next bone density is due in 2 years.

## 2020-08-23 NOTE — Telephone Encounter (Signed)
Spoke with patient. Advised of BMD results as seen below per Dr. Quincy Simmonds. Patient verbalizes understanding and is agreeable.   Patient request to r/s her AEX to OV for breast and pelvic exam, has yearly Wellness Exams with PCP. OV scheduled for 12/31/20 at 2:30pm with Dr. Quincy Simmonds.   Encounter closed.

## 2020-08-27 DIAGNOSIS — J3089 Other allergic rhinitis: Secondary | ICD-10-CM | POA: Diagnosis not present

## 2020-08-27 DIAGNOSIS — J301 Allergic rhinitis due to pollen: Secondary | ICD-10-CM | POA: Diagnosis not present

## 2020-09-10 DIAGNOSIS — J3081 Allergic rhinitis due to animal (cat) (dog) hair and dander: Secondary | ICD-10-CM | POA: Diagnosis not present

## 2020-09-10 DIAGNOSIS — H1045 Other chronic allergic conjunctivitis: Secondary | ICD-10-CM | POA: Diagnosis not present

## 2020-09-10 DIAGNOSIS — J301 Allergic rhinitis due to pollen: Secondary | ICD-10-CM | POA: Diagnosis not present

## 2020-09-10 DIAGNOSIS — J3089 Other allergic rhinitis: Secondary | ICD-10-CM | POA: Diagnosis not present

## 2020-09-10 DIAGNOSIS — R059 Cough, unspecified: Secondary | ICD-10-CM | POA: Diagnosis not present

## 2020-09-18 ENCOUNTER — Encounter: Payer: Self-pay | Admitting: Obstetrics and Gynecology

## 2020-10-01 DIAGNOSIS — J3089 Other allergic rhinitis: Secondary | ICD-10-CM | POA: Diagnosis not present

## 2020-10-01 DIAGNOSIS — J301 Allergic rhinitis due to pollen: Secondary | ICD-10-CM | POA: Diagnosis not present

## 2020-10-05 DIAGNOSIS — J301 Allergic rhinitis due to pollen: Secondary | ICD-10-CM | POA: Diagnosis not present

## 2020-10-10 ENCOUNTER — Telehealth: Payer: PPO

## 2020-10-11 ENCOUNTER — Ambulatory Visit: Payer: PPO | Admitting: Pharmacist

## 2020-10-11 ENCOUNTER — Other Ambulatory Visit: Payer: Self-pay

## 2020-10-11 DIAGNOSIS — M8580 Other specified disorders of bone density and structure, unspecified site: Secondary | ICD-10-CM

## 2020-10-11 DIAGNOSIS — F5101 Primary insomnia: Secondary | ICD-10-CM

## 2020-10-11 DIAGNOSIS — E785 Hyperlipidemia, unspecified: Secondary | ICD-10-CM

## 2020-10-11 NOTE — Chronic Care Management (AMB) (Signed)
Chronic Care Management Pharmacy  Name: KIMMBERLY WISSER  MRN: [id number] DOB: 1945-12-12   Chief Complaint/ HPI  Ronny Bacon,  74 y.o. , female presents for their Follow-Up CCM visit with the clinical pharmacist via telephone due to COVID-19 Pandemic.  PCP : Janith Lima, MD  Their chronic conditions include: Hyperlipidemia, Hypothyroidism and Osteopenia, Allergies, Insomnia  Office Visits: 07/27/20 Dr Quay Burow OV: muscle aches with Crestor, reduce to twice a week 05/24/20 Dr Ronnald Ramp OV: added Livalo 1 mg due to high ASCVD risk. Changed to rosuvastatin 5 mg due to cost. 11/22/19 Dr Ronnald Ramp OV: conditions stable, no med changes.  Consult Visit: Tiajuana Amass (allergy): ~q2 week visits  Allergies  Allergen Reactions  . Morphine And Related Swelling   Medications: Outpatient Encounter Medications as of 10/11/2020  Medication Sig Note  . azelastine (OPTIVAR) 0.05 % ophthalmic solution Place 1 drop into both eyes 2 (two) times daily.   . Azelastine HCl 0.15 % SOLN Place into the nose as needed.   Marland Kitchen BIOTIN PO Take 10,000 mg by mouth.   . Cetirizine HCl 10 MG CAPS Take by mouth as needed. Reported on 11/21/2015   . Cholecalciferol (VITAMIN D PO) Take 5,000 Int'l Units by mouth.   . EPINEPHrine 0.3 mg/0.3 mL IJ SOAJ injection SMARTSIG:1 Pre-Filled Pen Syringe IM Once PRN   . estradiol (ESTRACE) 0.1 MG/GM vaginal cream Use 1/2 g vaginally twice weekly   . fluticasone (FLONASE) 50 MCG/ACT nasal spray SHAKE LQ AND U 1 TO 2 SPRAYS IEN QD   . levothyroxine (SYNTHROID) 50 MCG tablet TAKE 1 TABLET(50 MCG) BY MOUTH DAILY BEFORE BREAKFAST   . mometasone (ELOCON) 0.1 % cream Apply 1 application topically 2 (two) times daily.   . montelukast (SINGULAIR) 10 MG tablet Take 10 mg by mouth at bedtime as needed.   . rosuvastatin (CRESTOR) 5 MG tablet Take 1 tablet (5 mg total) by mouth daily. (Patient taking differently: Take 5 mg by mouth 2 (two) times a week.)   . zolpidem (AMBIEN) 5 MG tablet One po qHS  prn insomnia   . [DISCONTINUED] Bepotastine Besilate (BEPREVE) 1.5 % SOLN Place 1 drop into both eyes daily as needed. 04/11/2020: Not covered by insurance   No facility-administered encounter medications on file as of 10/11/2020.   Wt Readings from Last 3 Encounters:  07/27/20 136 lb 3.2 oz (61.8 kg)  07/20/20 134 lb 12.8 oz (61.1 kg)  05/24/20 137 lb (62.1 kg)   Lab Results  Component Value Date   CREATININE 0.89 05/24/2020   BUN 19 05/24/2020   GFR 61.77 05/26/2019   GFRNONAA 76.26 02/05/2010   GFRAA 81 12/24/2007   NA 138 05/24/2020   K 4.5 05/24/2020   CALCIUM 9.4 05/24/2020   CO2 25 05/24/2020    Current Diagnosis/Assessment:    Goals Addressed            This Visit's Progress   . Pharmacy Care Plan       CARE PLAN ENTRY  Current Barriers:  . Chronic Disease Management support, education, and care coordination needs related to Hyperlipidemia, Insomnia, Osteoporosis   Hyperlipidemia Lab Results  Component Value Date/Time   LDLCALC 92 05/26/2019 08:43 AM   LDLDIRECT 97.6 02/05/2009 08:39 AM .  Pharmacist Clinical Goal(s): o Over the next 180 days, patient will work with PharmD and providers to maintain LDL goal < 100 . Current regimen:  o Rosuvastatin 5 mg twice a week . Interventions: o Discussed cholesterol goals and benefits  of medications for prevention of heart attack / stroke . Patient self care activities - Over the next 180 days, patient will: o Continue low cholesterol diet and exercise routine  Insomnia . Pharmacist Clinical Goal(s) o Over the next 180 days, patient will work with PharmD and providers to optimize therapy . Current regimen:  o Zolpidem 5 mg at bedtime as needed . Interventions: o Discussed benefits of melatonin and sleep hygiene . Patient self care activities - Over the next 180 days, patient will: o Try melatonin 5-10 mg at bedtime o Avoid bright screens and/or energetic activities within 30 min of  bedtime  Osteoporosis . Pharmacist Clinical Goal(s) o Over the next 180 days, patient will work with PharmD and providers to optimize therapy . Current regimen:  o Vitamin D 5000 IU daily . Interventions: o Recommend [phone] units of vitamin D daily. Recommend 1200 mg of calcium daily from dietary and supplemental sources. . Patient self care activities - Over the next 180 days, patient will: o Take calcium and vitamin D supplements as directed  Medication management . Pharmacist Clinical Goal(s): o Over the next 180 days, patient will work with PharmD and providers to maintain optimal medication adherence . Current pharmacy: [facility] . Interventions o Comprehensive medication review performed. o Continue current medication management strategy . Patient self care activities - Over the next 180 days, patient will: o Focus on medication adherence by fill date o Take medications as prescribed o Report any questions or concerns to PharmD and/or provider(s)  Please see past updates related to this goal by clicking on the "Past Updates" button in the selected goal        Hyperlipidemia   LDL goal < 100  Last lipids Lab Results  Component Value Date   CHOL 217 (H) 05/24/2020   HDL 87 05/24/2020   LDLCALC 108 (H) 05/24/2020   LDLDIRECT 97.6 02/05/2009   TRIG 111 05/24/2020   CHOLHDL 2.5 05/24/2020   The 10-year ASCVD risk score Mikey Bussing DC Jr., et al., 2013) is: 19.9%   Values used to calculate the score:     Age: 28 years     Sex: Female     Is Non-Hispanic African American: No     Diabetic: No     Tobacco smoker: No     Systolic Blood Pressure: 035 mmHg     Is BP treated: No     HDL Cholesterol: 87 mg/dL     Total Cholesterol: 217 mg/dL   Patient has failed these meds in past: none Patient is currently controlled on the following medications:  . Rosuvastatin 5 mg twice a week  We discussed:  diet and exercise extensively  Plan  Continue control with diet and  exercise  Insomnia   Patient has failed these meds in past: n/a Patient is currently controlled on the following medications:  Marland Kitchen Zolpidem 5 mg HS . Melatonin 5 mg HS  We discussed:  Patient is satisfied with current regimen and denies issues  Plan  Continue current medications    Osteopenia   Last DEXA Scan: 08/06/2020 (Dr [facility])  T-Score femoral neck: -0.9  10-year probability of major osteoporotic fracture: 12%  10-year probability of hip fracture: 2.4%  Lab Results  Component Value Date/Time   CALCIUM 9.4 05/24/2020 09:27 AM   VD25OH 34 05/24/2020 09:27 AM   VD25OH 48.35 11/22/2019 09:54 AM   Patient is not a candidate for pharmacologic treatment  Patient has failed these meds in past: n/a  Patient is currently controlled on the following medications:   Vitamin D 5000 IU daily   We discussed:  Recommend [phone] units of vitamin D daily. Recommend 1200 mg of calcium daily from dietary and supplemental sources.  Plan  Continue current medications and control with diet and exercise  Recommend to add calcium supplement  Vaccines   Reviewed and discussed patient's vaccination history.    Immunization History  Administered Date(s) Administered  . Influenza Split 09/10/2011  . Influenza Whole 08/21/2008, 08/09/2009  . Influenza, High Dose Seasonal PF 09/01/2018  . Influenza, Quadrivalent, Recombinant, Inj, Pf 07/19/2019  . Influenza,inj,Quad PF,6+ Mos 07/29/2017  . Influenza-Unspecified 09/03/2013, 08/13/2016  . PFIZER SARS-COV-2 Vaccination 11/19/2019, 12/07/2019, 07/03/2020  . Pneumococcal Conjugate-13 04/12/2014  . Pneumococcal Polysaccharide-23 08/25/2007, 04/17/2016  . Td 02/11/2010  . Tdap 03/02/2020  . Zoster 12/23/2006, 12/29/2006  . Zoster Recombinat (Shingrix) 07/29/2017, 03/07/2018    Plan  Vaccines up to date  Medication Management   Pt uses [place] for all medications Uses pill box? No - only 2 rx meds Pt endorses 100%  compliance  We discussed: [place] a [place] and travelling to visit family; [facility] is helpful with transferring rx's when needed.  Plan  Continue current medication management strategy    Follow up: 6 month phone visit  Charlene Brooke, PharmD, [facility] Clinical Pharmacist [facility] [phone]

## 2020-10-11 NOTE — Patient Instructions (Signed)
Visit Information  Phone number for Pharmacist: [phone]  Goals Addressed            This Visit's Progress   . Pharmacy Care Plan       CARE PLAN ENTRY  Current Barriers:  . Chronic Disease Management support, education, and care coordination needs related to Hyperlipidemia, Insomnia, Osteoporosis   Hyperlipidemia Lab Results  Component Value Date/Time   LDLCALC 92 05/26/2019 08:43 AM   LDLDIRECT 97.6 02/05/2009 08:39 AM .  Pharmacist Clinical Goal(s): o Over the next 180 days, patient will work with PharmD and providers to maintain LDL goal < 100 . Current regimen:  o Rosuvastatin 5 mg twice a week . Interventions: o Discussed cholesterol goals and benefits of medications for prevention of heart attack / stroke . Patient self care activities - Over the next 180 days, patient will: o Continue low cholesterol diet and exercise routine  Insomnia . Pharmacist Clinical Goal(s) o Over the next 180 days, patient will work with PharmD and providers to optimize therapy . Current regimen:  o Zolpidem 5 mg at bedtime as needed . Interventions: o Discussed benefits of melatonin and sleep hygiene . Patient self care activities - Over the next 180 days, patient will: o Try melatonin 5-10 mg at bedtime o Avoid bright screens and/or energetic activities within 30 min of bedtime  Osteoporosis . Pharmacist Clinical Goal(s) o Over the next 180 days, patient will work with PharmD and providers to optimize therapy . Current regimen:  o Vitamin D 5000 IU daily . Interventions: o Recommend [phone] units of vitamin D daily. Recommend 1200 mg of calcium daily from dietary and supplemental sources. . Patient self care activities - Over the next 180 days, patient will: o Take calcium and vitamin D supplements as directed  Medication management . Pharmacist Clinical Goal(s): o Over the next 180 days, patient will work with PharmD and providers to maintain optimal medication  adherence . Current pharmacy: [facility] . Interventions o Comprehensive medication review performed. o Continue current medication management strategy . Patient self care activities - Over the next 180 days, patient will: o Focus on medication adherence by fill date o Take medications as prescribed o Report any questions or concerns to PharmD and/or provider(s)  Please see past updates related to this goal by clicking on the "Past Updates" button in the selected goal       The patient verbalized understanding of instructions, educational materials, and care plan provided today and declined offer to receive copy of patient instructions, educational materials, and care plan.  Telephone follow up appointment with pharmacy team member scheduled for: 6 months  Charlene Brooke, PharmD, [facility] Clinical Pharmacist [facility] [phone]

## 2020-10-17 DIAGNOSIS — J3089 Other allergic rhinitis: Secondary | ICD-10-CM | POA: Diagnosis not present

## 2020-10-17 DIAGNOSIS — J301 Allergic rhinitis due to pollen: Secondary | ICD-10-CM | POA: Diagnosis not present

## 2020-10-22 DIAGNOSIS — J301 Allergic rhinitis due to pollen: Secondary | ICD-10-CM | POA: Diagnosis not present

## 2020-10-22 DIAGNOSIS — J3089 Other allergic rhinitis: Secondary | ICD-10-CM | POA: Diagnosis not present

## 2020-10-22 DIAGNOSIS — J3081 Allergic rhinitis due to animal (cat) (dog) hair and dander: Secondary | ICD-10-CM | POA: Diagnosis not present

## 2020-11-07 ENCOUNTER — Other Ambulatory Visit: Payer: Self-pay | Admitting: Internal Medicine

## 2020-11-07 DIAGNOSIS — E890 Postprocedural hypothyroidism: Secondary | ICD-10-CM

## 2020-11-07 DIAGNOSIS — J3089 Other allergic rhinitis: Secondary | ICD-10-CM | POA: Diagnosis not present

## 2020-11-07 DIAGNOSIS — J301 Allergic rhinitis due to pollen: Secondary | ICD-10-CM | POA: Diagnosis not present

## 2020-11-07 DIAGNOSIS — E039 Hypothyroidism, unspecified: Secondary | ICD-10-CM

## 2020-11-09 DIAGNOSIS — J3089 Other allergic rhinitis: Secondary | ICD-10-CM | POA: Diagnosis not present

## 2020-11-09 DIAGNOSIS — J301 Allergic rhinitis due to pollen: Secondary | ICD-10-CM | POA: Diagnosis not present

## 2020-11-11 ENCOUNTER — Other Ambulatory Visit: Payer: Self-pay | Admitting: Internal Medicine

## 2020-11-11 DIAGNOSIS — E039 Hypothyroidism, unspecified: Secondary | ICD-10-CM

## 2020-11-11 DIAGNOSIS — E890 Postprocedural hypothyroidism: Secondary | ICD-10-CM

## 2020-11-12 DIAGNOSIS — J301 Allergic rhinitis due to pollen: Secondary | ICD-10-CM | POA: Diagnosis not present

## 2020-11-12 DIAGNOSIS — J3089 Other allergic rhinitis: Secondary | ICD-10-CM | POA: Diagnosis not present

## 2020-11-12 DIAGNOSIS — H1045 Other chronic allergic conjunctivitis: Secondary | ICD-10-CM | POA: Diagnosis not present

## 2020-11-12 DIAGNOSIS — L309 Dermatitis, unspecified: Secondary | ICD-10-CM | POA: Diagnosis not present

## 2020-11-21 DIAGNOSIS — J3089 Other allergic rhinitis: Secondary | ICD-10-CM | POA: Diagnosis not present

## 2020-11-21 DIAGNOSIS — J301 Allergic rhinitis due to pollen: Secondary | ICD-10-CM | POA: Diagnosis not present

## 2020-11-24 ENCOUNTER — Other Ambulatory Visit: Payer: Self-pay | Admitting: Internal Medicine

## 2020-11-24 DIAGNOSIS — F5101 Primary insomnia: Secondary | ICD-10-CM

## 2020-11-26 ENCOUNTER — Telehealth: Payer: Self-pay | Admitting: *Deleted

## 2020-11-26 ENCOUNTER — Encounter: Payer: Self-pay | Admitting: Internal Medicine

## 2020-11-26 ENCOUNTER — Other Ambulatory Visit: Payer: Self-pay

## 2020-11-26 ENCOUNTER — Ambulatory Visit (INDEPENDENT_AMBULATORY_CARE_PROVIDER_SITE_OTHER): Payer: PPO | Admitting: Internal Medicine

## 2020-11-26 VITALS — BP 140/84 | HR 65 | Temp 98.0°F | Resp 16 | Ht 60.0 in | Wt 139.0 lb

## 2020-11-26 DIAGNOSIS — E890 Postprocedural hypothyroidism: Secondary | ICD-10-CM

## 2020-11-26 DIAGNOSIS — E039 Hypothyroidism, unspecified: Secondary | ICD-10-CM

## 2020-11-26 LAB — TSH: TSH: 1.89 u[IU]/mL (ref 0.35–4.50)

## 2020-11-26 MED ORDER — LEVOTHYROXINE SODIUM 50 MCG PO TABS: 50.0000 ug | ORAL_TABLET | Freq: Every day | ORAL | 1 refills | Status: DC

## 2020-11-26 NOTE — Patient Instructions (Signed)
Hypothyroidism  Hypothyroidism is when the thyroid gland does not make enough of certain hormones (it is underactive). The thyroid gland is a small gland located in the lower front part of the neck, just in front of the windpipe (trachea). This gland makes hormones that help control how the body uses food for energy (metabolism) as well as how the heart and brain function. These hormones also play a role in keeping your bones strong. When the thyroid is underactive, it produces too little of the hormones thyroxine (T4) and triiodothyronine (T3). What are the causes? This condition may be caused by:  Hashimoto's disease. This is a disease in which the body's disease-fighting system (immune system) attacks the thyroid gland. This is the most common cause.  Viral infections.  Pregnancy.  Certain medicines.  Birth defects.  Past radiation treatments to the head or neck for cancer.  Past treatment with radioactive iodine.  Past exposure to radiation in the environment.  Past surgical removal of part or all of the thyroid.  Problems with a gland in the center of the brain (pituitary gland).  Lack of enough iodine in the diet. What increases the risk? You are more likely to develop this condition if:  You are female.  You have a family history of thyroid conditions.  You use a medicine called lithium.  You take medicines that affect the immune system (immunosuppressants). What are the signs or symptoms? Symptoms of this condition include:  Feeling as though you have no energy (lethargy).  Not being able to tolerate cold.  Weight gain that is not explained by a change in diet or exercise habits.  Lack of appetite.  Dry skin.  Coarse hair.  Menstrual irregularity.  Slowing of thought processes.  Constipation.  Sadness or depression. How is this diagnosed? This condition may be diagnosed based on:  Your symptoms, your medical history, and a physical exam.  Blood  tests. You may also have imaging tests, such as an ultrasound or MRI. How is this treated? This condition is treated with medicine that replaces the thyroid hormones that your body does not make. After you begin treatment, it may take several weeks for symptoms to go away. Follow these instructions at home:  Take over-the-counter and prescription medicines only as told by your health care provider.  If you start taking any new medicines, tell your health care provider.  Keep all follow-up visits as told by your health care provider. This is important. ? As your condition improves, your dosage of thyroid hormone medicine may change. ? You will need to have blood tests regularly so that your health care provider can monitor your condition. Contact a health care provider if:  Your symptoms do not get better with treatment.  You are taking thyroid hormone replacement medicine and you: ? Sweat a lot. ? Have tremors. ? Feel anxious. ? Lose weight rapidly. ? Cannot tolerate heat. ? Have emotional swings. ? Have diarrhea. ? Feel weak. Get help right away if you have:  Chest pain.  An irregular heartbeat.  A rapid heartbeat.  Difficulty breathing. Summary  Hypothyroidism is when the thyroid gland does not make enough of certain hormones (it is underactive).  When the thyroid is underactive, it produces too little of the hormones thyroxine (T4) and triiodothyronine (T3).  The most common cause is Hashimoto's disease, a disease in which the body's disease-fighting system (immune system) attacks the thyroid gland. The condition can also be caused by viral infections, medicine, pregnancy, or   past radiation treatment to the head or neck.  Symptoms may include weight gain, dry skin, constipation, feeling as though you do not have energy, and not being able to tolerate cold.  This condition is treated with medicine to replace the thyroid hormones that your body does not make. This  information is not intended to replace advice given to you by your health care provider. Make sure you discuss any questions you have with your health care provider. Document Revised: 07/20/2020 Document Reviewed: 07/05/2020 Elsevier Patient Education  2021 Elsevier Inc.  

## 2020-11-26 NOTE — Telephone Encounter (Signed)
Patient has her annual exam scheduled on 12/31/20 with you, last annual exam was 05/2019 with Maria Montgomery. She asked if refill for estrace cream could be sent until her annual exam next month?

## 2020-11-26 NOTE — Progress Notes (Signed)
Subjective:  Patient ID: Maria Montgomery, female    DOB: 05/24/1946  Age: 75 y.o. MRN: [id number]  CC: Hypothyroidism  This visit occurred during the SARS-CoV-2 public health emergency.  Safety protocols were in place, including screening questions prior to the visit, additional usage of staff PPE, and extensive cleaning of exam room while observing appropriate contact time as indicated for disinfecting solutions.    HPI Maria Montgomery presents for f/up - She feels well, no complaints.  Outpatient Medications Prior to Visit  Medication Sig Dispense Refill   azelastine (OPTIVAR) 0.05 % ophthalmic solution Place 1 drop into both eyes 2 (two) times daily. 6 mL 5   Azelastine HCl 0.15 % SOLN Place into the nose as needed.     BIOTIN PO Take 10,000 mg by mouth.     Cetirizine HCl 10 MG CAPS Take by mouth as needed. Reported on 11/21/2015     Cholecalciferol (VITAMIN D PO) Take 5,000 Int'l Units by mouth.     EPINEPHrine 0.3 mg/0.3 mL IJ SOAJ injection SMARTSIG:1 Pre-Filled Pen Syringe IM Once PRN     estradiol (ESTRACE) 0.1 MG/GM vaginal cream Use 1/2 g vaginally twice weekly 42.5 g 3   fluticasone (FLONASE) 50 MCG/ACT nasal spray SHAKE LQ AND U 1 TO 2 SPRAYS IEN QD     mometasone (ELOCON) 0.1 % cream Apply 1 application topically 2 (two) times daily. 45 g 0   montelukast (SINGULAIR) 10 MG tablet Take 10 mg by mouth at bedtime as needed.     rosuvastatin (CRESTOR) 5 MG tablet Take 1 tablet (5 mg total) by mouth daily. (Patient taking differently: Take 5 mg by mouth 2 (two) times a week.) 90 tablet 1   zolpidem (AMBIEN) 5 MG tablet TAKE 1 TABLET BY MOUTH EVERY NIGHT AT BEDTIME AS NEEDED FOR INSOMNIA 30 tablet 3   levothyroxine (SYNTHROID) 50 MCG tablet TAKE 1 TABLET(50 MCG) BY MOUTH DAILY BEFORE BREAKFAST 90 tablet 0   No facility-administered medications prior to visit.    ROS Review of Systems  Constitutional: Negative for appetite change, diaphoresis, fatigue and unexpected  weight change.  HENT: Negative.   Eyes: Negative for visual disturbance.  Respiratory: Negative for cough, chest tightness, shortness of breath and wheezing.   Cardiovascular: Negative for chest pain, palpitations and leg swelling.  Gastrointestinal: Negative for abdominal pain, constipation, diarrhea, nausea and vomiting.  Endocrine: Negative.  Negative for cold intolerance and heat intolerance.  Genitourinary: Negative.  Negative for difficulty urinating.  Musculoskeletal: Negative for arthralgias and myalgias.  Skin: Negative.   Neurological: Negative.  Negative for dizziness, weakness, light-headedness and numbness.  Hematological: Negative for adenopathy. Does not bruise/bleed easily.  Psychiatric/Behavioral: Negative.     Objective:  BP 140/84    Pulse 65    Temp 98 F (36.7 C) (Oral)    Resp 16    Ht 5' (1.524 m)    Wt 139 lb (63 kg)    LMP 07/28/1987    SpO2 99%    BMI 27.15 kg/m   BP Readings from Last 3 Encounters:  11/26/20 140/84  07/27/20 134/80  07/20/20 120/70    Wt Readings from Last 3 Encounters:  11/26/20 139 lb (63 kg)  07/27/20 136 lb 3.2 oz (61.8 kg)  07/20/20 134 lb 12.8 oz (61.1 kg)    Physical Exam Vitals reviewed.  Constitutional:      Appearance: Normal appearance.  HENT:     Nose: Nose normal.     Mouth/Throat:  Mouth: Mucous membranes are moist.  Eyes:     General: No scleral icterus.    Conjunctiva/sclera: Conjunctivae normal.  Cardiovascular:     Rate and Rhythm: Normal rate and regular rhythm.     Heart sounds: No murmur heard.   Pulmonary:     Effort: Pulmonary effort is normal.     Breath sounds: No stridor. No wheezing, rhonchi or rales.  Abdominal:     General: Abdomen is flat. Bowel sounds are normal. There is no distension.     Palpations: Abdomen is soft. There is no fluid wave, hepatomegaly, splenomegaly or mass.  Musculoskeletal:        General: Normal range of motion.     Cervical back: Neck supple.     Right lower  leg: No edema.     Left lower leg: No edema.  Lymphadenopathy:     Cervical: No cervical adenopathy.  Skin:    General: Skin is warm and dry.     Coloration: Skin is not pale.  Neurological:     General: No focal deficit present.     Mental Status: She is alert.  Psychiatric:        Mood and Affect: Mood normal.        Behavior: Behavior normal.     Lab Results  Component Value Date   WBC 9.3 05/24/2020   HGB 14.6 05/24/2020   HCT 43.5 05/24/2020   PLT 262 05/24/2020   GLUCOSE 92 05/24/2020   CHOL 217 (H) 05/24/2020   TRIG 111 05/24/2020   HDL 87 05/24/2020   LDLDIRECT 97.6 02/05/2009   LDLCALC 108 (H) 05/24/2020   ALT 17 05/24/2020   AST 21 05/24/2020   NA 138 05/24/2020   K 4.5 05/24/2020   CL 103 05/24/2020   CREATININE 0.89 05/24/2020   BUN 19 05/24/2020   CO2 25 05/24/2020   TSH 1.89 11/26/2020    DG Finger Ring Left  Result Date: 04/22/2018 CLINICAL DATA:  Injured the fourth finger 3 weeks ago with pain and swelling of the PIP joint EXAM: LEFT RING FINGER 2+V COMPARISON:  Left hand films of 05/14/2015 FINDINGS: There may be a tiny nondisplaced avulsion from the distal aspect of the proximal phalanx of the left fourth digit with adjacent soft tissue swelling. No other acute abnormality is seen. Mild degenerative changes present involving the DIP and PIP joints IMPRESSION: 1. Questionable small avulsion fragment from the distal aspect of the proximal phalanx of the left fourth digit with soft tissue swelling. 2. Degenerative change of the DIP joints. Electronically Signed   By: Ivar Drape M.D.   On: 04/22/2018 08:46    Assessment & Plan:   Maria Montgomery was seen today for hypothyroidism.  Diagnoses and all orders for this visit:  Acquired hypothyroidism -     TSH; Future -     TSH -     levothyroxine (SYNTHROID) 50 MCG tablet; Take 1 tablet (50 mcg total) by mouth daily before breakfast.  Postoperative hypothyroidism- Her TSH is in the normal range. She will stay on  the current T4 dosage. -     levothyroxine (SYNTHROID) 50 MCG tablet; Take 1 tablet (50 mcg total) by mouth daily before breakfast.   I have changed Maria C. Borrero "Maria Montgomery"'s levothyroxine. I am also having her maintain her Azelastine HCl, Cetirizine HCl, mometasone, azelastine, BIOTIN PO, Cholecalciferol (VITAMIN D PO), fluticasone, estradiol, montelukast, rosuvastatin, EPINEPHrine, and zolpidem.  Meds ordered this encounter  Medications   levothyroxine (SYNTHROID)  50 MCG tablet    Sig: Take 1 tablet (50 mcg total) by mouth daily before breakfast.    Dispense:  90 tablet    Refill:  1    ZERO refills remain on this prescription. Your patient is requesting advance approval of refills for this medication to [place]     Follow-up: Return in about 6 months (around 05/26/2021).  Scarlette Calico, MD

## 2020-11-27 ENCOUNTER — Other Ambulatory Visit: Payer: Self-pay | Admitting: Obstetrics and Gynecology

## 2020-11-27 DIAGNOSIS — N952 Postmenopausal atrophic vaginitis: Secondary | ICD-10-CM

## 2020-11-27 MED ORDER — ESTRADIOL 0.1 MG/GM VA CREA
TOPICAL_CREAM | VAGINAL | 0 refills | Status: DC
Start: 2020-11-27 — End: 2021-02-20

## 2020-11-27 NOTE — Telephone Encounter (Signed)
Patient informed. 

## 2020-11-27 NOTE — Telephone Encounter (Signed)
I refilled the patient's estrogen so she will not run out prior to her annual exam. Please let her know.

## 2020-11-28 DIAGNOSIS — J3089 Other allergic rhinitis: Secondary | ICD-10-CM | POA: Diagnosis not present

## 2020-11-28 DIAGNOSIS — J301 Allergic rhinitis due to pollen: Secondary | ICD-10-CM | POA: Diagnosis not present

## 2020-12-26 DIAGNOSIS — J3089 Other allergic rhinitis: Secondary | ICD-10-CM | POA: Diagnosis not present

## 2020-12-26 DIAGNOSIS — J301 Allergic rhinitis due to pollen: Secondary | ICD-10-CM | POA: Diagnosis not present

## 2020-12-28 NOTE — Progress Notes (Signed)
75 y.o. G85P2002 Married White or Caucasian female here for breast & pelvic.      Patient's last menstrual period was 07/28/1987.          Sexually active: Yes.    The current method of family planning is status post hysterectomy.    Exercising: Yes.    walking, weights Smoker:  no  Health Maintenance: Pap:  04-10-14 neg History of abnormal Pap:  no MMG:  04-20-2020 category c density birads 2:neg Colonoscopy:  2013 mild diverticulosis f/u 4yrs BMD:   08-06-2020 TDaP:  2021 Gardasil:   n/a Covid-19: pfizer Hep C testing: neg 2017 Screening Labs: with PCP   reports that she has never smoked. She has never used smokeless tobacco. She reports current alcohol use of about 7.0 standard drinks of alcohol per week. She reports that she does not use drugs.  Past Medical History:  Diagnosis Date  . Allergic rhinitis   . Blood transfusion    after tonsillectomy as child; and after hysterectomy  . Diverticulosis 2003  . Hypothyroidism 12/2006  . Vitamin D deficiency     Past Surgical History:  Procedure Laterality Date  . ABDOMINAL HYSTERECTOMY    . BLEPHAROPLASTY    . BUNIONECTOMY     hammertoe right '01, hammertoe left '06  . COLONOSCOPY  06/15/2012  . TONSILLECTOMY    . TUBAL LIGATION    . VAGINAL HYSTERECTOMY  1988   had prolapse and blood transfusion    Current Outpatient Medications  Medication Sig Dispense Refill  . azelastine (OPTIVAR) 0.05 % ophthalmic solution Place 1 drop into both eyes 2 (two) times daily. 6 mL 5  . BIOTIN PO Take 10,000 mg by mouth.    . Cetirizine HCl 10 MG CAPS Take by mouth as needed. Reported on 11/21/2015    . Cholecalciferol (VITAMIN D PO) Take 5,000 Int'l Units by mouth.    . estradiol (ESTRACE) 0.1 MG/GM vaginal cream Use 1/2 g vaginally twice weekly 42.5 g 0  . fluticasone (FLONASE) 50 MCG/ACT nasal spray SHAKE LQ AND U 1 TO 2 SPRAYS IEN QD    . levothyroxine (SYNTHROID) 50 MCG tablet Take 1 tablet (50 mcg total) by mouth daily before  breakfast. 90 tablet 1  . mometasone (ELOCON) 0.1 % cream Apply 1 application topically 2 (two) times daily. 45 g 0  . montelukast (SINGULAIR) 10 MG tablet Take 10 mg by mouth at bedtime as needed.    . rosuvastatin (CRESTOR) 5 MG tablet Take 1 tablet (5 mg total) by mouth daily. (Patient taking differently: Take 5 mg by mouth. 3 a week) 90 tablet 1  . UNABLE TO FIND Allergy shots 2 shots every 2 weeks    . zolpidem (AMBIEN) 5 MG tablet TAKE 1 TABLET BY MOUTH EVERY NIGHT AT BEDTIME AS NEEDED FOR INSOMNIA 30 tablet 3  . EPINEPHrine 0.3 mg/0.3 mL IJ SOAJ injection SMARTSIG:1 Pre-Filled Pen Syringe IM Once PRN (Patient not taking: Reported on 12/31/2020)     No current facility-administered medications for this visit.    Family History  Problem Relation Age of Onset  . Lung cancer Father        age 10  . Coronary artery disease Mother   . Breast cancer Mother   . Thyroid disease Mother   . Hypertension Mother   . Osteoporosis Mother   . Diabetes Paternal Uncle   . Asthma Brother   . Epilepsy Brother   . Diabetes Maternal Grandmother   . Multiple births  Paternal Grandmother   . Colon cancer Neg Hx   . Stomach cancer Neg Hx     Review of Systems  Constitutional: Negative.   HENT: Negative.   Eyes: Negative.   Respiratory: Negative.   Cardiovascular: Negative.   Gastrointestinal: Negative.   Endocrine: Negative.   Genitourinary: Negative.   Musculoskeletal: Negative.   Skin: Negative.   Allergic/Immunologic: Negative.   Neurological: Negative.   Hematological: Negative.   Psychiatric/Behavioral: Negative.     Exam:   BP 116/72   Pulse 68   Resp 16   Ht 4' 11.75" (1.518 m)   Wt 138 lb (62.6 kg)   LMP 07/28/1987   BMI 27.18 kg/m   Height: 4' 11.75" (151.8 cm)  General appearance: alert, cooperative and appears stated age, no acute distress Head: Normocephalic, without obvious abnormality Neck: no adenopathy, thyroid normal to inspection and palpation Lungs: clear to  auscultation bilaterally Breasts: No axillary or supraclavicular adenopathy, Normal to palpation without dominant masses Heart: regular rate and rhythm Abdomen: soft, non-tender; no masses,  no organomegaly Extremities: extremities normal, no edema Skin: No rashes or lesions Lymph nodes: Cervical, supraclavicular, and axillary nodes normal. No abnormal inguinal nodes palpated Neurologic: Grossly normal   Pelvic: External genitalia:  no lesions              Urethra:  normal appearing urethra with no masses, tenderness or lesions              Bartholins and Skenes: normal                 Vagina: normal appearing vagina, appropriate for age, normal appearing discharge, no lesions              Cervix: neg cervical motion tenderness, no visible lesions             Bimanual Exam:   Uterus:  uterus absent              Adnexa: no mass, fullness, tenderness                 Joy, CMA Chaperone was present for exam.  A:  Well Woman with normal exam  P:   Pap : discontinued R/T hyst  Mammogram: due 04/2021  Labs:with PCP  Medications: uses estrace vaginal occassionally, will call if refill needed  F/u 1-2 years as needed

## 2020-12-31 ENCOUNTER — Encounter: Payer: Self-pay | Admitting: Nurse Practitioner

## 2020-12-31 ENCOUNTER — Other Ambulatory Visit: Payer: Self-pay

## 2020-12-31 ENCOUNTER — Ambulatory Visit: Payer: PPO | Admitting: Obstetrics and Gynecology

## 2020-12-31 ENCOUNTER — Ambulatory Visit: Payer: PPO | Admitting: Nurse Practitioner

## 2020-12-31 VITALS — BP 116/72 | HR 68 | Resp 16 | Ht 59.75 in | Wt 138.0 lb

## 2020-12-31 DIAGNOSIS — Z01419 Encounter for gynecological examination (general) (routine) without abnormal findings: Secondary | ICD-10-CM | POA: Diagnosis not present

## 2020-12-31 NOTE — Patient Instructions (Signed)
Health Maintenance After Age 77 After age 77, you are at a higher risk for certain long-term diseases and infections as well as injuries from falls. Falls are a major cause of broken bones and head injuries in people who are older than age 77. Getting regular preventive care can help to keep you healthy and well. Preventive care includes getting regular testing and making lifestyle changes as recommended by your health care provider. Talk with your health care provider about:  Which screenings and tests you should have. A screening is a test that checks for a disease when you have no symptoms.  A diet and exercise plan that is right for you. What should I know about screenings and tests to prevent falls? Screening and testing are the best ways to find a health problem early. Early diagnosis and treatment give you the best chance of managing medical conditions that are common after age 77. Certain conditions and lifestyle choices may make you more likely to have a fall. Your health care provider may recommend:  Regular vision checks. Poor vision and conditions such as cataracts can make you more likely to have a fall. If you wear glasses, make sure to get your prescription updated if your vision changes.  Medicine review. Work with your health care provider to regularly review all of the medicines you are taking, including over-the-counter medicines. Ask your health care provider about any side effects that may make you more likely to have a fall. Tell your health care provider if any medicines that you take make you feel dizzy or sleepy.  Osteoporosis screening. Osteoporosis is a condition that causes the bones to get weaker. This can make the bones weak and cause them to break more easily.  Blood pressure screening. Blood pressure changes and medicines to control blood pressure can make you feel dizzy.  Strength and balance checks. Your health care provider may recommend certain tests to check your  strength and balance while standing, walking, or changing positions.  Foot health exam. Foot pain and numbness, as well as not wearing proper footwear, can make you more likely to have a fall.  Depression screening. You may be more likely to have a fall if you have a fear of falling, feel emotionally low, or feel unable to do activities that you used to do.  Alcohol use screening. Using too much alcohol can affect your balance and may make you more likely to have a fall. What actions can I take to lower my risk of falls? General instructions  Talk with your health care provider about your risks for falling. Tell your health care provider if: ? You fall. Be sure to tell your health care provider about all falls, even ones that seem minor. ? You feel dizzy, sleepy, or off-balance.  Take over-the-counter and prescription medicines only as told by your health care provider. These include any supplements.  Eat a healthy diet and maintain a healthy weight. A healthy diet includes low-fat dairy products, low-fat (lean) meats, and fiber from whole grains, beans, and lots of fruits and vegetables. Home safety  Remove any tripping hazards, such as rugs, cords, and clutter.  Install safety equipment such as grab bars in bathrooms and safety rails on stairs.  Keep rooms and walkways well-lit. Activity  Follow a regular exercise program to stay fit. This will help you maintain your balance. Ask your health care provider what types of exercise are appropriate for you.  If you need a cane or walker,   use it as recommended by your health care provider.  Wear supportive shoes that have nonskid soles.   Lifestyle  Do not drink alcohol if your health care provider tells you not to drink.  If you drink alcohol, limit how much you have: ? 0-1 drink a day for women. ? 0-2 drinks a day for men.  Be aware of how much alcohol is in your drink. In the [place] one drink equals one typical bottle of beer (12  oz), one-half glass of wine (5 oz), or one shot of hard liquor (1 oz).  Do not use any products that contain nicotine or tobacco, such as cigarettes and e-cigarettes. If you need help quitting, ask your health care provider. Summary  Having a healthy lifestyle and getting preventive care can help to protect your health and wellness after age 77.  Screening and testing are the best way to find a health problem early and help you avoid having a fall. Early diagnosis and treatment give you the best chance for managing medical conditions that are more common for people who are older than age 77.  Falls are a major cause of broken bones and head injuries in people who are older than age 77. Take precautions to prevent a fall at home.  Work with your health care provider to learn what changes you can make to improve your health and wellness and to prevent falls. This information is not intended to replace advice given to you by your health care provider. Make sure you discuss any questions you have with your health care provider. Document Revised: 02/10/2019 Document Reviewed: 09/02/2017 Elsevier Patient Education  2021 Elsevier Inc.  

## 2021-01-01 DIAGNOSIS — J301 Allergic rhinitis due to pollen: Secondary | ICD-10-CM | POA: Diagnosis not present

## 2021-01-01 DIAGNOSIS — J3089 Other allergic rhinitis: Secondary | ICD-10-CM | POA: Diagnosis not present

## 2021-01-04 DIAGNOSIS — J301 Allergic rhinitis due to pollen: Secondary | ICD-10-CM | POA: Diagnosis not present

## 2021-01-04 DIAGNOSIS — J3089 Other allergic rhinitis: Secondary | ICD-10-CM | POA: Diagnosis not present

## 2021-01-08 DIAGNOSIS — L570 Actinic keratosis: Secondary | ICD-10-CM | POA: Diagnosis not present

## 2021-01-08 DIAGNOSIS — D485 Neoplasm of uncertain behavior of skin: Secondary | ICD-10-CM | POA: Diagnosis not present

## 2021-01-08 DIAGNOSIS — Z85828 Personal history of other malignant neoplasm of skin: Secondary | ICD-10-CM | POA: Diagnosis not present

## 2021-01-08 DIAGNOSIS — D2272 Melanocytic nevi of left lower limb, including hip: Secondary | ICD-10-CM | POA: Diagnosis not present

## 2021-01-08 DIAGNOSIS — D225 Melanocytic nevi of trunk: Secondary | ICD-10-CM | POA: Diagnosis not present

## 2021-01-08 DIAGNOSIS — L821 Other seborrheic keratosis: Secondary | ICD-10-CM | POA: Diagnosis not present

## 2021-01-08 DIAGNOSIS — D1801 Hemangioma of skin and subcutaneous tissue: Secondary | ICD-10-CM | POA: Diagnosis not present

## 2021-01-08 DIAGNOSIS — D2271 Melanocytic nevi of right lower limb, including hip: Secondary | ICD-10-CM | POA: Diagnosis not present

## 2021-01-10 ENCOUNTER — Other Ambulatory Visit: Payer: Self-pay | Admitting: Internal Medicine

## 2021-01-10 DIAGNOSIS — E785 Hyperlipidemia, unspecified: Secondary | ICD-10-CM

## 2021-01-14 ENCOUNTER — Telehealth: Payer: Self-pay | Admitting: Pharmacist

## 2021-01-14 DIAGNOSIS — J301 Allergic rhinitis due to pollen: Secondary | ICD-10-CM | POA: Diagnosis not present

## 2021-01-14 DIAGNOSIS — J3089 Other allergic rhinitis: Secondary | ICD-10-CM | POA: Diagnosis not present

## 2021-01-14 NOTE — Progress Notes (Addendum)
° ° °  Chronic Care Management Pharmacy Assistant   Name: Maria Montgomery  MRN: [id number] DOB: January 07, 1946   Reason for Encounter: General Disease States Call   Conditions to be addressed/monitored: HLD   Recent office visits:  12/31/20 Dr. Karma Ganja NP, Gynecology 11/26/20 Dr. Ronnald Ramp, Internal Medicine  Recent consult visits:  [facility]:  None in previous 6 months  Medications: Outpatient Encounter Medications as of 01/14/2021  Medication Sig   azelastine (OPTIVAR) 0.05 % ophthalmic solution Place 1 drop into both eyes 2 (two) times daily.   BIOTIN PO Take 10,000 mg by mouth.   Cetirizine HCl 10 MG CAPS Take by mouth as needed. Reported on 11/21/2015   Cholecalciferol (VITAMIN D PO) Take 5,000 Int'l Units by mouth.   EPINEPHrine 0.3 mg/0.3 mL IJ SOAJ injection SMARTSIG:1 Pre-Filled Pen Syringe IM Once PRN (Patient not taking: Reported on 12/31/2020)   estradiol (ESTRACE) 0.1 MG/GM vaginal cream Use 1/2 g vaginally twice weekly   fluticasone (FLONASE) 50 MCG/ACT nasal spray SHAKE LQ AND U 1 TO 2 SPRAYS IEN QD   levothyroxine (SYNTHROID) 50 MCG tablet Take 1 tablet (50 mcg total) by mouth daily before breakfast.   mometasone (ELOCON) 0.1 % cream Apply 1 application topically 2 (two) times daily.   montelukast (SINGULAIR) 10 MG tablet Take 10 mg by mouth at bedtime as needed.   rosuvastatin (CRESTOR) 5 MG tablet TAKE 1 TABLET(5 MG) BY MOUTH DAILY   UNABLE TO FIND Allergy shots 2 shots every 2 weeks   zolpidem (AMBIEN) 5 MG tablet TAKE 1 TABLET BY MOUTH EVERY NIGHT AT BEDTIME AS NEEDED FOR INSOMNIA   No facility-administered encounter medications on file as of 01/14/2021.     Star Rating Drugs: No ACE/ARB   A general adherence call was made to [name] to ask how she has been doing since she last spoke with clinical pharmacist Mendel Ryder. The patient states that she has been doing well and that she has not had any new health issues. She states that she is taking her  azelastine daily now instead of prn. Overall the patient states there has been no changes. I let her know that I will pass along the information to the clinical pharmacist Mendel Ryder.    Wendy Poet, [place] [phone]

## 2021-01-28 DIAGNOSIS — J301 Allergic rhinitis due to pollen: Secondary | ICD-10-CM | POA: Diagnosis not present

## 2021-01-28 DIAGNOSIS — J3089 Other allergic rhinitis: Secondary | ICD-10-CM | POA: Diagnosis not present

## 2021-01-29 ENCOUNTER — Telehealth: Payer: Self-pay | Admitting: Pharmacist

## 2021-01-29 NOTE — Progress Notes (Signed)
    Chronic Care Management Pharmacy Assistant   Name: Maria Montgomery  MRN: [id number] DOB: 1946/04/13   Reason for Encounter: Chart Review    Medications: Outpatient Encounter Medications as of 01/29/2021  Medication Sig  . azelastine (OPTIVAR) 0.05 % ophthalmic solution Place 1 drop into both eyes 2 (two) times daily.  Marland Kitchen BIOTIN PO Take 10,000 mg by mouth.  . Cetirizine HCl 10 MG CAPS Take by mouth as needed. Reported on 11/21/2015  . Cholecalciferol (VITAMIN D PO) Take 5,000 Int'l Units by mouth.  . EPINEPHrine 0.3 mg/0.3 mL IJ SOAJ injection SMARTSIG:1 Pre-Filled Pen Syringe IM Once PRN (Patient not taking: Reported on 12/31/2020)  . estradiol (ESTRACE) 0.1 MG/GM vaginal cream Use 1/2 g vaginally twice weekly  . fluticasone (FLONASE) 50 MCG/ACT nasal spray SHAKE LQ AND U 1 TO 2 SPRAYS IEN QD  . levothyroxine (SYNTHROID) 50 MCG tablet Take 1 tablet (50 mcg total) by mouth daily before breakfast.  . mometasone (ELOCON) 0.1 % cream Apply 1 application topically 2 (two) times daily.  . montelukast (SINGULAIR) 10 MG tablet Take 10 mg by mouth at bedtime as needed.  . rosuvastatin (CRESTOR) 5 MG tablet TAKE 1 TABLET(5 MG) BY MOUTH DAILY  . UNABLE TO FIND Allergy shots 2 shots every 2 weeks  . zolpidem (AMBIEN) 5 MG tablet TAKE 1 TABLET BY MOUTH EVERY NIGHT AT BEDTIME AS NEEDED FOR INSOMNIA   No facility-administered encounter medications on file as of 01/29/2021.    Reviewed chart for medication changes and adherence.   No gaps in adherence identified. Patient has follow up scheduled with pharmacy team. No further action required.   Wendy Poet, [place] [phone]

## 2021-02-11 DIAGNOSIS — J301 Allergic rhinitis due to pollen: Secondary | ICD-10-CM | POA: Diagnosis not present

## 2021-02-11 DIAGNOSIS — J3089 Other allergic rhinitis: Secondary | ICD-10-CM | POA: Diagnosis not present

## 2021-02-20 ENCOUNTER — Other Ambulatory Visit: Payer: Self-pay | Admitting: Obstetrics and Gynecology

## 2021-02-20 DIAGNOSIS — N952 Postmenopausal atrophic vaginitis: Secondary | ICD-10-CM

## 2021-02-20 NOTE — Telephone Encounter (Signed)
Annual exam 12/11/20.  Note reads "  Medications: uses estrace vaginal occassionally, will call if refill needed."

## 2021-02-25 DIAGNOSIS — J301 Allergic rhinitis due to pollen: Secondary | ICD-10-CM | POA: Diagnosis not present

## 2021-02-25 DIAGNOSIS — J3089 Other allergic rhinitis: Secondary | ICD-10-CM | POA: Diagnosis not present

## 2021-02-27 DIAGNOSIS — J3089 Other allergic rhinitis: Secondary | ICD-10-CM | POA: Diagnosis not present

## 2021-02-27 DIAGNOSIS — J301 Allergic rhinitis due to pollen: Secondary | ICD-10-CM | POA: Diagnosis not present

## 2021-03-04 ENCOUNTER — Telehealth: Payer: Self-pay | Admitting: Pharmacist

## 2021-03-04 NOTE — Progress Notes (Signed)
    Chronic Care Management Pharmacy Assistant   Name: DAYJA LOVERIDGE  MRN: [id number] DOB: 1946/02/27  Reason for Encounter: Disease State-General Adherence   Recent office visits:   11/26/20 Ronnald Ramp (PCP) - Hypothyroidism. Start Levothyroxine Sodium 50 mcg. F/u 6 months.  Recent consult visits:  12/31/20 Dixon (GYN) - Annual. F/u 1-2 years. Azelastine HCl decrease from 0.15 to 0.05%, 1 drop 2x day.  10/17/20 Whelan (Allergy) - Weekly Immunotherapy injections.  Hospital visits:  None in previous 6 months  Medications: Outpatient Encounter Medications as of 03/04/2021  Medication Sig  . azelastine (OPTIVAR) 0.05 % ophthalmic solution Place 1 drop into both eyes 2 (two) times daily.  Marland Kitchen BIOTIN PO Take 10,000 mg by mouth.  . Cetirizine HCl 10 MG CAPS Take by mouth as needed. Reported on 11/21/2015  . Cholecalciferol (VITAMIN D PO) Take 5,000 Int'l Units by mouth.  . EPINEPHrine 0.3 mg/0.3 mL IJ SOAJ injection SMARTSIG:1 Pre-Filled Pen Syringe IM Once PRN (Patient not taking: Reported on 12/31/2020)  . estradiol (ESTRACE) 0.1 MG/GM vaginal cream USE 0.5 GRAM VAGINALLY 2 TIMES A WEEK  . fluticasone (FLONASE) 50 MCG/ACT nasal spray SHAKE LQ AND U 1 TO 2 SPRAYS IEN QD  . levothyroxine (SYNTHROID) 50 MCG tablet Take 1 tablet (50 mcg total) by mouth daily before breakfast.  . mometasone (ELOCON) 0.1 % cream Apply 1 application topically 2 (two) times daily.  . montelukast (SINGULAIR) 10 MG tablet Take 10 mg by mouth at bedtime as needed.  . rosuvastatin (CRESTOR) 5 MG tablet TAKE 1 TABLET(5 MG) BY MOUTH DAILY  . UNABLE TO FIND Allergy shots 2 shots every 2 weeks  . zolpidem (AMBIEN) 5 MG tablet TAKE 1 TABLET BY MOUTH EVERY NIGHT AT BEDTIME AS NEEDED FOR INSOMNIA   No facility-administered encounter medications on file as of 03/04/2021.    Have you had any problems recently with your health? Patient states just seasonal allergies.  Have you had any problems with your pharmacy? Patient states no  problem with pharmacy.   What issues or side effects are you having with your medications? Patient states no side effects.   What would you like me to pass along to [facility] for them to help you with?  Patient states not at this time.   What can we do to take care of you better? Patient states no, we are doing a good job.   Star Rating Drugs: Rosuvastatin -  Last fill 01/10/21 [place], RMA Clinical Pharmacists Assistant [phone]  Time Spent: 70

## 2021-03-13 DIAGNOSIS — J301 Allergic rhinitis due to pollen: Secondary | ICD-10-CM | POA: Diagnosis not present

## 2021-03-13 DIAGNOSIS — J3089 Other allergic rhinitis: Secondary | ICD-10-CM | POA: Diagnosis not present

## 2021-03-25 DIAGNOSIS — J3089 Other allergic rhinitis: Secondary | ICD-10-CM | POA: Diagnosis not present

## 2021-03-25 DIAGNOSIS — J301 Allergic rhinitis due to pollen: Secondary | ICD-10-CM | POA: Diagnosis not present

## 2021-04-08 DIAGNOSIS — J301 Allergic rhinitis due to pollen: Secondary | ICD-10-CM | POA: Diagnosis not present

## 2021-04-08 DIAGNOSIS — J3089 Other allergic rhinitis: Secondary | ICD-10-CM | POA: Diagnosis not present

## 2021-04-10 ENCOUNTER — Telehealth: Payer: Self-pay | Admitting: Pharmacist

## 2021-04-10 ENCOUNTER — Telehealth: Payer: PPO

## 2021-04-10 NOTE — Progress Notes (Deleted)
Chronic Care Management Pharmacy Note  04/10/2021 Name:  Maria Montgomery MRN:  [id number] DOB:  1946-09-20  Summary: ***  Recommendations/Changes made from today's visit: ***  Plan: ***   Subjective: Maria Montgomery is an 75 y.o. year old female who is a primary patient of Janith Lima, MD.  The CCM team was consulted for assistance with disease management and care coordination needs.    Engaged with patient by telephone for follow up visit in response to provider referral for pharmacy case management and/or care coordination services.   Consent to Services:  The patient was given information about Chronic Care Management services, agreed to services, and gave verbal consent prior to initiation of services.  Please see initial visit note for detailed documentation.   Patient Care Team: Janith Lima, MD as PCP - General (Internal Medicine) Romine, Lubertha South, MD (Obstetrics and Gynecology) Wallene Huh, DPM (Podiatry) Danella Sensing, MD (Dermatology) Tiajuana Amass, MD (Gynecology) Charlton Haws, [facility] as Pharmacist (Pharmacist) Nunzio Cobbs, MD as Consulting Physician (Obstetrics and Gynecology) Rutherford Guys, MD as Consulting Physician (Ophthalmology)  Recent office visits: 11/26/20 Dr Ronnald Ramp OV: f/u hypothyroidism, no med changes. F/U 6 months  Recent consult visits: 01/14/21 Dr Orvil Feil (allergy): f/u allergic rhinitis 01/08/21 Dr Ronnald Ramp (dermatology): f/u skin cancer 12/31/20 NP Doren Custard (gynecology): well woman exam.  Hospital visits: None in previous 6 months   Objective:  Lab Results  Component Value Date   CREATININE 0.89 05/24/2020   BUN 19 05/24/2020   GFR 61.77 05/26/2019   GFRNONAA 76.26 02/05/2010   GFRAA 81 12/24/2007   NA 138 05/24/2020   K 4.5 05/24/2020   CALCIUM 9.4 05/24/2020   CO2 25 05/24/2020   GLUCOSE 92 05/24/2020    Lab Results  Component Value Date/Time   GFR 61.77 05/26/2019 08:43 AM   GFR 59.62 (L) 04/22/2018 08:28 AM     Last diabetic Eye exam: No results found for: HMDIABEYEEXA  Last diabetic Foot exam: No results found for: HMDIABFOOTEX   Lab Results  Component Value Date   CHOL 217 (H) 05/24/2020   HDL 87 05/24/2020   LDLCALC 108 (H) 05/24/2020   LDLDIRECT 97.6 02/05/2009   TRIG 111 05/24/2020   CHOLHDL 2.5 05/24/2020    Hepatic Function Latest Ref Rng & Units 05/24/2020 05/26/2019 04/22/2018  Total Protein 6.1 - 8.1 g/dL 7.0 6.9 7.2  Albumin 3.5 - 5.2 g/dL - 4.4 4.4  AST 10 - 35 U/L '21 18 17  ' ALT 6 - 29 U/L '17 15 16  ' Alk Phosphatase 39 - 117 U/L - 54 58  Total Bilirubin 0.2 - 1.2 mg/dL 0.8 0.5 0.7  Bilirubin, Direct 0.0 - 0.2 mg/dL 0.1 0.3 -    Lab Results  Component Value Date/Time   TSH 1.89 11/26/2020 09:20 AM   TSH 2.61 05/24/2020 09:27 AM   FREET4 0.79 04/05/2013 07:38 AM   FREET4 0.7 12/22/2006 07:40 AM    CBC Latest Ref Rng & Units 05/24/2020 05/26/2019 04/22/2018  WBC 3.8 - 10.8 Thousand/uL 9.3 5.8 5.6  Hemoglobin 11.7 - 15.5 g/dL 14.6 13.8 14.5  Hematocrit 35.0 - 45.0 % 43.5 41.5 43.1  Platelets 140 - 400 Thousand/uL 262 268.0 267.0    Lab Results  Component Value Date/Time   VD25OH 34 05/24/2020 09:27 AM   VD25OH 48.35 11/22/2019 09:54 AM   VD25OH 45.91 04/21/2017 09:01 AM    Clinical ASCVD: No  The 10-year ASCVD risk score Mikey Bussing DC Brooke Bonito., et  al., 2013) is: 17.3%   Values used to calculate the score:     Age: 59 years     Sex: Female     Is Non-Hispanic African American: No     Diabetic: No     Tobacco smoker: No     Systolic Blood Pressure: 037 mmHg     Is BP treated: No     HDL Cholesterol: 87 mg/dL     Total Cholesterol: 217 mg/dL    Depression screen [facility] 2/9 07/20/2020 11/22/2019 04/25/2018  Decreased Interest 0 0 0  Down, Depressed, Hopeless 0 0 0  PHQ - 2 Score 0 0 0     Social History   Tobacco Use  Smoking Status Never Smoker  Smokeless Tobacco Never Used   BP Readings from Last 3 Encounters:  12/31/20 116/72  11/26/20 140/84  07/27/20 134/80    Pulse Readings from Last 3 Encounters:  12/31/20 68  11/26/20 65  07/27/20 72   Wt Readings from Last 3 Encounters:  12/31/20 138 lb (62.6 kg)  11/26/20 139 lb (63 kg)  07/27/20 136 lb 3.2 oz (61.8 kg)   BMI Readings from Last 3 Encounters:  12/31/20 27.18 kg/m  11/26/20 27.15 kg/m  07/27/20 26.60 kg/m    Assessment/Interventions: Review of patient past medical history, allergies, medications, health status, including review of consultants reports, laboratory and other test data, was performed as part of comprehensive evaluation and provision of chronic care management services.   SDOH:  (Social Determinants of Health) assessments and interventions performed: Yes  SDOH Screenings   Alcohol Screen: Low Risk   . Last Alcohol Screening Score (AUDIT): 4  Depression (PHQ2-9): Low Risk   . PHQ-2 Score: 0  Financial Resource Strain: Low Risk   . Difficulty of Paying Living Expenses: Not hard at all  Food Insecurity: No Food Insecurity  . Worried About Charity fundraiser in the Last Year: Never true  . Ran Out of Food in the Last Year: Never true  Housing: Low Risk   . Last Housing Risk Score: 0  Physical Activity: Sufficiently Active  . Days of Exercise per Week: 5 days  . Minutes of Exercise per Session: 60 min  Social Connections: Socially Integrated  . Frequency of Communication with Friends and Family: More than three times a week  . Frequency of Social Gatherings with Friends and Family: More than three times a week  . Attends Religious Services: More than 4 times per year  . Active Member of Clubs or Organizations: Yes  . Attends Archivist Meetings: More than 4 times per year  . Marital Status: Married  Stress: No Stress Concern Present  . Feeling of Stress : Not at all  Tobacco Use: Low Risk   . Smoking Tobacco Use: Never Smoker  . Smokeless Tobacco Use: Never Used  Transportation Needs: No Transportation Needs  . Lack of Transportation (Medical):  No  . Lack of Transportation (Non-Medical): No    CCM Care Plan  Allergies  Allergen Reactions  . Morphine And Related Swelling    Medications Reviewed Today    Reviewed by Karma Ganja, NP (Nurse Practitioner) on 12/31/20 at 1416  Med List Status: <None>  Medication Order Taking? Sig Documenting Provider Last Dose Status Informant  azelastine (OPTIVAR) 0.05 % ophthalmic solution [id number] Yes Place 1 drop into both eyes 2 (two) times daily. Janith Lima, MD Taking Active   BIOTIN PO [id number] Yes Take 10,000 mg by mouth. [provider] Taking Active   Cetirizine HCl 10 MG CAPS [id number] Yes Take by mouth as needed. Reported on 11/21/2015 [provider] Taking Active   Cholecalciferol (VITAMIN D PO) [id number] Yes Take 5,000 Int'l Units by mouth. [provider] Taking Active   EPINEPHrine 0.3 mg/0.3 mL IJ SOAJ injection [id number] No SMARTSIG:1 Pre-Filled Pen Syringe IM Once PRN  Patient not taking: Reported on 12/31/2020   [provider] Not Taking Active   estradiol (ESTRACE) 0.1 MG/GM vaginal cream [id number] Yes Use 1/2 g vaginally twice weekly Nunzio Cobbs, MD Taking Active   fluticasone (FLONASE) 50 MCG/ACT nasal spray [id number] Yes SHAKE LQ AND U 1 TO 2 SPRAYS IEN QD [provider] Taking Active   levothyroxine (SYNTHROID) 50 MCG tablet [id number] Yes Take 1 tablet (50 mcg total) by mouth daily before breakfast. Janith Lima, MD Taking Active   mometasone (ELOCON) 0.1 % cream [id number] Yes Apply 1 application topically 2 (two) times daily. Marrian Salvage, FNP Taking Active   montelukast (SINGULAIR) 10 MG tablet [id number] Yes Take 10 mg by mouth at bedtime as needed. [provider] Taking Active   rosuvastatin (CRESTOR) 5 MG tablet [id number] Yes Take 1 tablet (5 mg total) by mouth daily.  Patient taking differently: Take 5 mg by mouth. 3 a week   Janith Lima, MD Taking Active   UNABLE TO FIND  [id number] Yes Allergy shots 2 shots every 2 weeks [provider] Taking Active   zolpidem (AMBIEN) 5 MG tablet [id number] Yes TAKE 1 TABLET BY MOUTH EVERY NIGHT AT BEDTIME AS NEEDED FOR INSOMNIA Janith Lima, MD Taking Active           Patient Active Problem List   Diagnosis Date Noted  . Vitamin D deficiency 11/22/2019  . Thinning hair 09/16/2018  . Hyperlipidemia LDL goal <100 04/22/2018  . Primary insomnia 04/21/2017  . Osteopenia, senile 04/17/2015  . Routine health maintenance 03/12/2012  . Hypothyroid 03/10/2012  . Overweight(278.02) 02/09/2009    Immunization History  Administered Date(s) Administered  . Influenza Split 09/10/2011  . Influenza Whole 08/21/2008, 08/09/2009  . Influenza, High Dose Seasonal PF 09/01/2018  . Influenza, Quadrivalent, Recombinant, Inj, Pf 07/19/2019  . Influenza,inj,Quad PF,6+ Mos 07/29/2017  . Influenza-Unspecified 09/03/2013, 08/13/2016  . PFIZER(Purple Top)SARS-COV-2 Vaccination 11/19/2019, 12/07/2019, 07/03/2020, 09/10/2020, 03/21/2021  . Pneumococcal Conjugate-13 04/12/2014  . Pneumococcal Polysaccharide-23 08/25/2007, 10/16/2014, 04/17/2016, 09/13/2018, 09/10/2020  . Td 02/11/2010  . Tdap 03/02/2020  . Zoster Recombinat (Shingrix) 07/29/2017, 03/07/2018  . Zoster, Live 12/23/2006, 12/29/2006    Conditions to be addressed/monitored:  Hyperlipidemia, Hypothyroidism, Allergic Rhinitis and Insomnia  There are no care plans that you recently modified to display for this patient.    Medication Assistance: {MEDASSISTANCEINFO:25044}  Compliance/Adherence/Medication fill history: Care Gaps: None  Star-Rating Drugs: Rosuvastatin - LF 01/10/21 x 90 ds  Patient's preferred pharmacy is:  [id number] - [place], [address] [place] [address] Phone: [phone] Fax: [phone]  [id number] - [place] AT [address] [place] [address] Phone: [phone] Fax: [phone]  Uses pill box? {Yes or If no, why not?:20788} Pt endorses ***% compliance  We discussed: {Pharmacy options:24294} Patient decided to: {US Pharmacy Plan:23885}  Care Plan and Follow Up Patient Decision:  {FOLLOWUP:24991}  Plan: {CM FOLLOW  UP FHLK:56256}  ***    Current Barriers:  . {pharmacybarriers:24917}  Pharmacist Clinical Goal(s):  Marland Kitchen Patient will {PHARMACYGOALCHOICES:24921} through collaboration with PharmD and provider.   Interventions: . 1:1 collaboration with Janith Lima, MD regarding development and update of comprehensive plan of care as evidenced by provider attestation and co-signature . Inter-disciplinary care team collaboration (see longitudinal plan of care) . Comprehensive medication review performed; medication list updated in electronic medical record  Hyperlipidemia   LDL goal < 100 Patient has failed these meds in past: none Patient is currently controlled on the following medications:   Rosuvastatin 5 mg twice a week  We discussed:  diet and exercise extensively  Plan: Continue control with diet and exercise  Insomnia   Patient has failed these meds in past: n/a Patient is currently controlled on the following medications:   Zolpidem 5 mg HS  Melatonin 5 mg HS  We discussed:  Patient is satisfied with current regimen and denies issues  Plan: Continue current medications   Osteopenia   Last DEXA Scan: 08/06/2020 (Dr [facility])             T-Score femoral neck: -0.9             10-year probability of major osteoporotic fracture: 12%             10-year probability of hip fracture: 2.4%  Patient is not a candidate for pharmacologic treatment  Patient has failed these meds in past: n/a Patient is currently controlled on the following medications:   Vitamin D 5000 IU daily   We  discussed:  Recommend [phone] units of vitamin D daily. Recommend 1200 mg of calcium daily from dietary and supplemental sources.  Plan:  Continue current medications and control with diet and exercise  Recommend to add calcium supplement  Allergies   Patient has failed these meds in past: n/a Patient is currently controlled on the following medications:   Bepreve eye drops (not covered)  Azelastine 0.15% nasal spray PRN  Flonase nasal spray PRN  Mometasone 0.1% cream  Cetirizine 10 mg daily  Allergy shots q2 weeks  We discussed: Pt reports occassional nosebleeds, discussed benefits of nasal saline. Pt also reports [name] is very expensive; examined her formulary and Bepreve is non-formulary; azelastine eye drops are covered $12 copay. Contacted prescriber to request switching to covered product.  Plan Continue current medications  Recommend switch from Bepreve to azelastine eye drops for cost savings  Patient Goals/Self-Care Activities . Patient will:  - {pharmacypatientgoals:24919}  Follow Up Plan: {CM FOLLOW UP LSLH:73428}

## 2021-04-10 NOTE — Telephone Encounter (Signed)
  Chronic Care Management   Outreach Note  04/10/2021 Name: Maria Montgomery MRN: [id number] DOB: 10/18/1946  Referred by: Janith Lima, MD  Patient had a phone appointment scheduled with clinical pharmacist today.  An unsuccessful telephone outreach was attempted today. The patient was referred to the pharmacist for assistance with care management and care coordination.   If possible, a message was left to return call to: [phone] or to [facility]: [place], PharmD, Para March, CPP Clinical Pharmacist [facility] [phone]

## 2021-04-25 ENCOUNTER — Other Ambulatory Visit: Payer: Self-pay | Admitting: Internal Medicine

## 2021-04-25 DIAGNOSIS — F5101 Primary insomnia: Secondary | ICD-10-CM

## 2021-04-26 DIAGNOSIS — Z1231 Encounter for screening mammogram for malignant neoplasm of breast: Secondary | ICD-10-CM | POA: Diagnosis not present

## 2021-04-26 LAB — HM MAMMOGRAPHY

## 2021-04-29 DIAGNOSIS — J3089 Other allergic rhinitis: Secondary | ICD-10-CM | POA: Diagnosis not present

## 2021-04-29 DIAGNOSIS — J301 Allergic rhinitis due to pollen: Secondary | ICD-10-CM | POA: Diagnosis not present

## 2021-05-01 NOTE — Progress Notes (Signed)
    Chronic Care Management Pharmacy Assistant   Name: Maria Montgomery  MRN: [id number] DOB: [birth year]   Reason for Encounter: Chart Review   Medications: Outpatient Encounter Medications as of 04/10/2021  Medication Sig   azelastine (OPTIVAR) 0.05 % ophthalmic solution Place 1 drop into both eyes 2 (two) times daily.   BIOTIN PO Take 10,000 mg by mouth.   Cetirizine HCl 10 MG CAPS Take by mouth as needed. Reported on 11/21/2015   Cholecalciferol (VITAMIN D PO) Take 5,000 Int'l Units by mouth.   EPINEPHrine 0.3 mg/0.3 mL IJ SOAJ injection SMARTSIG:1 Pre-Filled Pen Syringe IM Once PRN (Patient not taking: Reported on 12/31/2020)   estradiol (ESTRACE) 0.1 MG/GM vaginal cream USE 0.5 GRAM VAGINALLY 2 TIMES A WEEK   fluticasone (FLONASE) 50 MCG/ACT nasal spray SHAKE LQ AND U 1 TO 2 SPRAYS IEN QD   levothyroxine (SYNTHROID) 50 MCG tablet Take 1 tablet (50 mcg total) by mouth daily before breakfast.   mometasone (ELOCON) 0.1 % cream Apply 1 application topically 2 (two) times daily.   montelukast (SINGULAIR) 10 MG tablet Take 10 mg by mouth at bedtime as needed.   rosuvastatin (CRESTOR) 5 MG tablet TAKE 1 TABLET(5 MG) BY MOUTH DAILY   UNABLE TO FIND Allergy shots 2 shots every 2 weeks   [DISCONTINUED] zolpidem (AMBIEN) 5 MG tablet TAKE 1 TABLET BY MOUTH EVERY NIGHT AT BEDTIME AS NEEDED FOR INSOMNIA   No facility-administered encounter medications on file as of 04/10/2021.   Pharmacist Review  Reviewed chart for medication changes and adherence.  No OVs, Consults, or hospital visits since last care coordination call / Pharmacist visit. No medication changes indicated  No gaps in adherence identified. Patient has follow up scheduled with pharmacy team. No further action required.   [place] Pharmacist Assistant [phone]

## 2021-05-07 ENCOUNTER — Telehealth: Payer: Self-pay | Admitting: Pharmacist

## 2021-05-08 NOTE — Progress Notes (Signed)
    Chronic Care Management Pharmacy Assistant   Name: Maria Montgomery  MRN: [id number] DOB: 27-Oct-1946   Reason for Encounter: Disease State   Conditions to be addressed/monitored: General Call   Recent office visits:  None ID  Recent consult visits:  None ID  Hospital visits:  None in previous 6 months  Medications: Outpatient Encounter Medications as of 05/07/2021  Medication Sig   azelastine (OPTIVAR) 0.05 % ophthalmic solution Place 1 drop into both eyes 2 (two) times daily.   BIOTIN PO Take 10,000 mg by mouth.   Cetirizine HCl 10 MG CAPS Take by mouth as needed. Reported on 11/21/2015   Cholecalciferol (VITAMIN D PO) Take 5,000 Int'l Units by mouth.   EPINEPHrine 0.3 mg/0.3 mL IJ SOAJ injection SMARTSIG:1 Pre-Filled Pen Syringe IM Once PRN (Patient not taking: Reported on 12/31/2020)   estradiol (ESTRACE) 0.1 MG/GM vaginal cream USE 0.5 GRAM VAGINALLY 2 TIMES A WEEK   fluticasone (FLONASE) 50 MCG/ACT nasal spray SHAKE LQ AND U 1 TO 2 SPRAYS IEN QD   levothyroxine (SYNTHROID) 50 MCG tablet Take 1 tablet (50 mcg total) by mouth daily before breakfast.   mometasone (ELOCON) 0.1 % cream Apply 1 application topically 2 (two) times daily.   montelukast (SINGULAIR) 10 MG tablet Take 10 mg by mouth at bedtime as needed.   rosuvastatin (CRESTOR) 5 MG tablet TAKE 1 TABLET(5 MG) BY MOUTH DAILY   UNABLE TO FIND Allergy shots 2 shots every 2 weeks   zolpidem (AMBIEN) 5 MG tablet TAKE 1 TABLET BY MOUTH EVERY NIGHT AT BEDTIME AS NEEDED FOR INSOMNIA   No facility-administered encounter medications on file as of 05/07/2021.    Pharmacist Review  Have you had any problems recently with your health? Patient states that she is doing well and has not had any new health issues at this time  Have you had any problems with your pharmacy? Patient states that she has not had any problems with getting medications or the cost of medications from the pharmacy  What issues or side effects are you  having with your medications? Patient states that she has not had any side effects from medications  What would you like me to pass along to [facility] for them to help you with?  Patient states that she is doing well and does not have any concerns about health or medications at this time  What can we do to take care of you better?   Patient states that if any changes she will contact Dr. Ronnald Ramp office  Star Rating Drugs: Rosuvastatin 01/10/21 90 ds  [place] Pharmacist Assistant [phone]   Time spent:16

## 2021-05-13 DIAGNOSIS — J3089 Other allergic rhinitis: Secondary | ICD-10-CM | POA: Diagnosis not present

## 2021-05-13 DIAGNOSIS — J301 Allergic rhinitis due to pollen: Secondary | ICD-10-CM | POA: Diagnosis not present

## 2021-05-27 DIAGNOSIS — J301 Allergic rhinitis due to pollen: Secondary | ICD-10-CM | POA: Diagnosis not present

## 2021-05-27 DIAGNOSIS — J3089 Other allergic rhinitis: Secondary | ICD-10-CM | POA: Diagnosis not present

## 2021-05-29 ENCOUNTER — Ambulatory Visit: Payer: PPO | Admitting: Obstetrics and Gynecology

## 2021-05-30 DIAGNOSIS — J3089 Other allergic rhinitis: Secondary | ICD-10-CM | POA: Diagnosis not present

## 2021-05-30 DIAGNOSIS — J301 Allergic rhinitis due to pollen: Secondary | ICD-10-CM | POA: Diagnosis not present

## 2021-05-30 NOTE — Progress Notes (Signed)
    Chronic Care Management Pharmacy Assistant   Name: Maria Montgomery  MRN: [id number] DOB: [birth year]  Pharmacist Review  Reviewed chart for medication changes and adherence.  No OVs, Consults, or hospital visits since last care coordination call / Pharmacist visit. No medication changes indicated  No gaps in adherence identified. Patient has follow up scheduled with pharmacy team. No further action required.    [place] Pharmacist Assistant [phone]   Time spent:5

## 2021-06-03 DIAGNOSIS — J3089 Other allergic rhinitis: Secondary | ICD-10-CM | POA: Diagnosis not present

## 2021-06-05 ENCOUNTER — Other Ambulatory Visit: Payer: Self-pay

## 2021-06-05 ENCOUNTER — Encounter: Payer: Self-pay | Admitting: Internal Medicine

## 2021-06-05 ENCOUNTER — Ambulatory Visit (INDEPENDENT_AMBULATORY_CARE_PROVIDER_SITE_OTHER): Payer: PPO | Admitting: Internal Medicine

## 2021-06-05 VITALS — BP 136/84 | HR 68 | Temp 98.1°F | Resp 16 | Ht 59.75 in | Wt 137.0 lb

## 2021-06-05 DIAGNOSIS — Z0000 Encounter for general adult medical examination without abnormal findings: Secondary | ICD-10-CM

## 2021-06-05 DIAGNOSIS — E785 Hyperlipidemia, unspecified: Secondary | ICD-10-CM

## 2021-06-05 DIAGNOSIS — E890 Postprocedural hypothyroidism: Secondary | ICD-10-CM

## 2021-06-05 DIAGNOSIS — E039 Hypothyroidism, unspecified: Secondary | ICD-10-CM | POA: Diagnosis not present

## 2021-06-05 DIAGNOSIS — F5101 Primary insomnia: Secondary | ICD-10-CM

## 2021-06-05 LAB — LIPID PANEL
Cholesterol: 169 mg/dL (ref 0–200)
HDL: 92.6 mg/dL (ref >39.00)
LDL Cholesterol: 54 mg/dL (ref 0–99)
NonHDL: 76.06
Total CHOL/HDL Ratio: 2
Triglycerides: 109 mg/dL (ref 0.0–149.0)
VLDL: 21.8 mg/dL (ref 0.0–40.0)

## 2021-06-05 LAB — CBC WITH DIFFERENTIAL/PLATELET
Basophils Absolute: 0.1 10*3/uL (ref 0.0–0.1)
Basophils Relative: 0.7 % (ref 0.0–3.0)
Eosinophils Absolute: 0.1 10*3/uL (ref 0.0–0.7)
Eosinophils Relative: 1 % (ref 0.0–5.0)
HCT: 41.4 % (ref 36.0–46.0)
Hemoglobin: 13.7 g/dL (ref 12.0–15.0)
Lymphocytes Relative: 23.3 % (ref 12.0–46.0)
Lymphs Abs: 1.7 10*3/uL (ref 0.7–4.0)
MCHC: 33.1 g/dL (ref 30.0–36.0)
MCV: 93.7 fl (ref 78.0–100.0)
Monocytes Absolute: 0.8 10*3/uL (ref 0.1–1.0)
Monocytes Relative: 11.3 % (ref 3.0–12.0)
Neutro Abs: 4.6 10*3/uL (ref 1.4–7.7)
Neutrophils Relative %: 63.7 % (ref 43.0–77.0)
Platelets: 228 10*3/uL (ref 150.0–400.0)
RBC: 4.41 Mil/uL (ref 3.87–5.11)
RDW: 13.4 % (ref 11.5–15.5)
WBC: 7.2 10*3/uL (ref 4.0–10.5)

## 2021-06-05 LAB — HEPATIC FUNCTION PANEL
ALT: 21 U/L (ref 0–35)
AST: 22 U/L (ref 0–37)
Albumin: 4.4 g/dL (ref 3.5–5.2)
Alkaline Phosphatase: 50 U/L (ref 39–117)
Bilirubin, Direct: 0.1 mg/dL (ref 0.0–0.3)
Total Bilirubin: 0.6 mg/dL (ref 0.2–1.2)
Total Protein: 7.4 g/dL (ref 6.0–8.3)

## 2021-06-05 LAB — BASIC METABOLIC PANEL
BUN: 17 mg/dL (ref 6–23)
CO2: 26 mEq/L (ref 19–32)
Calcium: 9.7 mg/dL (ref 8.4–10.5)
Chloride: 102 mEq/L (ref 96–112)
Creatinine, Ser: 0.83 mg/dL (ref 0.40–1.20)
GFR: 68.01 mL/min (ref >60.00)
Glucose, Bld: 94 mg/dL (ref 70–99)
Potassium: 4.5 mEq/L (ref 3.5–5.1)
Sodium: 139 mEq/L (ref 135–145)

## 2021-06-05 LAB — TSH: TSH: 2.47 u[IU]/mL (ref 0.35–5.50)

## 2021-06-05 MED ORDER — ZOLPIDEM TARTRATE 5 MG PO TABS
5.0000 mg | ORAL_TABLET | Freq: Every evening | ORAL | 1 refills | Status: DC | PRN
Start: 2021-06-05 — End: 2021-06-05

## 2021-06-05 MED ORDER — LEVOTHYROXINE SODIUM 50 MCG PO TABS: 50.0000 ug | ORAL_TABLET | Freq: Every day | ORAL | 1 refills | Status: DC

## 2021-06-05 MED ORDER — ZOLPIDEM TARTRATE 5 MG PO TABS
5.0000 mg | ORAL_TABLET | Freq: Every evening | ORAL | 1 refills | Status: DC | PRN
Start: 2021-06-05 — End: 2021-12-30

## 2021-06-05 NOTE — Patient Instructions (Signed)
Health Maintenance, Female Adopting a healthy lifestyle and getting preventive care are important in promoting health and wellness. Ask your health care provider about: The right schedule for you to have regular tests and exams. Things you can do on your own to prevent diseases and keep yourself healthy. What should I know about diet, weight, and exercise? Eat a healthy diet  Eat a diet that includes plenty of vegetables, fruits, low-fat dairy products, and lean protein. Do not eat a lot of foods that are high in solid fats, added sugars, or sodium.  Maintain a healthy weight Body mass index (BMI) is used to identify weight problems. It estimates body fat based on height and weight. Your health care provider can help determineyour BMI and help you achieve or maintain a healthy weight. Get regular exercise Get regular exercise. This is one of the most important things you can do for your health. Most adults should: Exercise for at least 150 minutes each week. The exercise should increase your heart rate and make you sweat (moderate-intensity exercise). Do strengthening exercises at least twice a week. This is in addition to the moderate-intensity exercise. Spend less time sitting. Even light physical activity can be beneficial. Watch cholesterol and blood lipids Have your blood tested for lipids and cholesterol at 77 years of age, then havethis test every 5 years. Have your cholesterol levels checked more often if: Your lipid or cholesterol levels are high. You are older than 77 years of age. You are at high risk for heart disease. What should I know about cancer screening? Depending on your health history and family history, you may need to have cancer screening at various ages. This may include screening for: Breast cancer. Cervical cancer. Colorectal cancer. Skin cancer. Lung cancer. What should I know about heart disease, diabetes, and high blood pressure? Blood pressure and heart  disease High blood pressure causes heart disease and increases the risk of stroke. This is more likely to develop in people who have high blood pressure readings, are of African descent, or are overweight. Have your blood pressure checked: Every 3-5 years if you are 18-39 years of age. Every year if you are 40 years old or older. Diabetes Have regular diabetes screenings. This checks your fasting blood sugar level. Have the screening done: Once every three years after age 40 if you are at a normal weight and have a low risk for diabetes. More often and at a younger age if you are overweight or have a high risk for diabetes. What should I know about preventing infection? Hepatitis B If you have a higher risk for hepatitis B, you should be screened for this virus. Talk with your health care provider to find out if you are at risk forhepatitis B infection. Hepatitis C Testing is recommended for: Everyone born from 1945 through 1965. Anyone with known risk factors for hepatitis C. Sexually transmitted infections (STIs) Get screened for STIs, including gonorrhea and chlamydia, if: You are sexually active and are younger than 77 years of age. You are older than 77 years of age and your health care provider tells you that you are at risk for this type of infection. Your sexual activity has changed since you were last screened, and you are at increased risk for chlamydia or gonorrhea. Ask your health care provider if you are at risk. Ask your health care provider about whether you are at high risk for HIV. Your health care provider may recommend a prescription medicine to help   prevent HIV infection. If you choose to take medicine to prevent HIV, you should first get tested for HIV. You should then be tested every 3 months for as long as you are taking the medicine. Pregnancy If you are about to stop having your period (premenopausal) and you may become pregnant, seek counseling before you get  pregnant. Take 400 to 800 micrograms (mcg) of folic acid every day if you become pregnant. Ask for birth control (contraception) if you want to prevent pregnancy. Osteoporosis and menopause Osteoporosis is a disease in which the bones lose minerals and strength with aging. This can result in bone fractures. If you are 65 years old or older, or if you are at risk for osteoporosis and fractures, ask your health care provider if you should: Be screened for bone loss. Take a calcium or vitamin D supplement to lower your risk of fractures. Be given hormone replacement therapy (HRT) to treat symptoms of menopause. Follow these instructions at home: Lifestyle Do not use any products that contain nicotine or tobacco, such as cigarettes, e-cigarettes, and chewing tobacco. If you need help quitting, ask your health care provider. Do not use street drugs. Do not share needles. Ask your health care provider for help if you need support or information about quitting drugs. Alcohol use Do not drink alcohol if: Your health care provider tells you not to drink. You are pregnant, may be pregnant, or are planning to become pregnant. If you drink alcohol: Limit how much you use to 0-1 drink a day. Limit intake if you are breastfeeding. Be aware of how much alcohol is in your drink. In the [place] one drink equals one 12 oz bottle of beer (355 mL), one 5 oz glass of wine (148 mL), or one 1 oz glass of hard liquor (44 mL). General instructions Schedule regular health, dental, and eye exams. Stay current with your vaccines. Tell your health care provider if: You often feel depressed. You have ever been abused or do not feel safe at home. Summary Adopting a healthy lifestyle and getting preventive care are important in promoting health and wellness. Follow your health care provider's instructions about healthy diet, exercising, and getting tested or screened for diseases. Follow your health care provider's  instructions on monitoring your cholesterol and blood pressure. This information is not intended to replace advice given to you by your health care provider. Make sure you discuss any questions you have with your healthcare provider. Document Revised: 10/13/2018 Document Reviewed: 10/13/2018 Elsevier Patient Education  2022 [employer]  

## 2021-06-05 NOTE — Progress Notes (Signed)
Subjective:  Patient ID: Maria Montgomery, female    DOB: Mar 19, 1946  Age: 75 y.o. MRN: [id number]  CC: Annual Exam, Hypothyroidism, and Hyperlipidemia  This visit occurred during the SARS-CoV-2 public health emergency.  Safety protocols were in place, including screening questions prior to the visit, additional usage of staff PPE, and extensive cleaning of exam room while observing appropriate contact time as indicated for disinfecting solutions.    HPI Maria Montgomery presents for a CPX and f/up -  She is active and denies any recent episodes of chest pain, shortness of breath, diaphoresis, dizziness, or lightheadedness.  Outpatient Medications Prior to Visit  Medication Sig Dispense Refill   azelastine (OPTIVAR) 0.05 % ophthalmic solution Place 1 drop into both eyes 2 (two) times daily. 6 mL 5   BIOTIN PO Take 10,000 mg by mouth.     Cetirizine HCl 10 MG CAPS Take by mouth as needed. Reported on 11/21/2015     Cholecalciferol (VITAMIN D PO) Take 5,000 Int'l Units by mouth.     EPINEPHrine 0.3 mg/0.3 mL IJ SOAJ injection      estradiol (ESTRACE) 0.1 MG/GM vaginal cream USE 0.5 GRAM VAGINALLY 2 TIMES A WEEK 42.5 g 0   fluticasone (FLONASE) 50 MCG/ACT nasal spray SHAKE LQ AND U 1 TO 2 SPRAYS IEN QD     mometasone (ELOCON) 0.1 % cream Apply 1 application topically 2 (two) times daily. 45 g 0   montelukast (SINGULAIR) 10 MG tablet Take 10 mg by mouth at bedtime as needed.     Multiple Vitamins-Minerals (ZINC PO) Take by mouth.     rosuvastatin (CRESTOR) 5 MG tablet TAKE 1 TABLET(5 MG) BY MOUTH DAILY 90 tablet 1   UNABLE TO FIND Allergy shots 2 shots every 2 weeks     levothyroxine (SYNTHROID) 50 MCG tablet Take 1 tablet (50 mcg total) by mouth daily before breakfast. 90 tablet 1   zolpidem (AMBIEN) 5 MG tablet TAKE 1 TABLET BY MOUTH EVERY NIGHT AT BEDTIME AS NEEDED FOR INSOMNIA 90 tablet 1   No facility-administered medications prior to visit.    ROS Review of Systems  Constitutional:   Negative for diaphoresis and fatigue.  HENT: Negative.    Respiratory:  Negative for cough, chest tightness, shortness of breath and wheezing.   Cardiovascular:  Negative for chest pain, palpitations and leg swelling.  Gastrointestinal:  Negative for abdominal pain, constipation and diarrhea.  Endocrine: Negative for cold intolerance and heat intolerance.  Genitourinary: Negative.   Musculoskeletal: Negative.  Negative for arthralgias and myalgias.  Neurological: Negative.  Negative for dizziness, weakness and light-headedness.  Hematological:  Negative for adenopathy. Does not bruise/bleed easily.  Psychiatric/Behavioral:  Positive for sleep disturbance. Negative for behavioral problems, confusion, decreased concentration and dysphoric mood. The patient is not nervous/anxious.    Objective:  BP 136/84 (BP Location: Left Arm, Patient Position: Sitting, Cuff Size: Large)   Pulse 68   Temp 98.1 F (36.7 C) (Oral)   Resp 16   Ht 4' 11.75" (1.518 m)   Wt 137 lb (62.1 kg)   LMP 07/28/1987   SpO2 98%   BMI 26.98 kg/m   BP Readings from Last 3 Encounters:  06/05/21 136/84  12/31/20 116/72  11/26/20 140/84    Wt Readings from Last 3 Encounters:  06/05/21 137 lb (62.1 kg)  12/31/20 138 lb (62.6 kg)  11/26/20 139 lb (63 kg)    Physical Exam Vitals reviewed.  Constitutional:      Appearance: Normal appearance.  HENT:     Nose: Nose normal.     Mouth/Throat:     Mouth: Mucous membranes are moist.  Eyes:     Conjunctiva/sclera: Conjunctivae normal.  Cardiovascular:     Rate and Rhythm: Normal rate and regular rhythm.     Heart sounds: No murmur heard. Pulmonary:     Effort: Pulmonary effort is normal.     Breath sounds: No stridor. No wheezing, rhonchi or rales.  Abdominal:     General: Abdomen is flat.     Palpations: There is no mass.     Tenderness: There is no abdominal tenderness. There is no guarding.     Hernia: No hernia is present.  Musculoskeletal:         General: Normal range of motion.     Cervical back: Neck supple.     Right lower leg: No edema.     Left lower leg: No edema.  Lymphadenopathy:     Cervical: No cervical adenopathy.  Skin:    General: Skin is warm and dry.  Neurological:     General: No focal deficit present.     Mental Status: She is alert.  Psychiatric:        Mood and Affect: Mood normal.        Behavior: Behavior normal.    Lab Results  Component Value Date   WBC 7.2 06/05/2021   HGB 13.7 06/05/2021   HCT 41.4 06/05/2021   PLT 228.0 06/05/2021   GLUCOSE 94 06/05/2021   CHOL 169 06/05/2021   TRIG 109.0 06/05/2021   HDL 92.60 06/05/2021   LDLDIRECT 97.6 02/05/2009   LDLCALC 54 06/05/2021   ALT 21 06/05/2021   AST 22 06/05/2021   NA 139 06/05/2021   K 4.5 06/05/2021   CL 102 06/05/2021   CREATININE 0.83 06/05/2021   BUN 17 06/05/2021   CO2 26 06/05/2021   TSH 2.47 06/05/2021    DG Finger Ring Left  Result Date: 04/22/2018 CLINICAL DATA:  Injured the fourth finger 3 weeks ago with pain and swelling of the PIP joint EXAM: LEFT RING FINGER 2+V COMPARISON:  Left hand films of 05/14/2015 FINDINGS: There may be a tiny nondisplaced avulsion from the distal aspect of the proximal phalanx of the left fourth digit with adjacent soft tissue swelling. No other acute abnormality is seen. Mild degenerative changes present involving the DIP and PIP joints IMPRESSION: 1. Questionable small avulsion fragment from the distal aspect of the proximal phalanx of the left fourth digit with soft tissue swelling. 2. Degenerative change of the DIP joints. Electronically Signed   By: Ivar Drape M.D.   On: 04/22/2018 08:46    Assessment & Plan:   Maria Montgomery was seen today for annual exam, hypothyroidism and hyperlipidemia.  Diagnoses and all orders for this visit:  Acquired hypothyroidism- Her TSH is in the normal range.  She will stay on the current dose of levothyroxine. -     Basic metabolic panel; Future -     CBC with  Differential/Platelet; Future -     TSH; Future -     TSH -     CBC with Differential/Platelet -     Basic metabolic panel -     levothyroxine (SYNTHROID) 50 MCG tablet; Take 1 tablet (50 mcg total) by mouth daily before breakfast.  Hyperlipidemia LDL goal <100- LDL goal achieved. Doing well on the statin  -     Basic metabolic panel; Future -     CBC  with Differential/Platelet; Future -     Lipid panel; Future -     Hepatic function panel; Future -     TSH; Future -     TSH -     Hepatic function panel -     Lipid panel -     CBC with Differential/Platelet -     Basic metabolic panel  Routine health maintenance- Exam completed, labs reviewed, vaccines are up-to-date, no cancer screenings indicated, patient education material was given.  Primary insomnia- Labs are negative for secondary causes.  She wants to continue taking zolpidem. -     Discontinue: zolpidem (AMBIEN) 5 MG tablet; Take 1 tablet (5 mg total) by mouth at bedtime as needed for sleep. -     Basic metabolic panel; Future -     CBC with Differential/Platelet; Future -     zolpidem (AMBIEN) 5 MG tablet; Take 1 tablet (5 mg total) by mouth at bedtime as needed for sleep. -     CBC with Differential/Platelet -     Basic metabolic panel  Postoperative hypothyroidism -     levothyroxine (SYNTHROID) 50 MCG tablet; Take 1 tablet (50 mcg total) by mouth daily before breakfast.  I am having Payten C. Tseng "Ellsworth Lennox" maintain her Cetirizine HCl, mometasone, azelastine, BIOTIN PO, Cholecalciferol (VITAMIN D PO), fluticasone, montelukast, EPINEPHrine, UNABLE TO FIND, rosuvastatin, estradiol, Multiple Vitamins-Minerals (ZINC PO), zolpidem, and levothyroxine.  Meds ordered this encounter  Medications   DISCONTD: zolpidem (AMBIEN) 5 MG tablet    Sig: Take 1 tablet (5 mg total) by mouth at bedtime as needed for sleep.    Dispense:  90 tablet    Refill:  1   zolpidem (AMBIEN) 5 MG tablet    Sig: Take 1 tablet (5 mg total) by  mouth at bedtime as needed for sleep.    Dispense:  90 tablet    Refill:  1   levothyroxine (SYNTHROID) 50 MCG tablet    Sig: Take 1 tablet (50 mcg total) by mouth daily before breakfast.    Dispense:  90 tablet    Refill:  1     Follow-up: Return in about 6 months (around 12/06/2021).  Scarlette Calico, MD

## 2021-06-07 DIAGNOSIS — J3089 Other allergic rhinitis: Secondary | ICD-10-CM | POA: Diagnosis not present

## 2021-06-10 DIAGNOSIS — J301 Allergic rhinitis due to pollen: Secondary | ICD-10-CM | POA: Diagnosis not present

## 2021-06-10 DIAGNOSIS — J3089 Other allergic rhinitis: Secondary | ICD-10-CM | POA: Diagnosis not present

## 2021-06-10 DIAGNOSIS — J3081 Allergic rhinitis due to animal (cat) (dog) hair and dander: Secondary | ICD-10-CM | POA: Diagnosis not present

## 2021-06-24 DIAGNOSIS — I788 Other diseases of capillaries: Secondary | ICD-10-CM | POA: Diagnosis not present

## 2021-06-24 DIAGNOSIS — Z85828 Personal history of other malignant neoplasm of skin: Secondary | ICD-10-CM | POA: Diagnosis not present

## 2021-06-24 DIAGNOSIS — L821 Other seborrheic keratosis: Secondary | ICD-10-CM | POA: Diagnosis not present

## 2021-06-24 DIAGNOSIS — L309 Dermatitis, unspecified: Secondary | ICD-10-CM | POA: Diagnosis not present

## 2021-06-24 DIAGNOSIS — L820 Inflamed seborrheic keratosis: Secondary | ICD-10-CM | POA: Diagnosis not present

## 2021-06-24 DIAGNOSIS — L905 Scar conditions and fibrosis of skin: Secondary | ICD-10-CM | POA: Diagnosis not present

## 2021-06-27 DIAGNOSIS — J301 Allergic rhinitis due to pollen: Secondary | ICD-10-CM | POA: Diagnosis not present

## 2021-06-27 DIAGNOSIS — J3089 Other allergic rhinitis: Secondary | ICD-10-CM | POA: Diagnosis not present

## 2021-07-01 ENCOUNTER — Other Ambulatory Visit: Payer: Self-pay | Admitting: Obstetrics and Gynecology

## 2021-07-01 DIAGNOSIS — N952 Postmenopausal atrophic vaginitis: Secondary | ICD-10-CM

## 2021-07-02 NOTE — Telephone Encounter (Signed)
Last annual exam with Claiborne Billings on 2/22, last mammogram 6/22

## 2021-07-03 ENCOUNTER — Ambulatory Visit: Payer: PPO | Admitting: Podiatry

## 2021-07-10 DIAGNOSIS — J301 Allergic rhinitis due to pollen: Secondary | ICD-10-CM | POA: Diagnosis not present

## 2021-07-11 DIAGNOSIS — J301 Allergic rhinitis due to pollen: Secondary | ICD-10-CM | POA: Diagnosis not present

## 2021-07-11 DIAGNOSIS — J3089 Other allergic rhinitis: Secondary | ICD-10-CM | POA: Diagnosis not present

## 2021-07-18 DIAGNOSIS — H524 Presbyopia: Secondary | ICD-10-CM | POA: Diagnosis not present

## 2021-07-18 DIAGNOSIS — H52202 Unspecified astigmatism, left eye: Secondary | ICD-10-CM | POA: Diagnosis not present

## 2021-07-18 DIAGNOSIS — H5212 Myopia, left eye: Secondary | ICD-10-CM | POA: Diagnosis not present

## 2021-07-18 DIAGNOSIS — H353131 Nonexudative age-related macular degeneration, bilateral, early dry stage: Secondary | ICD-10-CM | POA: Diagnosis not present

## 2021-07-18 DIAGNOSIS — Z961 Presence of intraocular lens: Secondary | ICD-10-CM | POA: Diagnosis not present

## 2021-07-24 DIAGNOSIS — J301 Allergic rhinitis due to pollen: Secondary | ICD-10-CM | POA: Diagnosis not present

## 2021-07-24 DIAGNOSIS — J3089 Other allergic rhinitis: Secondary | ICD-10-CM | POA: Diagnosis not present

## 2021-08-07 DIAGNOSIS — J3089 Other allergic rhinitis: Secondary | ICD-10-CM | POA: Diagnosis not present

## 2021-08-07 DIAGNOSIS — J301 Allergic rhinitis due to pollen: Secondary | ICD-10-CM | POA: Diagnosis not present

## 2021-08-13 ENCOUNTER — Telehealth: Payer: Self-pay

## 2021-08-13 NOTE — Progress Notes (Signed)
    Chronic Care Management Pharmacy Assistant   Name: ASYRIA KOLANDER  MRN: [id number] DOB: [birth year]   Reason for Encounter: Disease State   Conditions to be addressed/monitored: General   Recent office visits:  06/05/21 Janith Lima, MD-PCP (Annual Exam  Hypothyroidism  Hyperlipidemia) Labs ordered  Recent consult visits:  None ID  Hospital visits:  None in previous 6 months  Medications: Outpatient Encounter Medications as of 08/13/2021  Medication Sig   azelastine (OPTIVAR) 0.05 % ophthalmic solution Place 1 drop into both eyes 2 (two) times daily.   BIOTIN PO Take 10,000 mg by mouth.   Cetirizine HCl 10 MG CAPS Take by mouth as needed. Reported on 11/21/2015   Cholecalciferol (VITAMIN D PO) Take 5,000 Int'l Units by mouth.   EPINEPHrine 0.3 mg/0.3 mL IJ SOAJ injection    estradiol (ESTRACE) 0.1 MG/GM vaginal cream USE 0.5 GRAM VAGINALLY 2 TIMES A WEEK   fluticasone (FLONASE) 50 MCG/ACT nasal spray SHAKE LQ AND U 1 TO 2 SPRAYS IEN QD   levothyroxine (SYNTHROID) 50 MCG tablet Take 1 tablet (50 mcg total) by mouth daily before breakfast.   mometasone (ELOCON) 0.1 % cream Apply 1 application topically 2 (two) times daily.   montelukast (SINGULAIR) 10 MG tablet Take 10 mg by mouth at bedtime as needed.   Multiple Vitamins-Minerals (ZINC PO) Take by mouth.   rosuvastatin (CRESTOR) 5 MG tablet TAKE 1 TABLET(5 MG) BY MOUTH DAILY   UNABLE TO FIND Allergy shots 2 shots every 2 weeks   zolpidem (AMBIEN) 5 MG tablet Take 1 tablet (5 mg total) by mouth at bedtime as needed for sleep.   No facility-administered encounter medications on file as of 08/13/2021.   Have you had any problems recently with your health? Patient states that she does not have any new health issues at this time  Have you had any problems with your pharmacy?Patient states that she does not have any problems with getting medications or the cost of medications from the pharmacy  What issues or side effects are  you having with your medications?Patient states that she does not have any side effects from medications  What would you like me to pass along to [facility] for them to help you with? Patient states that she does not have any concerns about health or meds at this time  What can we do to take care of you better? Patient states not at this time  Care Gaps: Colonoscopy-NA Diabetic Foot Exam- Mammogram-04/26/21 Ophthalmology-NA Dexa Scan - 08/06/20 Annual Well Visit - 06/05/21 Micro albumin-NA Hemoglobin A1c-NA  Star Rating Drugs: Rosuvastatin 5 mg   Ethelene Hal Clinical Pharmacist Assistant [phone]

## 2021-08-26 DIAGNOSIS — J3089 Other allergic rhinitis: Secondary | ICD-10-CM | POA: Diagnosis not present

## 2021-08-26 DIAGNOSIS — J301 Allergic rhinitis due to pollen: Secondary | ICD-10-CM | POA: Diagnosis not present

## 2021-08-26 DIAGNOSIS — Z23 Encounter for immunization: Secondary | ICD-10-CM | POA: Diagnosis not present

## 2021-08-28 DIAGNOSIS — H1045 Other chronic allergic conjunctivitis: Secondary | ICD-10-CM | POA: Diagnosis not present

## 2021-08-28 DIAGNOSIS — L309 Dermatitis, unspecified: Secondary | ICD-10-CM | POA: Diagnosis not present

## 2021-08-28 DIAGNOSIS — J3089 Other allergic rhinitis: Secondary | ICD-10-CM | POA: Diagnosis not present

## 2021-08-28 DIAGNOSIS — J301 Allergic rhinitis due to pollen: Secondary | ICD-10-CM | POA: Diagnosis not present

## 2021-08-28 DIAGNOSIS — J0100 Acute maxillary sinusitis, unspecified: Secondary | ICD-10-CM | POA: Diagnosis not present

## 2021-09-12 DIAGNOSIS — J301 Allergic rhinitis due to pollen: Secondary | ICD-10-CM | POA: Diagnosis not present

## 2021-09-12 DIAGNOSIS — L309 Dermatitis, unspecified: Secondary | ICD-10-CM | POA: Diagnosis not present

## 2021-09-12 DIAGNOSIS — H1045 Other chronic allergic conjunctivitis: Secondary | ICD-10-CM | POA: Diagnosis not present

## 2021-09-12 DIAGNOSIS — J3089 Other allergic rhinitis: Secondary | ICD-10-CM | POA: Diagnosis not present

## 2021-09-20 DIAGNOSIS — J3089 Other allergic rhinitis: Secondary | ICD-10-CM | POA: Diagnosis not present

## 2021-09-20 DIAGNOSIS — J301 Allergic rhinitis due to pollen: Secondary | ICD-10-CM | POA: Diagnosis not present

## 2021-10-09 DIAGNOSIS — J3089 Other allergic rhinitis: Secondary | ICD-10-CM | POA: Diagnosis not present

## 2021-10-09 DIAGNOSIS — J301 Allergic rhinitis due to pollen: Secondary | ICD-10-CM | POA: Diagnosis not present

## 2021-10-14 ENCOUNTER — Telehealth: Payer: Self-pay | Admitting: Internal Medicine

## 2021-10-14 ENCOUNTER — Ambulatory Visit: Payer: PPO

## 2021-10-14 NOTE — Telephone Encounter (Signed)
Left message asking pt to call office or my # [phone]  Mickel Baas will be out of office please r/s 12/12 awv appt

## 2021-10-16 ENCOUNTER — Ambulatory Visit (INDEPENDENT_AMBULATORY_CARE_PROVIDER_SITE_OTHER): Payer: PPO

## 2021-10-16 ENCOUNTER — Other Ambulatory Visit: Payer: Self-pay

## 2021-10-16 DIAGNOSIS — Z0000 Encounter for general adult medical examination without abnormal findings: Secondary | ICD-10-CM | POA: Diagnosis not present

## 2021-10-16 NOTE — Progress Notes (Signed)
I connected with Maria Montgomery. Damman today by telephone and verified that I am speaking with the correct person using two identifiers. Location patient: home Location provider: work Persons participating in the virtual visit: patient, provider.   I discussed the limitations, risks, security and privacy concerns of performing an evaluation and management service by telephone and the availability of in person appointments. I also discussed with the patient that there may be a patient responsible charge related to this service. The patient expressed understanding and verbally consented to this telephonic visit.    Interactive audio and video telecommunications were attempted between this provider and patient, however failed, due to patient having technical difficulties OR patient did not have access to video capability.  We continued and completed visit with audio only.  Some vital signs may be absent or patient reported.   Time Spent with patient on telephone encounter: 40 minutes  Subjective:   Maria Montgomery is a 75 y.o. female who presents for Medicare Annual (Subsequent) preventive examination.  Review of Systems     Cardiac Risk Factors include: advanced age (>59men, >83 women);family history of premature cardiovascular disease;dyslipidemia     Objective:    There were no vitals filed for this visit. There is no height or weight on file to calculate BMI.  Advanced Directives 10/16/2021 07/20/2020 04/25/2017 04/21/2016  Does Patient Have a Medical Advance Directive? Yes Yes Yes Yes  Type of Advance Directive Living will;Healthcare Power of [place];Living will [place];Living will Living will;Healthcare Power of Attorney  Does patient want to make changes to medical advance directive? No - Patient declined No - Patient declined No - Patient declined No - Patient declined  Copy of [place] in Chart? No - copy requested No -  copy requested Yes Yes    Current Medications (verified) Outpatient Encounter Medications as of 10/16/2021  Medication Sig   azelastine (OPTIVAR) 0.05 % ophthalmic solution Place 1 drop into both eyes 2 (two) times daily.   BIOTIN PO Take 10,000 mg by mouth.   Cetirizine HCl 10 MG CAPS Take by mouth as needed. Reported on 11/21/2015   Cholecalciferol (VITAMIN D PO) Take 5,000 Int'l Units by mouth.   EPINEPHrine 0.3 mg/0.3 mL IJ SOAJ injection    estradiol (ESTRACE) 0.1 MG/GM vaginal cream USE 0.5 GRAM VAGINALLY 2 TIMES A WEEK   fluticasone (FLONASE) 50 MCG/ACT nasal spray SHAKE LQ AND U 1 TO 2 SPRAYS IEN QD   levothyroxine (SYNTHROID) 50 MCG tablet Take 1 tablet (50 mcg total) by mouth daily before breakfast.   mometasone (ELOCON) 0.1 % cream Apply 1 application topically 2 (two) times daily.   montelukast (SINGULAIR) 10 MG tablet Take 10 mg by mouth at bedtime as needed.   Multiple Vitamins-Minerals (ZINC PO) Take by mouth.   rosuvastatin (CRESTOR) 5 MG tablet TAKE 1 TABLET(5 MG) BY MOUTH DAILY   UNABLE TO FIND Allergy shots 2 shots every 2 weeks   zolpidem (AMBIEN) 5 MG tablet Take 1 tablet (5 mg total) by mouth at bedtime as needed for sleep.   No facility-administered encounter medications on file as of 10/16/2021.    Allergies (verified) Morphine and related   History: Past Medical History:  Diagnosis Date   Allergic rhinitis    Blood transfusion    after tonsillectomy as child; and after hysterectomy   Diverticulosis 2003   Hypothyroidism 12/2006   Vitamin D deficiency    Past Surgical History:  Procedure  Laterality Date   ABDOMINAL HYSTERECTOMY     BLEPHAROPLASTY     BUNIONECTOMY     hammertoe right '01, hammertoe left '06   COLONOSCOPY  06/15/2012   TONSILLECTOMY     TUBAL LIGATION     VAGINAL HYSTERECTOMY  1988   had prolapse and blood transfusion   Family History  Problem Relation Age of Onset   Lung cancer Father        age 9   Coronary artery disease  Mother    Breast cancer Mother    Thyroid disease Mother    Hypertension Mother    Osteoporosis Mother    Diabetes Paternal Uncle    Asthma Brother    Epilepsy Brother    Diabetes Maternal Grandmother    Multiple births Paternal Grandmother    Colon cancer Neg Hx    Stomach cancer Neg Hx    Social History   Socioeconomic History   Marital status: Married    Spouse name: Not on file   Number of children: Not on file   Years of education: Not on file   Highest education level: Not on file  Occupational History   Not on file  Tobacco Use   Smoking status: Never   Smokeless tobacco: Never  Substance and Sexual Activity   Alcohol use: Yes    Alcohol/week: 7.0 standard drinks    Types: 7 Standard drinks or equivalent per week   Drug use: No   Sexual activity: Yes    Birth control/protection: Surgical    Comment: hysterectomy  Other Topics Concern   Not on file  Social History Narrative   College grad- elementary education.  Married '67  Son-'69; Daughter- '71; 5 grandchildren. Very active in the community. Marriage is in good health   Social Determinants of Health   Financial Resource Strain: Low Risk    Difficulty of Paying Living Expenses: Not hard at all  Food Insecurity: No Food Insecurity   Worried About Charity fundraiser in the Last Year: Never true   Arboriculturist in the Last Year: Never true  Transportation Needs: No Transportation Needs   Lack of Transportation (Medical): No   Lack of Transportation (Non-Medical): No  Physical Activity: Sufficiently Active   Days of Exercise per Week: 5 days   Minutes of Exercise per Session: 60 min  Stress: No Stress Concern Present   Feeling of Stress : Not at all  Social Connections: Socially Integrated   Frequency of Communication with Friends and Family: More than three times a week   Frequency of Social Gatherings with Friends and Family: More than three times a week   Attends Religious Services: More than 4 times  per year   Active Member of [employer] or Organizations: Yes   Attends Music therapist: More than 4 times per year   Marital Status: Married    Tobacco Counseling Counseling given: Not Answered   Clinical Intake:  Pre-visit preparation completed: Yes  Pain : No/denies pain     Nutritional Risks: None Diabetes: No  How often do you need to have someone help you when you read instructions, pamphlets, or other written materials from your doctor or pharmacy?: 1 - Never What is the last grade level you completed in school?: Bachelor's Degree  Diabetic? no  Interpreter Needed?: No  Information entered by :: Lisette Abu, LPN   Activities of Daily Living In your present state of health, do you have any difficulty performing  the following activities: 10/16/2021  Hearing? N  Vision? N  Difficulty concentrating or making decisions? N  Walking or climbing stairs? N  Dressing or bathing? N  Doing errands, shopping? N  Preparing Food and eating ? N  Using the Toilet? N  In the past six months, have you accidently leaked urine? N  Do you have problems with loss of bowel control? N  Managing your Medications? N  Managing your Finances? N  Housekeeping or managing your Housekeeping? N  Some recent data might be hidden    Patient Care Team: Janith Lima, MD as PCP - General (Internal Medicine) Romine, Lubertha South, MD (Obstetrics and Gynecology) Wallene Huh, DPM (Podiatry) Danella Sensing, MD (Dermatology) Tiajuana Amass, MD (Gynecology) Charlton Haws, [facility] as Pharmacist (Pharmacist) Nunzio Cobbs, MD as Consulting Physician (Obstetrics and Gynecology) Rutherford Guys, MD as Consulting Physician (Ophthalmology)  Indicate any recent Medical Services you may have received from other than [facility] providers in the past year (date may be approximate).     Assessment:   This is a routine wellness examination for [facility].  Hearing/Vision screen Hearing  Screening - Comments:: Patient denied any hearing difficulty.   No hearing aids.  Vision Screening - Comments:: Patient wears corrective glasses/contacts.  Eye exam done annually by: Dr. Rutherford Guys.  Dietary issues and exercise activities discussed: Current Exercise Habits: Home exercise routine;Structured exercise class, Type of exercise: walking;treadmill;strength training/weights;Other - see comments (aerobics and flexabilty & balance class), Time (Minutes): 60, Frequency (Times/Week): 5, Weekly Exercise (Minutes/Week): 300, Intensity: Moderate, Exercise limited by: None identified   Goals Addressed               This Visit's Progress     Patient Stated (pt-stated)        My goal is to increase my physical activity and travel more.      Depression Screen PHQ 2/9 Scores 10/16/2021 07/20/2020 11/22/2019 04/25/2018 04/25/2017 04/21/2017 04/21/2016  PHQ - 2 Score 0 0 0 0 0 0 0    Fall Risk Fall Risk  10/16/2021 07/20/2020 11/22/2019 04/25/2018 04/25/2017  Falls in the past year? 0 0 0 No No  Number falls in past yr: 0 0 0 - -  Injury with Fall? 0 0 0 - -  Risk for fall due to : No Fall Risks No Fall Risks - - -  Follow up Falls prevention discussed Falls evaluation completed;Education provided Falls evaluation completed - -    FALL RISK PREVENTION PERTAINING TO THE HOME:  Any stairs in or around the home? Yes  If so, are there any without handrails? No  Home free of loose throw rugs in walkways, pet beds, electrical cords, etc? Yes  Adequate lighting in your home to reduce risk of falls? Yes   ASSISTIVE DEVICES UTILIZED TO PREVENT FALLS:  Life alert? No  Use of a cane, walker or w/c? No  Grab bars in the bathroom? Yes  Shower chair or bench in shower? Yes  Elevated toilet seat or a handicapped toilet? No   TIMED UP AND GO:  Was the test performed? No .  Length of time to ambulate 10 feet: n/a sec.   Gait steady and fast without use of assistive device  Cognitive  Function: Normal cognitive status assessed by direct observation by this Nurse Health Advisor. No abnormalities found.       6CIT Screen 07/20/2020  What Year? 0 points  What month? 0 points  What time? 0  points  Count back from 20 0 points  Months in reverse 0 points  Repeat phrase 0 points  Total Score 0    Immunizations Immunization History  Administered Date(s) Administered   Influenza Split 09/10/2011   Influenza Whole 08/21/2008, 08/09/2009   Influenza, High Dose Seasonal PF 09/01/2018   Influenza, Quadrivalent, Recombinant, Inj, Pf 07/19/2019   Influenza,inj,Quad PF,6+ Mos 07/29/2017   Influenza-Unspecified 09/03/2013, 08/13/2016   PFIZER(Purple Top)SARS-COV-2 Vaccination 11/19/2019, 12/07/2019, 07/03/2020, 09/10/2020, 03/21/2021   Pneumococcal Conjugate-13 04/12/2014   Pneumococcal Polysaccharide-23 08/25/2007, 10/16/2014, 04/17/2016, 09/13/2018, 09/10/2020   Td 02/11/2010   Tdap 03/02/2020   Zoster Recombinat (Shingrix) 07/29/2017, 03/07/2018   Zoster, Live 12/23/2006, 12/29/2006    TDAP status: Up to date  Flu Vaccine status: Up to date  Pneumococcal vaccine status: Up to date  Covid-19 vaccine status: Completed vaccines  Qualifies for Shingles Vaccine? Yes   Zostavax completed Yes   Shingrix Completed?: Yes  Screening Tests Health Maintenance  Topic Date Due   MAMMOGRAM  04/26/2022   DEXA SCAN  08/06/2022   TETANUS/TDAP  03/02/2030   Pneumonia Vaccine 33+ Years old  Completed   INFLUENZA VACCINE  Completed   COVID-19 Vaccine  Completed   Hepatitis C Screening  Completed   Zoster Vaccines- Shingrix  Completed   HPV VACCINES  Aged Out   COLONOSCOPY (Pts 45-40yrs Insurance coverage will need to be confirmed)  Discontinued    Health Maintenance  There are no preventive care reminders to display for this patient.  Colorectal cancer screening: No longer required.   Mammogram status: Completed 04/26/2021. Repeat every year  Bone Density status:  Completed 08/06/2020. Results reflect: Bone density results: OSTEOPENIA. Repeat every 2-3 years.  Lung Cancer Screening: (Low Dose CT Chest recommended if Age 24-80 years, 30 pack-year currently smoking OR have quit w/in 15years.) does not qualify.   Lung Cancer Screening Referral: no  Additional Screening:  Hepatitis C Screening: does qualify; Completed no  Vision Screening: Recommended annual ophthalmology exams for early detection of glaucoma and other disorders of the eye. Is the patient up to date with their annual eye exam?  Yes  Who is the provider or what is the name of the office in which the patient attends annual eye exams? Rutherford Guys, MD. If pt is not established with a provider, would they like to be referred to a provider to establish care? No .   Dental Screening: Recommended annual dental exams for proper oral hygiene  Community Resource Referral / Chronic Care Management: CRR required this visit?  No   CCM required this visit?  No      Plan:     I have personally reviewed and noted the following in the patients chart:   Medical and social history Use of alcohol, tobacco or illicit drugs  Current medications and supplements including opioid prescriptions.  Functional ability and status Nutritional status Physical activity Advanced directives List of other physicians Hospitalizations, surgeries, and ER visits in previous 12 months Vitals Screenings to include cognitive, depression, and falls Referrals and appointments  In addition, I have reviewed and discussed with patient certain preventive protocols, quality metrics, and best practice recommendations. A written personalized care plan for preventive services as well as general preventive health recommendations were provided to patient.     Sheral Flow, LPN   00/17/[id number]   Nurse Notes:  Patient is cogitatively intact. There were no vitals filed for this visit. There is no height or weight on  file to calculate  BMI. Patient stated that she has no issues with gait or balance; does not use any assistive devices. Medications reviewed with patient; no opioid use noted.

## 2021-10-16 NOTE — Patient Instructions (Signed)
Ms. Scheib , Thank you for taking time to come for your Medicare Wellness Visit. I appreciate your ongoing commitment to your health goals. Please review the following plan we discussed and let me know if I can assist you in the future.   Screening recommendations/referrals: Colonoscopy: 06/16/2012; no longer recommended due to age 75: 04/26/2021; due every 1-2 years Bone Density: 08/06/2020; due every 2-3 years Recommended yearly ophthalmology/optometry visit for glaucoma screening and checkup Recommended yearly dental visit for hygiene and checkup  Vaccinations: Influenza vaccine: 09/12/2021 Pneumococcal vaccine: 04/12/2014, 09/10/2020 Tdap vaccine: 03/02/2020; due every 10 years Shingles vaccine: 07/29/2017, 03/07/2018   Covid-19: 11/19/2019, 12/07/2019, 07/03/2020, 03/21/2021, 09/06/2021  Advanced directives: Please bring a copy of your health care power of attorney and living will to the office at your convenience.  Conditions/risks identified: Yes; Client understands the importance of follow-up with providers by attending scheduled visits and discussed goals to eat healthier, increase physical activity, exercise the brain, socialize more, get enough sleep and make time for laughter.  Next appointment: Please schedule your next Medicare Wellness Visit with your Nurse Health Advisor in 1 year by calling [phone].   Preventive Care 24 Years and Older, Female Preventive care refers to lifestyle choices and visits with your health care provider that can promote health and wellness. What does preventive care include? A yearly physical exam. This is also called an annual well check. Dental exams once or twice a year. Routine eye exams. Ask your health care provider how often you should have your eyes checked. Personal lifestyle choices, including: Daily care of your teeth and gums. Regular physical activity. Eating a healthy diet. Avoiding tobacco and drug use. Limiting alcohol  use. Practicing safe sex. Taking low-dose aspirin every day. Taking vitamin and mineral supplements as recommended by your health care provider. What happens during an annual well check? The services and screenings done by your health care provider during your annual well check will depend on your age, overall health, lifestyle risk factors, and family history of disease. Counseling  Your health care provider may ask you questions about your: Alcohol use. Tobacco use. Drug use. Emotional well-being. Home and relationship well-being. Sexual activity. Eating habits. History of falls. Memory and ability to understand (cognition). Work and work Statistician. Reproductive health. Screening  You may have the following tests or measurements: Height, weight, and BMI. Blood pressure. Lipid and cholesterol levels. These may be checked every 5 years, or more frequently if you are over 10 years old. Skin check. Lung cancer screening. You may have this screening every year starting at age 89 if you have a 30-pack-year history of smoking and currently smoke or have quit within the past 15 years. Fecal occult blood test (FOBT) of the stool. You may have this test every year starting at age 69. Flexible sigmoidoscopy or colonoscopy. You may have a sigmoidoscopy every 5 years or a colonoscopy every 10 years starting at age 45. Hepatitis C blood test. Hepatitis B blood test. Sexually transmitted disease (STD) testing. Diabetes screening. This is done by checking your blood sugar (glucose) after you have not eaten for a while (fasting). You may have this done every 1-3 years. Bone density scan. This is done to screen for osteoporosis. You may have this done starting at age 52. Mammogram. This may be done every 1-2 years. Talk to your health care provider about how often you should have regular mammograms. Talk with your health care provider about your test results, treatment options, and if necessary,  the need for more tests. Vaccines  Your health care provider may recommend certain vaccines, such as: Influenza vaccine. This is recommended every year. Tetanus, diphtheria, and acellular pertussis (Tdap, Td) vaccine. You may need a Td booster every 10 years. Zoster vaccine. You may need this after age 40. Pneumococcal 13-valent conjugate (PCV13) vaccine. One dose is recommended after age 49. Pneumococcal polysaccharide (PPSV23) vaccine. One dose is recommended after age 70. Talk to your health care provider about which screenings and vaccines you need and how often you need them. This information is not intended to replace advice given to you by your health care provider. Make sure you discuss any questions you have with your health care provider. Document Released: 11/16/2015 Document Revised: 07/09/2016 Document Reviewed: 08/21/2015 Elsevier Interactive Patient Education  2017 [employer] Prevention in the Home Falls can cause injuries. They can happen to people of all ages. There are many things you can do to make your home safe and to help prevent falls. What can I do on the outside of my home? Regularly fix the edges of walkways and driveways and fix any cracks. Remove anything that might make you trip as you walk through a door, such as a raised step or threshold. Trim any bushes or trees on the path to your home. Use bright outdoor lighting. Clear any walking paths of anything that might make someone trip, such as rocks or tools. Regularly check to see if handrails are loose or broken. Make sure that both sides of any steps have handrails. Any raised decks and porches should have guardrails on the edges. Have any leaves, snow, or ice cleared regularly. Use sand or salt on walking paths during winter. Clean up any spills in your garage right away. This includes oil or grease spills. What can I do in the bathroom? Use night lights. Install grab bars by the toilet and in the  tub and shower. Do not use towel bars as grab bars. Use non-skid mats or decals in the tub or shower. If you need to sit down in the shower, use a plastic, non-slip stool. Keep the floor dry. Clean up any water that spills on the floor as soon as it happens. Remove soap buildup in the tub or shower regularly. Attach bath mats securely with double-sided non-slip rug tape. Do not have throw rugs and other things on the floor that can make you trip. What can I do in the bedroom? Use night lights. Make sure that you have a light by your bed that is easy to reach. Do not use any sheets or blankets that are too big for your bed. They should not hang down onto the floor. Have a firm chair that has side arms. You can use this for support while you get dressed. Do not have throw rugs and other things on the floor that can make you trip. What can I do in the kitchen? Clean up any spills right away. Avoid walking on wet floors. Keep items that you use a lot in easy-to-reach places. If you need to reach something above you, use a strong step stool that has a grab bar. Keep electrical cords out of the way. Do not use floor polish or wax that makes floors slippery. If you must use wax, use non-skid floor wax. Do not have throw rugs and other things on the floor that can make you trip. What can I do with my stairs? Do not leave any items on the  stairs. Make sure that there are handrails on both sides of the stairs and use them. Fix handrails that are broken or loose. Make sure that handrails are as long as the stairways. Check any carpeting to make sure that it is firmly attached to the stairs. Fix any carpet that is loose or worn. Avoid having throw rugs at the top or bottom of the stairs. If you do have throw rugs, attach them to the floor with carpet tape. Make sure that you have a light switch at the top of the stairs and the bottom of the stairs. If you do not have them, ask someone to add them for  you. What else can I do to help prevent falls? Wear shoes that: Do not have high heels. Have rubber bottoms. Are comfortable and fit you well. Are closed at the toe. Do not wear sandals. If you use a stepladder: Make sure that it is fully opened. Do not climb a closed stepladder. Make sure that both sides of the stepladder are locked into place. Ask someone to hold it for you, if possible. Clearly mark and make sure that you can see: Any grab bars or handrails. First and last steps. Where the edge of each step is. Use tools that help you move around (mobility aids) if they are needed. These include: Canes. Walkers. Scooters. Crutches. Turn on the lights when you go into a dark area. Replace any light bulbs as soon as they burn out. Set up your furniture so you have a clear path. Avoid moving your furniture around. If any of your floors are uneven, fix them. If there are any pets around you, be aware of where they are. Review your medicines with your doctor. Some medicines can make you feel dizzy. This can increase your chance of falling. Ask your doctor what other things that you can do to help prevent falls. This information is not intended to replace advice given to you by your health care provider. Make sure you discuss any questions you have with your health care provider. Document Released: 08/16/2009 Document Revised: 03/27/2016 Document Reviewed: 11/24/2014 Elsevier Interactive Patient Education  2017 [employer].

## 2021-10-21 DIAGNOSIS — J301 Allergic rhinitis due to pollen: Secondary | ICD-10-CM | POA: Diagnosis not present

## 2021-10-21 DIAGNOSIS — J3089 Other allergic rhinitis: Secondary | ICD-10-CM | POA: Diagnosis not present

## 2021-10-23 DIAGNOSIS — J301 Allergic rhinitis due to pollen: Secondary | ICD-10-CM | POA: Diagnosis not present

## 2021-11-08 DIAGNOSIS — J301 Allergic rhinitis due to pollen: Secondary | ICD-10-CM | POA: Diagnosis not present

## 2021-11-08 DIAGNOSIS — J3089 Other allergic rhinitis: Secondary | ICD-10-CM | POA: Diagnosis not present

## 2021-11-12 ENCOUNTER — Ambulatory Visit: Payer: PPO | Admitting: Podiatry

## 2021-11-12 DIAGNOSIS — J301 Allergic rhinitis due to pollen: Secondary | ICD-10-CM | POA: Diagnosis not present

## 2021-11-13 ENCOUNTER — Encounter: Payer: Self-pay | Admitting: Podiatry

## 2021-11-13 ENCOUNTER — Ambulatory Visit: Payer: PPO | Admitting: Podiatry

## 2021-11-13 ENCOUNTER — Ambulatory Visit (INDEPENDENT_AMBULATORY_CARE_PROVIDER_SITE_OTHER): Payer: PPO

## 2021-11-13 ENCOUNTER — Other Ambulatory Visit: Payer: Self-pay

## 2021-11-13 DIAGNOSIS — M2042 Other hammer toe(s) (acquired), left foot: Secondary | ICD-10-CM

## 2021-11-13 DIAGNOSIS — M21612 Bunion of left foot: Secondary | ICD-10-CM | POA: Diagnosis not present

## 2021-11-13 DIAGNOSIS — M21611 Bunion of right foot: Secondary | ICD-10-CM | POA: Diagnosis not present

## 2021-11-13 DIAGNOSIS — L03119 Cellulitis of unspecified part of limb: Secondary | ICD-10-CM

## 2021-11-13 DIAGNOSIS — M2041 Other hammer toe(s) (acquired), right foot: Secondary | ICD-10-CM

## 2021-11-13 DIAGNOSIS — L02619 Cutaneous abscess of unspecified foot: Secondary | ICD-10-CM | POA: Diagnosis not present

## 2021-11-13 DIAGNOSIS — M21619 Bunion of unspecified foot: Secondary | ICD-10-CM

## 2021-11-13 MED ORDER — DOXYCYCLINE HYCLATE 100 MG PO TABS: 100.0000 mg | ORAL_TABLET | Freq: Two times a day (BID) | ORAL | 1 refills | Status: DC

## 2021-11-13 NOTE — Progress Notes (Signed)
Subjective:   Patient ID: Maria Montgomery, female   DOB: 76 y.o.   MRN: [id number]   HPI Patient presents stating I am getting a lot of inflammation between the fourth and fifth toes on my left foot and then some pressure on my right foot where I had previous digital surgery done.  States its been hurting for around a month on the left foot    ROS      Objective:  Physical Exam  Neurovascular status intact muscle strength adequate with patient found to have mild breakdown of tissue between the fourth and fifth digits of the left foot localized with no proximal erythema edema or drainage noted except for some local irritation of tissue.  On the right foot I noted there is some elevation of the second toe with keratotic reactive tissue subsecond metatarsal mildly tender     Assessment:  Probability for interspace lesion left with bone against bone compression with possibility for low-grade bacterial infection versus reactivity with inflammation with pressure on the second MPJ right     Plan:  H&P reviewed both conditions and educated her on both problems.  For the right we will continue with just good shoe gear and cushion and for the left I did discuss inflammation versus infection and I went ahead today and placed her on doxycycline for 10 days twice daily and if symptoms persist and redness is completely resolved we will consider treating this is inflammation which I explained to her and she is due to go to [place] at the end of the months we will try to treat it prior to that.  X-rays were taken bilateral and did indicate possible compression between the fourth and fifth digit left no osteolysis noted and on the right there is significant elevation of the second digit but no other pathology noted with previous surgery that is been with fixation in place

## 2021-11-15 DIAGNOSIS — J3089 Other allergic rhinitis: Secondary | ICD-10-CM | POA: Diagnosis not present

## 2021-11-15 DIAGNOSIS — J301 Allergic rhinitis due to pollen: Secondary | ICD-10-CM | POA: Diagnosis not present

## 2021-11-18 DIAGNOSIS — J301 Allergic rhinitis due to pollen: Secondary | ICD-10-CM | POA: Diagnosis not present

## 2021-11-21 DIAGNOSIS — J301 Allergic rhinitis due to pollen: Secondary | ICD-10-CM | POA: Diagnosis not present

## 2021-11-21 DIAGNOSIS — J3089 Other allergic rhinitis: Secondary | ICD-10-CM | POA: Diagnosis not present

## 2021-11-29 DIAGNOSIS — J3089 Other allergic rhinitis: Secondary | ICD-10-CM | POA: Diagnosis not present

## 2021-11-29 DIAGNOSIS — J301 Allergic rhinitis due to pollen: Secondary | ICD-10-CM | POA: Diagnosis not present

## 2021-12-24 DIAGNOSIS — J3089 Other allergic rhinitis: Secondary | ICD-10-CM | POA: Diagnosis not present

## 2021-12-24 DIAGNOSIS — J3081 Allergic rhinitis due to animal (cat) (dog) hair and dander: Secondary | ICD-10-CM | POA: Diagnosis not present

## 2021-12-24 DIAGNOSIS — J301 Allergic rhinitis due to pollen: Secondary | ICD-10-CM | POA: Diagnosis not present

## 2021-12-26 ENCOUNTER — Ambulatory Visit: Payer: PPO | Admitting: Internal Medicine

## 2021-12-27 ENCOUNTER — Other Ambulatory Visit: Payer: Self-pay

## 2021-12-27 ENCOUNTER — Encounter: Payer: Self-pay | Admitting: Podiatry

## 2021-12-27 ENCOUNTER — Ambulatory Visit: Payer: PPO | Admitting: Podiatry

## 2021-12-27 DIAGNOSIS — M2042 Other hammer toe(s) (acquired), left foot: Secondary | ICD-10-CM | POA: Diagnosis not present

## 2021-12-27 DIAGNOSIS — M21619 Bunion of unspecified foot: Secondary | ICD-10-CM

## 2021-12-27 DIAGNOSIS — M2041 Other hammer toe(s) (acquired), right foot: Secondary | ICD-10-CM

## 2021-12-27 DIAGNOSIS — Q828 Other specified congenital malformations of skin: Secondary | ICD-10-CM | POA: Diagnosis not present

## 2021-12-27 NOTE — Progress Notes (Signed)
Subjective:   Patient ID: Maria Montgomery, female   DOB: 76 y.o.   MRN: [id number]   HPI Patient states she has had some improvement on the left foot between the toes but still has a lesion there she is concerned about has digital deformity right second toe and a lesion underneath the right foot that is painful   ROS      Objective:  Physical Exam  Neurovascular status intact small interspace lesion fourth interspace left but no redness erythema surrounding the area and moderate elevation second toe right with keratotic lesion subsecond metatarsal right with lucent core     Assessment:  Porokeratotic lesion right chronic in nature along with interspace lesion left fourth interspace not infected currently and only mildly tender     Plan:  H&P and for the left I did discuss the possibility of partial syndactylization with arthroplasty but at this point we will hold off unless it were to become symptomatic in the future and today [facility] sterile debridement of lesion right plantar foot accomplished no iatrogenic bleeding

## 2021-12-30 ENCOUNTER — Encounter: Payer: Self-pay | Admitting: Internal Medicine

## 2021-12-30 ENCOUNTER — Other Ambulatory Visit: Payer: Self-pay

## 2021-12-30 ENCOUNTER — Ambulatory Visit (INDEPENDENT_AMBULATORY_CARE_PROVIDER_SITE_OTHER): Payer: PPO | Admitting: Internal Medicine

## 2021-12-30 DIAGNOSIS — F5101 Primary insomnia: Secondary | ICD-10-CM | POA: Diagnosis not present

## 2021-12-30 DIAGNOSIS — E890 Postprocedural hypothyroidism: Secondary | ICD-10-CM

## 2021-12-30 DIAGNOSIS — E039 Hypothyroidism, unspecified: Secondary | ICD-10-CM

## 2021-12-30 MED ORDER — ZOLPIDEM TARTRATE 5 MG PO TABS: 5.0000 mg | ORAL_TABLET | Freq: Every evening | ORAL | 1 refills | Status: DC | PRN

## 2021-12-30 NOTE — Patient Instructions (Signed)

## 2021-12-30 NOTE — Progress Notes (Signed)
Subjective:  Patient ID: Maria Montgomery, female    DOB: [birth year]  Age: 76 y.o. MRN: [id number]  CC: Hypothyroidism  This visit occurred during the SARS-CoV-2 public health emergency.  Safety protocols were in place, including screening questions prior to the visit, additional usage of staff PPE, and extensive cleaning of exam room while observing appropriate contact time as indicated for disinfecting solutions.    HPI Maria Montgomery presents for f/up -  She is active and denies chest pain, shortness of breath, diaphoresis, edema, or fatigue.  Outpatient Medications Prior to Visit  Medication Sig Dispense Refill   azelastine (OPTIVAR) 0.05 % ophthalmic solution Place 1 drop into both eyes 2 (two) times daily. 6 mL 5   BIOTIN PO Take 10,000 mg by mouth.     Cetirizine HCl 10 MG CAPS Take by mouth as needed. Reported on 11/21/2015     Cholecalciferol (VITAMIN D PO) Take 5,000 Int'l Units by mouth.     EPINEPHrine 0.3 mg/0.3 mL IJ SOAJ injection      estradiol (ESTRACE) 0.1 MG/GM vaginal cream USE 0.5 GRAM VAGINALLY 2 TIMES A WEEK 42.5 g 0   fluticasone (FLONASE) 50 MCG/ACT nasal spray SHAKE LQ AND U 1 TO 2 SPRAYS IEN QD     mometasone (ELOCON) 0.1 % cream Apply 1 application topically 2 (two) times daily. 45 g 0   montelukast (SINGULAIR) 10 MG tablet Take 10 mg by mouth at bedtime as needed.     Multiple Vitamins-Minerals (ZINC PO) Take by mouth.     rosuvastatin (CRESTOR) 5 MG tablet TAKE 1 TABLET(5 MG) BY MOUTH DAILY 90 tablet 1   UNABLE TO FIND Allergy shots 2 shots every 2 weeks     doxycycline (VIBRA-TABS) 100 MG tablet Take 1 tablet (100 mg total) by mouth 2 (two) times daily. 20 tablet 1   levothyroxine (SYNTHROID) 50 MCG tablet Take 1 tablet (50 mcg total) by mouth daily before breakfast. 90 tablet 1   zolpidem (AMBIEN) 5 MG tablet Take 1 tablet (5 mg total) by mouth at bedtime as needed for sleep. 90 tablet 1   No facility-administered medications prior to visit.     ROS Review of Systems  Constitutional:  Negative for diaphoresis and fatigue.  HENT: Negative.    Respiratory: Negative.  Negative for cough, chest tightness, shortness of breath and wheezing.   Cardiovascular:  Negative for chest pain, palpitations and leg swelling.  Gastrointestinal:  Negative for abdominal pain, constipation, diarrhea, nausea and vomiting.  Endocrine: Negative for cold intolerance and heat intolerance.  Genitourinary: Negative.  Negative for difficulty urinating.  Musculoskeletal: Negative.   Skin: Negative.   Neurological:  Negative for dizziness, weakness, light-headedness and headaches.  Hematological:  Negative for adenopathy. Does not bruise/bleed easily.  Psychiatric/Behavioral:  Positive for sleep disturbance. Negative for dysphoric mood. The patient is not nervous/anxious.    Objective:  BP 138/84 (BP Location: Right Arm, Patient Position: Sitting, Cuff Size: Normal)    Pulse 71    Temp 98 F (36.7 C) (Oral)    Ht 4' 11.75" (1.518 m)    Wt 136 lb (61.7 kg)    LMP 07/28/1987    SpO2 99%    BMI 26.78 kg/m   BP Readings from Last 3 Encounters:  12/30/21 138/84  06/05/21 136/84  12/31/20 116/72    Wt Readings from Last 3 Encounters:  12/30/21 136 lb (61.7 kg)  06/05/21 137 lb (62.1 kg)  12/31/20 138 lb (62.6 kg)  Physical Exam Vitals reviewed.  HENT:     Mouth/Throat:     Mouth: Mucous membranes are moist.  Eyes:     Conjunctiva/sclera: Conjunctivae normal.  Cardiovascular:     Rate and Rhythm: Normal rate and regular rhythm.     Heart sounds: No murmur heard. Pulmonary:     Effort: Pulmonary effort is normal.     Breath sounds: No stridor. No wheezing, rhonchi or rales.  Abdominal:     Palpations: There is no mass.     Tenderness: There is no abdominal tenderness. There is no guarding.     Hernia: No hernia is present.  Musculoskeletal:        General: Normal range of motion.     Cervical back: Neck supple.     Right lower leg: No  edema.     Left lower leg: No edema.  Lymphadenopathy:     Cervical: No cervical adenopathy.  Skin:    General: Skin is warm and dry.  Neurological:     General: No focal deficit present.     Mental Status: She is alert.  Psychiatric:        Mood and Affect: Mood normal.        Behavior: Behavior normal.    Lab Results  Component Value Date   WBC 7.2 06/05/2021   HGB 13.7 06/05/2021   HCT 41.4 06/05/2021   PLT 228.0 06/05/2021   GLUCOSE 94 06/05/2021   CHOL 169 06/05/2021   TRIG 109.0 06/05/2021   HDL 92.60 06/05/2021   LDLDIRECT 97.6 02/05/2009   LDLCALC 54 06/05/2021   ALT 21 06/05/2021   AST 22 06/05/2021   NA 139 06/05/2021   K 4.5 06/05/2021   CL 102 06/05/2021   CREATININE 0.83 06/05/2021   BUN 17 06/05/2021   CO2 26 06/05/2021   TSH 2.61 12/30/2021    DG Finger Ring Left  Result Date: 04/22/2018 CLINICAL DATA:  Injured the fourth finger 3 weeks ago with pain and swelling of the PIP joint EXAM: LEFT RING FINGER 2+V COMPARISON:  Left hand films of 05/14/2015 FINDINGS: There may be a tiny nondisplaced avulsion from the distal aspect of the proximal phalanx of the left fourth digit with adjacent soft tissue swelling. No other acute abnormality is seen. Mild degenerative changes present involving the DIP and PIP joints IMPRESSION: 1. Questionable small avulsion fragment from the distal aspect of the proximal phalanx of the left fourth digit with soft tissue swelling. 2. Degenerative change of the DIP joints. Electronically Signed   By: Ivar Drape M.D.   On: 04/22/2018 08:46    Assessment & Plan:   Maria Montgomery was seen today for hypothyroidism.  Diagnoses and all orders for this visit:  Acquired hypothyroidism- Her TSH is in the normal range.  She will stay on the current T4 dosage. -     levothyroxine (SYNTHROID) 50 MCG tablet; Take 1 tablet (50 mcg total) by mouth daily before breakfast.  Postoperative hypothyroidism -     TSH; Future -     TSH -     levothyroxine  (SYNTHROID) 50 MCG tablet; Take 1 tablet (50 mcg total) by mouth daily before breakfast.  Primary insomnia -     zolpidem (AMBIEN) 5 MG tablet; Take 1 tablet (5 mg total) by mouth at bedtime as needed for sleep.   I have discontinued Maria Montgomery "Maria Montgomery"'s doxycycline. I am also having her maintain her Cetirizine HCl, mometasone, azelastine, BIOTIN PO, Cholecalciferol (VITAMIN D  PO), fluticasone, montelukast, EPINEPHrine, UNABLE TO FIND, rosuvastatin, Multiple Vitamins-Minerals (ZINC PO), estradiol, zolpidem, and levothyroxine.  Meds ordered this encounter  Medications   zolpidem (AMBIEN) 5 MG tablet    Sig: Take 1 tablet (5 mg total) by mouth at bedtime as needed for sleep.    Dispense:  90 tablet    Refill:  1   levothyroxine (SYNTHROID) 50 MCG tablet    Sig: Take 1 tablet (50 mcg total) by mouth daily before breakfast.    Dispense:  90 tablet    Refill:  1     Follow-up: Return in about 6 months (around 06/29/2022).  Scarlette Calico, MD

## 2021-12-31 LAB — TSH: TSH: 2.61 u[IU]/mL (ref 0.35–5.50)

## 2021-12-31 MED ORDER — LEVOTHYROXINE SODIUM 50 MCG PO TABS: 50.0000 ug | ORAL_TABLET | Freq: Every day | ORAL | 1 refills | Status: DC

## 2022-01-01 NOTE — Progress Notes (Signed)
? ?  Maria Montgomery 1945/12/15 [id number] ? ? ?History:  76 y.o. [id number] presents for breast and pelvic exam. Postmenopausal. Using vaginal estrogen cream 1-2 times per week. S/P 1988 TVH for prolapse. Normal pap history. Hypothyroidism and Vitamin D deficiency managed by PCP. ? ?Gynecologic History ?Patient's last menstrual period was 07/28/1987. ?  ?Contraception: status post hysterectomy ?Sexually active: Yes, infrequently ? ?Health Maintenance ?Last Pap: 04/10/2014. Results were: Normal ?Last mammogram: 04/26/2021. Results were: Normal ?Last colonoscopy: 06/16/2012. Results were: Normal, 10-year recall ?Last Dexa: 08/06/2020. Results were: T-score -1.5, FRAX 12% / 2.4% ? ?Past medical history, past surgical history, family history and social history were all reviewed and documented in the EPIC chart. Married. Son lives near [place] - has 2 daughters and 3 stepchildren. Daughter lives in [place], [place] - has 3 sons.  ? ?ROS:  A ROS was performed and pertinent positives and negatives are included. ? ?Exam: ? ?Vitals:  ? 01/02/22 0851  ?BP: 116/70  ?Weight: 135 lb (61.2 kg)  ?Height: 4\' 11"  (1.499 m)  ? ?Body mass index is 27.27 kg/m?. ? ?General appearance:  Normal ?Thyroid:  Symmetrical, normal in size, without palpable masses or nodularity. ?Respiratory ? Auscultation:  Clear without wheezing or rhonchi ?Cardiovascular ? Auscultation:  Regular rate, without rubs, murmurs or gallops ? Edema/varicosities:  Not grossly evident ?Abdominal ? Soft,nontender, without masses, guarding or rebound. ? Liver/spleen:  No organomegaly noted ? Hernia:  None appreciated ? Skin ? Inspection:  Grossly normal ?Breasts: Examined lying and sitting.  ? Right: Without masses, retractions, nipple discharge or axillary adenopathy. ? ? Left: Without masses, retractions, nipple discharge or axillary adenopathy. ?Genitourinary  ? Inguinal/mons:  Normal without inguinal adenopathy ? External genitalia:  Normal appearing vulva with no masses,  tenderness, or lesions ? BUS/Urethra/Skene's glands:  Normal ? Vagina:  Normal appearing with normal color and discharge, no lesions. Mild atrophic changes ? Cervix:  Absent ? Uterus:  Absent ? Adnexa/parametria:   ?  Rt: Normal in size, without masses or tenderness. ?  Lt: Normal in size, without masses or tenderness. ? Anus and perineum: Normal ? Digital rectal exam: Normal sphincter tone without palpated masses or tenderness ? ?Patient informed chaperone available to be present for breast and pelvic exam. Patient has requested no chaperone to be present. Patient has been advised what will be completed during breast and pelvic exam.  ? ?Assessment/Plan:  76 y.o. [name] for breast and pelvic exam.  ? ?Well female exam with routine gynecological exam - Education provided on SBEs, importance of preventative screenings, current guidelines, high calcium diet, regular exercise, and multivitamin daily.  Labs with PCP.  ? ?Postmenopausal - Plan: DG Bone Density. S/P 1988 TVH for prolapse. ? ?Osteopenia of multiple sites - Plan: DG Bone Density. T-score -1.5 without elevated FRAX 08/2020. Continue Vitamin D supplement, high calcium diet, and regular exercise. Will repeat DXA this October.  ? ?Atrophic vaginitis - Using vaginal estrogen 1-2 times per week with good management. Does not need refill at this time.  ? ?Screening for cervical cancer - Normal Pap history. No longer screening per guidelines.  ? ?Screening for breast cancer - Normal mammogram history.  Continue annual screenings.  Normal breast exam today. ? ?Screening for colon cancer - 2013 colonoscopy. Will repeat this year per GI's recommendation.  ? ?Return in 1 year for medication follow up (vaginal estrogen). ? ? ? ? ?Tamela Gammon DNP, 9:52 AM 01/02/2022 ? ?

## 2022-01-02 ENCOUNTER — Encounter: Payer: Self-pay | Admitting: Nurse Practitioner

## 2022-01-02 ENCOUNTER — Ambulatory Visit (INDEPENDENT_AMBULATORY_CARE_PROVIDER_SITE_OTHER): Payer: PPO | Admitting: Nurse Practitioner

## 2022-01-02 ENCOUNTER — Other Ambulatory Visit: Payer: Self-pay

## 2022-01-02 ENCOUNTER — Ambulatory Visit: Payer: PPO | Admitting: Nurse Practitioner

## 2022-01-02 VITALS — BP 116/70 | Ht 59.0 in | Wt 135.0 lb

## 2022-01-02 DIAGNOSIS — N952 Postmenopausal atrophic vaginitis: Secondary | ICD-10-CM

## 2022-01-02 DIAGNOSIS — Z780 Asymptomatic menopausal state: Secondary | ICD-10-CM | POA: Diagnosis not present

## 2022-01-02 DIAGNOSIS — M8589 Other specified disorders of bone density and structure, multiple sites: Secondary | ICD-10-CM

## 2022-01-02 DIAGNOSIS — Z01419 Encounter for gynecological examination (general) (routine) without abnormal findings: Secondary | ICD-10-CM

## 2022-01-02 NOTE — Addendum Note (Signed)
Addended byMarny Lowenstein on: 01/02/2022 12:42 PM ? ? Modules accepted: Level of Service ? ?

## 2022-01-06 DIAGNOSIS — J301 Allergic rhinitis due to pollen: Secondary | ICD-10-CM | POA: Diagnosis not present

## 2022-01-06 DIAGNOSIS — J3089 Other allergic rhinitis: Secondary | ICD-10-CM | POA: Diagnosis not present

## 2022-01-08 DIAGNOSIS — L570 Actinic keratosis: Secondary | ICD-10-CM | POA: Diagnosis not present

## 2022-01-08 DIAGNOSIS — D692 Other nonthrombocytopenic purpura: Secondary | ICD-10-CM | POA: Diagnosis not present

## 2022-01-08 DIAGNOSIS — L821 Other seborrheic keratosis: Secondary | ICD-10-CM | POA: Diagnosis not present

## 2022-01-08 DIAGNOSIS — Z85828 Personal history of other malignant neoplasm of skin: Secondary | ICD-10-CM | POA: Diagnosis not present

## 2022-01-08 DIAGNOSIS — L438 Other lichen planus: Secondary | ICD-10-CM | POA: Diagnosis not present

## 2022-01-22 DIAGNOSIS — J301 Allergic rhinitis due to pollen: Secondary | ICD-10-CM | POA: Diagnosis not present

## 2022-01-22 DIAGNOSIS — J3089 Other allergic rhinitis: Secondary | ICD-10-CM | POA: Diagnosis not present

## 2022-01-22 DIAGNOSIS — J3081 Allergic rhinitis due to animal (cat) (dog) hair and dander: Secondary | ICD-10-CM | POA: Diagnosis not present

## 2022-01-30 ENCOUNTER — Ambulatory Visit: Payer: PPO | Admitting: Internal Medicine

## 2022-02-01 DIAGNOSIS — J3089 Other allergic rhinitis: Secondary | ICD-10-CM | POA: Diagnosis not present

## 2022-02-13 DIAGNOSIS — J301 Allergic rhinitis due to pollen: Secondary | ICD-10-CM | POA: Diagnosis not present

## 2022-02-13 DIAGNOSIS — J3089 Other allergic rhinitis: Secondary | ICD-10-CM | POA: Diagnosis not present

## 2022-02-18 ENCOUNTER — Other Ambulatory Visit: Payer: Self-pay | Admitting: Internal Medicine

## 2022-02-18 DIAGNOSIS — E890 Postprocedural hypothyroidism: Secondary | ICD-10-CM

## 2022-02-18 DIAGNOSIS — E039 Hypothyroidism, unspecified: Secondary | ICD-10-CM

## 2022-02-19 ENCOUNTER — Other Ambulatory Visit: Payer: Self-pay | Admitting: Internal Medicine

## 2022-02-19 DIAGNOSIS — E785 Hyperlipidemia, unspecified: Secondary | ICD-10-CM

## 2022-03-03 DIAGNOSIS — J301 Allergic rhinitis due to pollen: Secondary | ICD-10-CM | POA: Diagnosis not present

## 2022-03-03 DIAGNOSIS — J3089 Other allergic rhinitis: Secondary | ICD-10-CM | POA: Diagnosis not present

## 2022-03-03 DIAGNOSIS — J3081 Allergic rhinitis due to animal (cat) (dog) hair and dander: Secondary | ICD-10-CM | POA: Diagnosis not present

## 2022-03-21 DIAGNOSIS — J301 Allergic rhinitis due to pollen: Secondary | ICD-10-CM | POA: Diagnosis not present

## 2022-03-24 DIAGNOSIS — J3081 Allergic rhinitis due to animal (cat) (dog) hair and dander: Secondary | ICD-10-CM | POA: Diagnosis not present

## 2022-03-24 DIAGNOSIS — J301 Allergic rhinitis due to pollen: Secondary | ICD-10-CM | POA: Diagnosis not present

## 2022-03-24 DIAGNOSIS — J3089 Other allergic rhinitis: Secondary | ICD-10-CM | POA: Diagnosis not present

## 2022-04-02 DIAGNOSIS — J3081 Allergic rhinitis due to animal (cat) (dog) hair and dander: Secondary | ICD-10-CM | POA: Diagnosis not present

## 2022-04-02 DIAGNOSIS — J3089 Other allergic rhinitis: Secondary | ICD-10-CM | POA: Diagnosis not present

## 2022-04-02 DIAGNOSIS — J301 Allergic rhinitis due to pollen: Secondary | ICD-10-CM | POA: Diagnosis not present

## 2022-04-14 DIAGNOSIS — J301 Allergic rhinitis due to pollen: Secondary | ICD-10-CM | POA: Diagnosis not present

## 2022-04-14 DIAGNOSIS — J3089 Other allergic rhinitis: Secondary | ICD-10-CM | POA: Diagnosis not present

## 2022-04-14 DIAGNOSIS — J3081 Allergic rhinitis due to animal (cat) (dog) hair and dander: Secondary | ICD-10-CM | POA: Diagnosis not present

## 2022-04-25 DIAGNOSIS — J3089 Other allergic rhinitis: Secondary | ICD-10-CM | POA: Diagnosis not present

## 2022-04-25 DIAGNOSIS — J3081 Allergic rhinitis due to animal (cat) (dog) hair and dander: Secondary | ICD-10-CM | POA: Diagnosis not present

## 2022-04-25 DIAGNOSIS — J301 Allergic rhinitis due to pollen: Secondary | ICD-10-CM | POA: Diagnosis not present

## 2022-04-28 ENCOUNTER — Encounter: Payer: Self-pay | Admitting: Internal Medicine

## 2022-04-28 DIAGNOSIS — J3089 Other allergic rhinitis: Secondary | ICD-10-CM | POA: Diagnosis not present

## 2022-04-28 DIAGNOSIS — J301 Allergic rhinitis due to pollen: Secondary | ICD-10-CM | POA: Diagnosis not present

## 2022-04-28 DIAGNOSIS — Z1231 Encounter for screening mammogram for malignant neoplasm of breast: Secondary | ICD-10-CM | POA: Diagnosis not present

## 2022-04-28 DIAGNOSIS — J3081 Allergic rhinitis due to animal (cat) (dog) hair and dander: Secondary | ICD-10-CM | POA: Diagnosis not present

## 2022-05-12 DIAGNOSIS — J3081 Allergic rhinitis due to animal (cat) (dog) hair and dander: Secondary | ICD-10-CM | POA: Diagnosis not present

## 2022-05-12 DIAGNOSIS — J301 Allergic rhinitis due to pollen: Secondary | ICD-10-CM | POA: Diagnosis not present

## 2022-05-12 DIAGNOSIS — J3089 Other allergic rhinitis: Secondary | ICD-10-CM | POA: Diagnosis not present

## 2022-05-15 DIAGNOSIS — J301 Allergic rhinitis due to pollen: Secondary | ICD-10-CM | POA: Diagnosis not present

## 2022-05-15 DIAGNOSIS — J3081 Allergic rhinitis due to animal (cat) (dog) hair and dander: Secondary | ICD-10-CM | POA: Diagnosis not present

## 2022-05-15 DIAGNOSIS — J3089 Other allergic rhinitis: Secondary | ICD-10-CM | POA: Diagnosis not present

## 2022-05-28 DIAGNOSIS — J301 Allergic rhinitis due to pollen: Secondary | ICD-10-CM | POA: Diagnosis not present

## 2022-05-28 DIAGNOSIS — J3089 Other allergic rhinitis: Secondary | ICD-10-CM | POA: Diagnosis not present

## 2022-06-11 ENCOUNTER — Encounter: Payer: Self-pay | Admitting: Internal Medicine

## 2022-06-11 ENCOUNTER — Ambulatory Visit (INDEPENDENT_AMBULATORY_CARE_PROVIDER_SITE_OTHER): Payer: PPO | Admitting: Internal Medicine

## 2022-06-11 VITALS — BP 124/80 | HR 74 | Temp 98.0°F | Ht 59.0 in | Wt 133.2 lb

## 2022-06-11 DIAGNOSIS — F5101 Primary insomnia: Secondary | ICD-10-CM | POA: Diagnosis not present

## 2022-06-11 DIAGNOSIS — E039 Hypothyroidism, unspecified: Secondary | ICD-10-CM

## 2022-06-11 DIAGNOSIS — E559 Vitamin D deficiency, unspecified: Secondary | ICD-10-CM

## 2022-06-11 DIAGNOSIS — Z0001 Encounter for general adult medical examination with abnormal findings: Secondary | ICD-10-CM | POA: Diagnosis not present

## 2022-06-11 DIAGNOSIS — M8580 Other specified disorders of bone density and structure, unspecified site: Secondary | ICD-10-CM

## 2022-06-11 DIAGNOSIS — E785 Hyperlipidemia, unspecified: Secondary | ICD-10-CM | POA: Diagnosis not present

## 2022-06-11 DIAGNOSIS — E890 Postprocedural hypothyroidism: Secondary | ICD-10-CM | POA: Diagnosis not present

## 2022-06-11 DIAGNOSIS — H00012 Hordeolum externum right lower eyelid: Secondary | ICD-10-CM | POA: Insufficient documentation

## 2022-06-11 DIAGNOSIS — H00015 Hordeolum externum left lower eyelid: Secondary | ICD-10-CM | POA: Insufficient documentation

## 2022-06-11 LAB — CBC WITH DIFFERENTIAL/PLATELET
Basophils Absolute: 0.1 10*3/uL (ref 0.0–0.1)
Basophils Relative: 0.7 % (ref 0.0–3.0)
Eosinophils Absolute: 0.1 10*3/uL (ref 0.0–0.7)
Eosinophils Relative: 1.4 % (ref 0.0–5.0)
HCT: 41.3 % (ref 36.0–46.0)
Hemoglobin: 13.8 g/dL (ref 12.0–15.0)
Lymphocytes Relative: 25.2 % (ref 12.0–46.0)
Lymphs Abs: 1.7 10*3/uL (ref 0.7–4.0)
MCHC: 33.5 g/dL (ref 30.0–36.0)
MCV: 94.1 fl (ref 78.0–100.0)
Monocytes Absolute: 0.9 10*3/uL (ref 0.1–1.0)
Monocytes Relative: 13.2 % — ABNORMAL HIGH (ref 3.0–12.0)
Neutro Abs: 4 10*3/uL (ref 1.4–7.7)
Neutrophils Relative %: 59.5 % (ref 43.0–77.0)
Platelets: 244 10*3/uL (ref 150.0–400.0)
RBC: 4.39 Mil/uL (ref 3.87–5.11)
RDW: 12.7 % (ref 11.5–15.5)
WBC: 6.8 10*3/uL (ref 4.0–10.5)

## 2022-06-11 LAB — BASIC METABOLIC PANEL
BUN: 18 mg/dL (ref 6–23)
CO2: 28 mEq/L (ref 19–32)
Calcium: 9.5 mg/dL (ref 8.4–10.5)
Chloride: 103 mEq/L (ref 96–112)
Creatinine, Ser: 0.9 mg/dL (ref 0.40–1.20)
GFR: 61.28 mL/min (ref >60.00)
Glucose, Bld: 102 mg/dL — ABNORMAL HIGH (ref 70–99)
Potassium: 4.6 mEq/L (ref 3.5–5.1)
Sodium: 138 mEq/L (ref 135–145)

## 2022-06-11 LAB — HEPATIC FUNCTION PANEL
ALT: 20 U/L (ref 0–35)
AST: 22 U/L (ref 0–37)
Albumin: 4.4 g/dL (ref 3.5–5.2)
Alkaline Phosphatase: 53 U/L (ref 39–117)
Bilirubin, Direct: 0.2 mg/dL (ref 0.0–0.3)
Total Bilirubin: 0.8 mg/dL (ref 0.2–1.2)
Total Protein: 7.2 g/dL (ref 6.0–8.3)

## 2022-06-11 LAB — LIPID PANEL
Cholesterol: 174 mg/dL (ref 0–200)
HDL: 89.4 mg/dL (ref >39.00)
LDL Cholesterol: 71 mg/dL (ref 0–99)
NonHDL: 84.57
Total CHOL/HDL Ratio: 2
Triglycerides: 68 mg/dL (ref 0.0–149.0)
VLDL: 13.6 mg/dL (ref 0.0–40.0)

## 2022-06-11 LAB — TSH: TSH: 3.34 u[IU]/mL (ref 0.35–5.50)

## 2022-06-11 LAB — VITAMIN D 25 HYDROXY (VIT D DEFICIENCY, FRACTURES): VITD: 33.93 ng/mL (ref 30.00–100.00)

## 2022-06-11 MED ORDER — ZOLPIDEM TARTRATE 5 MG PO TABS: 5.0000 mg | ORAL_TABLET | Freq: Every evening | ORAL | 1 refills | Status: DC | PRN

## 2022-06-11 MED ORDER — LEVOTHYROXINE SODIUM 50 MCG PO TABS: ORAL_TABLET | ORAL | 1 refills | Status: DC

## 2022-06-11 MED ORDER — SULFAMETHOXAZOLE-TRIMETHOPRIM 800-160 MG PO TABS: 1.0000 | ORAL_TABLET | Freq: Two times a day (BID) | ORAL | 0 refills | Status: AC

## 2022-06-11 NOTE — Patient Instructions (Signed)

## 2022-06-11 NOTE — Progress Notes (Signed)
Subjective:  Patient ID: Maria Montgomery, female    DOB: 10/25/1946  Age: 76 y.o. MRN: [id number]  CC: Annual Exam, Hypothyroidism, Hyperlipidemia, and Eye Pain   HPI Maria Montgomery presents for a CPX and f/up -  She complains of a 2-day history of left lower eyelid pain.  She denies swelling, discharge, redness, or drainage.  She is active and denies chest pain, shortness of breath, diaphoresis, edema, or fatigue.  Outpatient Medications Prior to Visit  Medication Sig Dispense Refill   azelastine (OPTIVAR) 0.05 % ophthalmic solution Place 1 drop into both eyes 2 (two) times daily. 6 mL 5   BIOTIN PO Take 10,000 mg by mouth.     Cetirizine HCl 10 MG CAPS Take by mouth as needed. Reported on 11/21/2015     Cholecalciferol (VITAMIN D PO) Take 5,000 Int'l Units by mouth.     EPINEPHrine 0.3 mg/0.3 mL IJ SOAJ injection      estradiol (ESTRACE) 0.1 MG/GM vaginal cream USE 0.5 GRAM VAGINALLY 2 TIMES A WEEK 42.5 g 0   fluticasone (FLONASE) 50 MCG/ACT nasal spray SHAKE LQ AND U 1 TO 2 SPRAYS IEN QD     mometasone (ELOCON) 0.1 % cream Apply 1 application topically 2 (two) times daily. 45 g 0   montelukast (SINGULAIR) 10 MG tablet Take 10 mg by mouth at bedtime as needed.     Multiple Vitamins-Minerals (ZINC PO) Take by mouth.     rosuvastatin (CRESTOR) 5 MG tablet TAKE 1 TABLET(5 MG) BY MOUTH DAILY 90 tablet 1   UNABLE TO FIND Allergy shots 2 shots every 2 weeks     levothyroxine (SYNTHROID) 50 MCG tablet TAKE 1 TABLET(50 MCG) BY MOUTH DAILY BEFORE BREAKFAST 90 tablet 1   zolpidem (AMBIEN) 5 MG tablet Take 1 tablet (5 mg total) by mouth at bedtime as needed for sleep. 90 tablet 1   No facility-administered medications prior to visit.    ROS Review of Systems  Constitutional: Negative.  Negative for chills, diaphoresis and fatigue.  Eyes:  Positive for pain. Negative for photophobia, discharge and redness.  Respiratory:  Negative for cough, chest tightness and wheezing.   Cardiovascular:   Negative for chest pain, palpitations and leg swelling.  Gastrointestinal:  Negative for abdominal pain and constipation.  Endocrine: Negative.   Genitourinary:  Negative for difficulty urinating.  Musculoskeletal: Negative.  Negative for arthralgias and myalgias.  Skin: Negative.  Negative for rash.  Allergic/Immunologic: Negative.   Neurological: Negative.   Hematological:  Negative for adenopathy. Does not bruise/bleed easily.  Psychiatric/Behavioral:  Positive for sleep disturbance. The patient is not nervous/anxious.     Objective:  BP 124/80 (BP Location: Right Arm, Patient Position: Sitting, Cuff Size: Small)   Pulse 74   Temp 98 F (36.7 C) (Oral)   Ht '4\' 11"'$  (1.499 m)   Wt 133 lb 4 oz (60.4 kg)   LMP 07/28/1987   SpO2 97%   BMI 26.91 kg/m   BP Readings from Last 3 Encounters:  06/11/22 124/80  01/02/22 116/70  12/30/21 138/84    Wt Readings from Last 3 Encounters:  06/11/22 133 lb 4 oz (60.4 kg)  01/02/22 135 lb (61.2 kg)  12/30/21 136 lb (61.7 kg)    Physical Exam Vitals reviewed.  HENT:     Nose: Nose normal.     Mouth/Throat:     Mouth: Mucous membranes are moist.  Eyes:     General: Lids are everted, no foreign bodies appreciated. No scleral  icterus.       Right eye: No foreign body, discharge or hordeolum.        Left eye: Hordeolum present.No foreign body or discharge.     Conjunctiva/sclera: Conjunctivae normal.     Right eye: Right conjunctiva is not injected. No chemosis, exudate or hemorrhage.    Left eye: Left conjunctiva is not injected. No chemosis, exudate or hemorrhage. Cardiovascular:     Rate and Rhythm: Normal rate and regular rhythm.     Heart sounds: No murmur heard. Pulmonary:     Effort: Pulmonary effort is normal.     Breath sounds: No stridor. No wheezing, rhonchi or rales.  Abdominal:     General: Abdomen is flat.     Palpations: There is no mass.     Tenderness: There is no abdominal tenderness. There is no guarding.      Hernia: No hernia is present.  Musculoskeletal:        General: No swelling. Normal range of motion.     Cervical back: Normal range of motion and neck supple.     Right lower leg: No edema.     Left lower leg: No edema.  Lymphadenopathy:     Cervical: No cervical adenopathy.  Skin:    General: Skin is warm and dry.     Findings: No rash.  Neurological:     General: No focal deficit present.     Mental Status: She is alert. Mental status is at baseline.  Psychiatric:        Mood and Affect: Mood normal.        Behavior: Behavior normal.     Lab Results  Component Value Date   WBC 6.8 06/11/2022   HGB 13.8 06/11/2022   HCT 41.3 06/11/2022   PLT 244.0 06/11/2022   GLUCOSE 102 (H) 06/11/2022   CHOL 174 06/11/2022   TRIG 68.0 06/11/2022   HDL 89.40 06/11/2022   LDLDIRECT 97.6 02/05/2009   LDLCALC 71 06/11/2022   ALT 20 06/11/2022   AST 22 06/11/2022   NA 138 06/11/2022   K 4.6 06/11/2022   CL 103 06/11/2022   CREATININE 0.90 06/11/2022   BUN 18 06/11/2022   CO2 28 06/11/2022   TSH 3.34 06/11/2022    DG Finger Ring Left  Result Date: 04/22/2018 CLINICAL DATA:  Injured the fourth finger 3 weeks ago with pain and swelling of the PIP joint EXAM: LEFT RING FINGER 2+V COMPARISON:  Left hand films of 05/14/2015 FINDINGS: There may be a tiny nondisplaced avulsion from the distal aspect of the proximal phalanx of the left fourth digit with adjacent soft tissue swelling. No other acute abnormality is seen. Mild degenerative changes present involving the DIP and PIP joints IMPRESSION: 1. Questionable small avulsion fragment from the distal aspect of the proximal phalanx of the left fourth digit with soft tissue swelling. 2. Degenerative change of the DIP joints. Electronically Signed   By: Ivar Drape M.D.   On: 04/22/2018 08:46    Assessment & Plan:   Maria Montgomery was seen today for annual exam, hypothyroidism, hyperlipidemia and eye pain.  Diagnoses and all orders for this  visit:  Acquired hypothyroidism- She is euthyroid. -     CBC with Differential/Platelet; Future -     CBC with Differential/Platelet -     levothyroxine (SYNTHROID) 50 MCG tablet; TAKE 1 TABLET(50 MCG) BY MOUTH DAILY BEFORE BREAKFAST  Hyperlipidemia LDL goal <100- LDL goal achieved. Doing well on the statin  -  Lipid panel; Future -     Hepatic function panel; Future -     TSH; Future -     TSH -     Hepatic function panel -     Lipid panel  Vitamin D deficiency -     VITAMIN D 25 Hydroxy (Vit-D Deficiency, Fractures); Future -     VITAMIN D 25 Hydroxy (Vit-D Deficiency, Fractures)  Osteopenia, senile -     Basic metabolic panel; Future -     VITAMIN D 25 Hydroxy (Vit-D Deficiency, Fractures); Future -     VITAMIN D 25 Hydroxy (Vit-D Deficiency, Fractures) -     Basic metabolic panel  Primary insomnia -     zolpidem (AMBIEN) 5 MG tablet; Take 1 tablet (5 mg total) by mouth at bedtime as needed for sleep.  Hordeolum externum of left lower eyelid -     sulfamethoxazole-trimethoprim (BACTRIM DS) 800-160 MG tablet; Take 1 tablet by mouth 2 (two) times daily for 7 days.  Postoperative hypothyroidism -     levothyroxine (SYNTHROID) 50 MCG tablet; TAKE 1 TABLET(50 MCG) BY MOUTH DAILY BEFORE BREAKFAST   I am having Maria Montgomery "Ellsworth Lennox" start on sulfamethoxazole-trimethoprim. I am also having her maintain her Cetirizine HCl, mometasone, azelastine, BIOTIN PO, Cholecalciferol (VITAMIN D PO), fluticasone, montelukast, EPINEPHrine, UNABLE TO FIND, Multiple Vitamins-Minerals (ZINC PO), estradiol, rosuvastatin, zolpidem, and levothyroxine.  Meds ordered this encounter  Medications   zolpidem (AMBIEN) 5 MG tablet    Sig: Take 1 tablet (5 mg total) by mouth at bedtime as needed for sleep.    Dispense:  90 tablet    Refill:  1   sulfamethoxazole-trimethoprim (BACTRIM DS) 800-160 MG tablet    Sig: Take 1 tablet by mouth 2 (two) times daily for 7 days.    Dispense:  14 tablet     Refill:  0   levothyroxine (SYNTHROID) 50 MCG tablet    Sig: TAKE 1 TABLET(50 MCG) BY MOUTH DAILY BEFORE BREAKFAST    Dispense:  90 tablet    Refill:  1     Follow-up: Return in about 6 months (around 12/12/2022).  Scarlette Calico, MD

## 2022-06-12 DIAGNOSIS — J3081 Allergic rhinitis due to animal (cat) (dog) hair and dander: Secondary | ICD-10-CM | POA: Diagnosis not present

## 2022-06-12 DIAGNOSIS — J301 Allergic rhinitis due to pollen: Secondary | ICD-10-CM | POA: Diagnosis not present

## 2022-06-12 DIAGNOSIS — J3089 Other allergic rhinitis: Secondary | ICD-10-CM | POA: Diagnosis not present

## 2022-06-13 DIAGNOSIS — Z0001 Encounter for general adult medical examination with abnormal findings: Secondary | ICD-10-CM | POA: Insufficient documentation

## 2022-06-13 NOTE — Assessment & Plan Note (Signed)
Exam completed Labs reviewed Vaccines are up-to-date No cancer screenings indicated Patient education was given

## 2022-06-26 DIAGNOSIS — J3081 Allergic rhinitis due to animal (cat) (dog) hair and dander: Secondary | ICD-10-CM | POA: Diagnosis not present

## 2022-06-26 DIAGNOSIS — J3089 Other allergic rhinitis: Secondary | ICD-10-CM | POA: Diagnosis not present

## 2022-06-26 DIAGNOSIS — J301 Allergic rhinitis due to pollen: Secondary | ICD-10-CM | POA: Diagnosis not present

## 2022-07-08 DIAGNOSIS — H52202 Unspecified astigmatism, left eye: Secondary | ICD-10-CM | POA: Diagnosis not present

## 2022-07-08 DIAGNOSIS — H524 Presbyopia: Secondary | ICD-10-CM | POA: Diagnosis not present

## 2022-07-08 DIAGNOSIS — H5212 Myopia, left eye: Secondary | ICD-10-CM | POA: Diagnosis not present

## 2022-07-08 DIAGNOSIS — Z961 Presence of intraocular lens: Secondary | ICD-10-CM | POA: Diagnosis not present

## 2022-07-08 DIAGNOSIS — H353131 Nonexudative age-related macular degeneration, bilateral, early dry stage: Secondary | ICD-10-CM | POA: Diagnosis not present

## 2022-07-14 DIAGNOSIS — J3081 Allergic rhinitis due to animal (cat) (dog) hair and dander: Secondary | ICD-10-CM | POA: Diagnosis not present

## 2022-07-14 DIAGNOSIS — J301 Allergic rhinitis due to pollen: Secondary | ICD-10-CM | POA: Diagnosis not present

## 2022-07-14 DIAGNOSIS — J3089 Other allergic rhinitis: Secondary | ICD-10-CM | POA: Diagnosis not present

## 2022-07-31 DIAGNOSIS — J301 Allergic rhinitis due to pollen: Secondary | ICD-10-CM | POA: Diagnosis not present

## 2022-07-31 DIAGNOSIS — J3089 Other allergic rhinitis: Secondary | ICD-10-CM | POA: Diagnosis not present

## 2022-08-06 DIAGNOSIS — J3081 Allergic rhinitis due to animal (cat) (dog) hair and dander: Secondary | ICD-10-CM | POA: Diagnosis not present

## 2022-08-06 DIAGNOSIS — J301 Allergic rhinitis due to pollen: Secondary | ICD-10-CM | POA: Diagnosis not present

## 2022-08-06 DIAGNOSIS — J3089 Other allergic rhinitis: Secondary | ICD-10-CM | POA: Diagnosis not present

## 2022-08-14 DIAGNOSIS — M8589 Other specified disorders of bone density and structure, multiple sites: Secondary | ICD-10-CM | POA: Diagnosis not present

## 2022-08-14 LAB — HM DEXA SCAN

## 2022-08-15 DIAGNOSIS — J3081 Allergic rhinitis due to animal (cat) (dog) hair and dander: Secondary | ICD-10-CM | POA: Diagnosis not present

## 2022-08-15 DIAGNOSIS — J3089 Other allergic rhinitis: Secondary | ICD-10-CM | POA: Diagnosis not present

## 2022-08-18 DIAGNOSIS — J3089 Other allergic rhinitis: Secondary | ICD-10-CM | POA: Diagnosis not present

## 2022-08-18 DIAGNOSIS — J301 Allergic rhinitis due to pollen: Secondary | ICD-10-CM | POA: Diagnosis not present

## 2022-08-18 DIAGNOSIS — J3081 Allergic rhinitis due to animal (cat) (dog) hair and dander: Secondary | ICD-10-CM | POA: Diagnosis not present

## 2022-08-25 DIAGNOSIS — J3081 Allergic rhinitis due to animal (cat) (dog) hair and dander: Secondary | ICD-10-CM | POA: Diagnosis not present

## 2022-08-25 DIAGNOSIS — J301 Allergic rhinitis due to pollen: Secondary | ICD-10-CM | POA: Diagnosis not present

## 2022-08-25 DIAGNOSIS — J3089 Other allergic rhinitis: Secondary | ICD-10-CM | POA: Diagnosis not present

## 2022-08-27 ENCOUNTER — Encounter: Payer: Self-pay | Admitting: Internal Medicine

## 2022-09-01 DIAGNOSIS — J3089 Other allergic rhinitis: Secondary | ICD-10-CM | POA: Diagnosis not present

## 2022-09-01 DIAGNOSIS — J301 Allergic rhinitis due to pollen: Secondary | ICD-10-CM | POA: Diagnosis not present

## 2022-09-01 DIAGNOSIS — J3081 Allergic rhinitis due to animal (cat) (dog) hair and dander: Secondary | ICD-10-CM | POA: Diagnosis not present

## 2022-09-08 DIAGNOSIS — J301 Allergic rhinitis due to pollen: Secondary | ICD-10-CM | POA: Diagnosis not present

## 2022-09-08 DIAGNOSIS — J3081 Allergic rhinitis due to animal (cat) (dog) hair and dander: Secondary | ICD-10-CM | POA: Diagnosis not present

## 2022-09-08 DIAGNOSIS — J3089 Other allergic rhinitis: Secondary | ICD-10-CM | POA: Diagnosis not present

## 2022-09-10 DIAGNOSIS — H1045 Other chronic allergic conjunctivitis: Secondary | ICD-10-CM | POA: Diagnosis not present

## 2022-09-10 DIAGNOSIS — J301 Allergic rhinitis due to pollen: Secondary | ICD-10-CM | POA: Diagnosis not present

## 2022-09-10 DIAGNOSIS — L309 Dermatitis, unspecified: Secondary | ICD-10-CM | POA: Diagnosis not present

## 2022-09-10 DIAGNOSIS — J45991 Cough variant asthma: Secondary | ICD-10-CM | POA: Diagnosis not present

## 2022-09-10 DIAGNOSIS — J3089 Other allergic rhinitis: Secondary | ICD-10-CM | POA: Diagnosis not present

## 2022-09-15 DIAGNOSIS — J3089 Other allergic rhinitis: Secondary | ICD-10-CM | POA: Diagnosis not present

## 2022-09-15 DIAGNOSIS — J3081 Allergic rhinitis due to animal (cat) (dog) hair and dander: Secondary | ICD-10-CM | POA: Diagnosis not present

## 2022-09-15 DIAGNOSIS — J301 Allergic rhinitis due to pollen: Secondary | ICD-10-CM | POA: Diagnosis not present

## 2022-09-29 DIAGNOSIS — J301 Allergic rhinitis due to pollen: Secondary | ICD-10-CM | POA: Diagnosis not present

## 2022-09-29 DIAGNOSIS — J3089 Other allergic rhinitis: Secondary | ICD-10-CM | POA: Diagnosis not present

## 2022-09-29 DIAGNOSIS — J3081 Allergic rhinitis due to animal (cat) (dog) hair and dander: Secondary | ICD-10-CM | POA: Diagnosis not present

## 2022-10-01 ENCOUNTER — Telehealth: Payer: Self-pay | Admitting: Internal Medicine

## 2022-10-01 NOTE — Telephone Encounter (Signed)
Left message for patient to call back to schedule Medicare Annual Wellness Visit   Last AWV  10/16/21  Please schedule at anytime with [facility] if patient calls the office back.      Any questions, please call me at [phone]

## 2022-10-01 NOTE — Telephone Encounter (Signed)
Patient is requesting DEXA (bone density) results from October, she states she never received a letter or telephone call in regards to results. Advised patient I would forward a message to clinical staff and someone would call her with results. Please call patient with results.

## 2022-10-06 DIAGNOSIS — J3089 Other allergic rhinitis: Secondary | ICD-10-CM | POA: Diagnosis not present

## 2022-10-06 DIAGNOSIS — J3081 Allergic rhinitis due to animal (cat) (dog) hair and dander: Secondary | ICD-10-CM | POA: Diagnosis not present

## 2022-10-06 DIAGNOSIS — J301 Allergic rhinitis due to pollen: Secondary | ICD-10-CM | POA: Diagnosis not present

## 2022-10-13 NOTE — Telephone Encounter (Signed)
Pt would like to start Prolia.  °

## 2022-10-13 NOTE — Telephone Encounter (Signed)
Pt does not wish to treat at this time.   However, she would like to know what her options are if she changes her mind.   Please advise.

## 2022-10-15 ENCOUNTER — Telehealth: Payer: Self-pay | Admitting: Internal Medicine

## 2022-10-15 NOTE — Telephone Encounter (Signed)
Patient would like to ask you some questions about shots that were recommended  Patient #  [phone]

## 2022-10-15 NOTE — Telephone Encounter (Signed)
Called pt, LVM to discuss.  

## 2022-10-16 ENCOUNTER — Other Ambulatory Visit (HOSPITAL_COMMUNITY): Payer: Self-pay

## 2022-10-16 NOTE — Telephone Encounter (Signed)
Pharmacy Patient Advocate Encounter  Insurance verification completed.    The patient is insured through [place] test claims for: Prolia '60mg'$ .  Pharmacy benefit copay: $160.00

## 2022-10-16 NOTE — Telephone Encounter (Signed)
Forwarding to RX Prior Auth Team 

## 2022-10-16 NOTE — Telephone Encounter (Signed)
Prolia VOB initiated via [email or web address] 

## 2022-10-21 NOTE — Telephone Encounter (Signed)
Pt ready for scheduling on or after 10/21/22  Out-of-pocket cost due at time of visit: $291 (Co-insurance + $15.00 Copay)  Primary: Medicare Prolia co-insurance: 20% (approximately $276) Admin fee co-insurance: 0%  Secondary:  Prolia co-insurance:  Admin fee co-insurance:   Deductible:   Prior Auth:  PA# Valid:   ** This summary of benefits is an estimation of the patient's out-of-pocket cost. Exact cost may vary based on individual plan coverage.

## 2022-10-21 NOTE — Telephone Encounter (Addendum)
Pharmacy benefit is cheaper for the patient. $160.00  You can send rx to [facility] if patient prefers.

## 2022-10-21 NOTE — Telephone Encounter (Signed)
Called [employer], No PA required, 20% coinsurance, no deductible, no admin fee, $15.00 copay.   Reference # [id number]

## 2022-10-22 ENCOUNTER — Other Ambulatory Visit (HOSPITAL_COMMUNITY): Payer: Self-pay

## 2022-10-22 ENCOUNTER — Other Ambulatory Visit: Payer: Self-pay | Admitting: Internal Medicine

## 2022-10-22 DIAGNOSIS — M810 Age-related osteoporosis without current pathological fracture: Secondary | ICD-10-CM

## 2022-10-22 MED ORDER — DENOSUMAB 60 MG/ML ~~LOC~~ SOSY
60.0000 mg | PREFILLED_SYRINGE | Freq: Once | SUBCUTANEOUS | 1 refills | Status: AC
  Filled 2022-10-22: qty 1, 1d supply, fill #0
  Filled 2023-04-29: qty 1, 180d supply, fill #0

## 2022-11-06 ENCOUNTER — Other Ambulatory Visit (HOSPITAL_COMMUNITY): Payer: Self-pay

## 2022-11-06 ENCOUNTER — Other Ambulatory Visit: Payer: Self-pay | Admitting: Internal Medicine

## 2022-11-06 DIAGNOSIS — M810 Age-related osteoporosis without current pathological fracture: Secondary | ICD-10-CM

## 2022-11-06 MED ORDER — DENOSUMAB 60 MG/ML ~~LOC~~ SOSY
60.0000 mg | PREFILLED_SYRINGE | Freq: Once | SUBCUTANEOUS | 0 refills | Status: AC
  Filled 2022-11-06: qty 1, 1d supply, fill #0
  Filled 2022-11-14: qty 1, 180d supply, fill #0
  Filled 2022-11-14: qty 1, 1d supply, fill #0
  Filled 2022-11-14: qty 1, 180d supply, fill #0

## 2022-11-07 ENCOUNTER — Other Ambulatory Visit (HOSPITAL_COMMUNITY): Payer: Self-pay

## 2022-11-07 DIAGNOSIS — J3089 Other allergic rhinitis: Secondary | ICD-10-CM | POA: Diagnosis not present

## 2022-11-07 DIAGNOSIS — J301 Allergic rhinitis due to pollen: Secondary | ICD-10-CM | POA: Diagnosis not present

## 2022-11-14 ENCOUNTER — Other Ambulatory Visit (HOSPITAL_COMMUNITY): Payer: Self-pay

## 2022-11-17 ENCOUNTER — Other Ambulatory Visit (HOSPITAL_COMMUNITY): Payer: Self-pay

## 2022-11-18 ENCOUNTER — Telehealth: Payer: Self-pay | Admitting: *Deleted

## 2022-11-18 NOTE — Telephone Encounter (Signed)
Called patient and left message for patient to call the office to schedule her prolia injection.  $291 (Co-insurance + $15.00 Copay) Due at time of visit.

## 2022-11-19 ENCOUNTER — Ambulatory Visit (INDEPENDENT_AMBULATORY_CARE_PROVIDER_SITE_OTHER): Payer: PPO

## 2022-11-19 DIAGNOSIS — M810 Age-related osteoporosis without current pathological fracture: Secondary | ICD-10-CM

## 2022-11-19 MED ORDER — DENOSUMAB 60 MG/ML ~~LOC~~ SOSY
60.0000 mg | PREFILLED_SYRINGE | Freq: Once | SUBCUTANEOUS | Status: AC
  Administered 2022-11-19: 60 mg via SUBCUTANEOUS

## 2022-11-19 NOTE — Progress Notes (Signed)
After obtaining consent, and per orders of Dr. Ronnald Ramp, injection of Prolia was given in the left arm(subcut) by Marrian Salvage. Patient instructed to report any adverse reaction to me immediately.

## 2022-11-26 DIAGNOSIS — J301 Allergic rhinitis due to pollen: Secondary | ICD-10-CM | POA: Diagnosis not present

## 2022-11-26 DIAGNOSIS — J3089 Other allergic rhinitis: Secondary | ICD-10-CM | POA: Diagnosis not present

## 2022-11-26 DIAGNOSIS — J3081 Allergic rhinitis due to animal (cat) (dog) hair and dander: Secondary | ICD-10-CM | POA: Diagnosis not present

## 2022-12-31 DIAGNOSIS — J301 Allergic rhinitis due to pollen: Secondary | ICD-10-CM | POA: Diagnosis not present

## 2022-12-31 DIAGNOSIS — J3081 Allergic rhinitis due to animal (cat) (dog) hair and dander: Secondary | ICD-10-CM | POA: Diagnosis not present

## 2022-12-31 DIAGNOSIS — J3089 Other allergic rhinitis: Secondary | ICD-10-CM | POA: Diagnosis not present

## 2023-01-12 DIAGNOSIS — D2262 Melanocytic nevi of left upper limb, including shoulder: Secondary | ICD-10-CM | POA: Diagnosis not present

## 2023-01-12 DIAGNOSIS — L821 Other seborrheic keratosis: Secondary | ICD-10-CM | POA: Diagnosis not present

## 2023-01-12 DIAGNOSIS — D224 Melanocytic nevi of scalp and neck: Secondary | ICD-10-CM | POA: Diagnosis not present

## 2023-01-12 DIAGNOSIS — L820 Inflamed seborrheic keratosis: Secondary | ICD-10-CM | POA: Diagnosis not present

## 2023-01-12 DIAGNOSIS — Z85828 Personal history of other malignant neoplasm of skin: Secondary | ICD-10-CM | POA: Diagnosis not present

## 2023-01-12 DIAGNOSIS — D2261 Melanocytic nevi of right upper limb, including shoulder: Secondary | ICD-10-CM | POA: Diagnosis not present

## 2023-01-12 DIAGNOSIS — D225 Melanocytic nevi of trunk: Secondary | ICD-10-CM | POA: Diagnosis not present

## 2023-01-19 DIAGNOSIS — J3089 Other allergic rhinitis: Secondary | ICD-10-CM | POA: Diagnosis not present

## 2023-01-19 DIAGNOSIS — J301 Allergic rhinitis due to pollen: Secondary | ICD-10-CM | POA: Diagnosis not present

## 2023-01-19 DIAGNOSIS — J3081 Allergic rhinitis due to animal (cat) (dog) hair and dander: Secondary | ICD-10-CM | POA: Diagnosis not present

## 2023-02-05 DIAGNOSIS — J3089 Other allergic rhinitis: Secondary | ICD-10-CM | POA: Diagnosis not present

## 2023-02-05 DIAGNOSIS — J301 Allergic rhinitis due to pollen: Secondary | ICD-10-CM | POA: Diagnosis not present

## 2023-02-05 DIAGNOSIS — J3081 Allergic rhinitis due to animal (cat) (dog) hair and dander: Secondary | ICD-10-CM | POA: Diagnosis not present

## 2023-02-09 ENCOUNTER — Other Ambulatory Visit: Payer: Self-pay | Admitting: Internal Medicine

## 2023-02-09 DIAGNOSIS — E890 Postprocedural hypothyroidism: Secondary | ICD-10-CM

## 2023-02-09 DIAGNOSIS — E039 Hypothyroidism, unspecified: Secondary | ICD-10-CM

## 2023-02-13 ENCOUNTER — Encounter: Payer: Self-pay | Admitting: Nurse Practitioner

## 2023-02-19 ENCOUNTER — Encounter: Payer: Self-pay | Admitting: Nurse Practitioner

## 2023-02-19 ENCOUNTER — Ambulatory Visit (INDEPENDENT_AMBULATORY_CARE_PROVIDER_SITE_OTHER): Payer: PPO | Admitting: Nurse Practitioner

## 2023-02-19 VITALS — BP 102/62 | HR 71 | Ht 60.0 in | Wt 138.0 lb

## 2023-02-19 DIAGNOSIS — Z9189 Other specified personal risk factors, not elsewhere classified: Secondary | ICD-10-CM

## 2023-02-19 DIAGNOSIS — M8589 Other specified disorders of bone density and structure, multiple sites: Secondary | ICD-10-CM | POA: Diagnosis not present

## 2023-02-19 DIAGNOSIS — L309 Dermatitis, unspecified: Secondary | ICD-10-CM

## 2023-02-19 DIAGNOSIS — N952 Postmenopausal atrophic vaginitis: Secondary | ICD-10-CM | POA: Diagnosis not present

## 2023-02-19 MED ORDER — ESTRADIOL 0.1 MG/GM VA CREA: 1.0000 g | TOPICAL_CREAM | VAGINAL | 2 refills | Status: DC

## 2023-02-19 NOTE — Progress Notes (Signed)
   Acute Office Visit  Subjective:    Patient ID: Maria Montgomery, female    DOB: 09-10-1946, 77 y.o.   MRN: [id number]   HPI 77 y.o. presents today for medication management. Using vaginal estrogen for atrophic vaginitis. Does not use consistently. Will have itching at times but cream helps. PCP recommended she start on Prolia after most recent DXA in October. She would like second opinion as medication is expensive. Received Prolia in January. 08/2022 T-score -1.8 (osteopenic in left hip and spine), hip fracture risk 3.8%. Previous DXA 2021 T-score -1.5, hip fracture risk 2.4%. Takes Vit D and exercises regularly.  Rash on bilateral anterior upper arms. Rash comes and goes, red, slightly itchy. Has steroid cream at home.   Patient's last menstrual period was 07/28/1987.    Review of Systems  Constitutional: Negative.   Genitourinary: Negative.   Skin:  Positive for rash.       Objective:    Physical Exam Constitutional:      Appearance: Normal appearance.  Skin:         BP 102/62   Pulse 71   Ht 5' (1.524 m)   Wt 138 lb (62.6 kg)   LMP 07/28/1987   SpO2 98%   BMI 26.95 kg/m  Wt Readings from Last 3 Encounters:  02/19/23 138 lb (62.6 kg)  06/11/22 133 lb 4 oz (60.4 kg)  01/02/22 135 lb (61.2 kg)         Assessment & Plan:   Problem List Items Addressed This Visit   None Visit Diagnoses     Atrophic vaginitis    -  Primary   Relevant Medications   estradiol (ESTRACE) 0.1 MG/GM vaginal cream   Osteopenia of multiple sites       Fracture Risk Assessment Score (FRAX) indicating greater than 3% risk for hip fracture       Dermatitis          Plan: Recommend consistent use of vaginal estrogen twice weekly for better management. Reviewed DXA report and increased risk for hip fracture. Agree with Prolia for management but ultimately up to her. She wants to continue. Continue Vit D supplement and regular exercise. Rash on arms appears to be atopic dermatitis. Use  steroid cream BID x 7-10 days.      Olivia Mackie DNP, 9:37 AM 02/19/2023

## 2023-02-25 DIAGNOSIS — J301 Allergic rhinitis due to pollen: Secondary | ICD-10-CM | POA: Diagnosis not present

## 2023-02-25 DIAGNOSIS — J3089 Other allergic rhinitis: Secondary | ICD-10-CM | POA: Diagnosis not present

## 2023-02-25 DIAGNOSIS — J3081 Allergic rhinitis due to animal (cat) (dog) hair and dander: Secondary | ICD-10-CM | POA: Diagnosis not present

## 2023-03-16 DIAGNOSIS — J3089 Other allergic rhinitis: Secondary | ICD-10-CM | POA: Diagnosis not present

## 2023-03-16 DIAGNOSIS — J3081 Allergic rhinitis due to animal (cat) (dog) hair and dander: Secondary | ICD-10-CM | POA: Diagnosis not present

## 2023-03-16 DIAGNOSIS — J301 Allergic rhinitis due to pollen: Secondary | ICD-10-CM | POA: Diagnosis not present

## 2023-03-18 DIAGNOSIS — J3089 Other allergic rhinitis: Secondary | ICD-10-CM | POA: Diagnosis not present

## 2023-03-18 DIAGNOSIS — J301 Allergic rhinitis due to pollen: Secondary | ICD-10-CM | POA: Diagnosis not present

## 2023-04-06 DIAGNOSIS — J301 Allergic rhinitis due to pollen: Secondary | ICD-10-CM | POA: Diagnosis not present

## 2023-04-06 DIAGNOSIS — J3089 Other allergic rhinitis: Secondary | ICD-10-CM | POA: Diagnosis not present

## 2023-04-06 DIAGNOSIS — J3081 Allergic rhinitis due to animal (cat) (dog) hair and dander: Secondary | ICD-10-CM | POA: Diagnosis not present

## 2023-04-13 DIAGNOSIS — J3081 Allergic rhinitis due to animal (cat) (dog) hair and dander: Secondary | ICD-10-CM | POA: Diagnosis not present

## 2023-04-13 DIAGNOSIS — J301 Allergic rhinitis due to pollen: Secondary | ICD-10-CM | POA: Diagnosis not present

## 2023-04-13 DIAGNOSIS — J3089 Other allergic rhinitis: Secondary | ICD-10-CM | POA: Diagnosis not present

## 2023-04-14 ENCOUNTER — Other Ambulatory Visit: Payer: Self-pay | Admitting: Internal Medicine

## 2023-04-14 DIAGNOSIS — F5101 Primary insomnia: Secondary | ICD-10-CM

## 2023-04-23 ENCOUNTER — Telehealth: Payer: Self-pay

## 2023-04-23 ENCOUNTER — Other Ambulatory Visit (HOSPITAL_COMMUNITY): Payer: Self-pay

## 2023-04-23 DIAGNOSIS — J3089 Other allergic rhinitis: Secondary | ICD-10-CM | POA: Diagnosis not present

## 2023-04-23 DIAGNOSIS — J301 Allergic rhinitis due to pollen: Secondary | ICD-10-CM | POA: Diagnosis not present

## 2023-04-23 DIAGNOSIS — J3081 Allergic rhinitis due to animal (cat) (dog) hair and dander: Secondary | ICD-10-CM | POA: Diagnosis not present

## 2023-04-23 NOTE — Telephone Encounter (Signed)
Prolia VOB initiated via [email or web address]   Last Prolia inj: 11/19/22 Next Prolia inj DUE:  05/20/23  Per test claim, Prolia filled at [facility] is $200 copay for 180 days.

## 2023-04-28 ENCOUNTER — Other Ambulatory Visit (HOSPITAL_COMMUNITY): Payer: Self-pay

## 2023-04-28 NOTE — Telephone Encounter (Signed)
Pt ready for scheduling for Prolia on or after : 05/20/23  Out-of-pocket cost due at time of visit: $322  Primary: [employer] - Medicare Prolia co-insurance: 20% Admin fee co-insurance: $20  Secondary: N/A Prolia co-insurance:  Admin fee co-insurance:   Medical Benefit Details: Date Benefits were checked: 04/27/23 Deductible: no/ Coinsurance: 20%/ Admin Fee: $20  Prior Auth: not required PA# Expiration Date:    Pharmacy benefit: Copay $200 If patient wants fill through the pharmacy benefit please send prescription to:  [facility] , and include estimated need by date in rx notes. Pharmacy will ship medication directly to the office.  Patient not eligible for Prolia Copay Card. Copay Card can make patient's cost as little as $25. Link to apply: [email or web address]://[email or web address]  ** This summary of benefits is an estimation of the patient's out-of-pocket cost. Exact cost may very based on individual plan coverage.

## 2023-04-29 ENCOUNTER — Other Ambulatory Visit: Payer: Self-pay

## 2023-04-29 ENCOUNTER — Other Ambulatory Visit (HOSPITAL_COMMUNITY): Payer: Self-pay

## 2023-04-30 DIAGNOSIS — J3089 Other allergic rhinitis: Secondary | ICD-10-CM | POA: Diagnosis not present

## 2023-04-30 DIAGNOSIS — J301 Allergic rhinitis due to pollen: Secondary | ICD-10-CM | POA: Diagnosis not present

## 2023-04-30 DIAGNOSIS — J3081 Allergic rhinitis due to animal (cat) (dog) hair and dander: Secondary | ICD-10-CM | POA: Diagnosis not present

## 2023-04-30 DIAGNOSIS — Z1231 Encounter for screening mammogram for malignant neoplasm of breast: Secondary | ICD-10-CM | POA: Diagnosis not present

## 2023-04-30 LAB — HM MAMMOGRAPHY

## 2023-05-08 ENCOUNTER — Other Ambulatory Visit (HOSPITAL_COMMUNITY): Payer: Self-pay

## 2023-05-21 DIAGNOSIS — J3089 Other allergic rhinitis: Secondary | ICD-10-CM | POA: Diagnosis not present

## 2023-05-27 ENCOUNTER — Ambulatory Visit: Payer: PPO | Admitting: Sports Medicine

## 2023-05-27 ENCOUNTER — Ambulatory Visit (INDEPENDENT_AMBULATORY_CARE_PROVIDER_SITE_OTHER): Payer: PPO

## 2023-05-27 VITALS — BP 132/82 | HR 87 | Ht 60.0 in | Wt 140.0 lb

## 2023-05-27 DIAGNOSIS — M7631 Iliotibial band syndrome, right leg: Secondary | ICD-10-CM

## 2023-05-27 DIAGNOSIS — M25551 Pain in right hip: Secondary | ICD-10-CM

## 2023-05-27 DIAGNOSIS — M810 Age-related osteoporosis without current pathological fracture: Secondary | ICD-10-CM

## 2023-05-27 MED ORDER — DENOSUMAB 60 MG/ML ~~LOC~~ SOSY
60.0000 mg | PREFILLED_SYRINGE | Freq: Once | SUBCUTANEOUS | Status: AC
Start: 2023-05-27 — End: 2023-05-27
  Administered 2023-05-27: 60 mg via SUBCUTANEOUS

## 2023-05-27 MED ORDER — MELOXICAM 7.5 MG PO TABS: 7.5000 mg | ORAL_TABLET | Freq: Every day | ORAL | 0 refills | Status: DC

## 2023-05-27 NOTE — Progress Notes (Signed)
Maria Montgomery [name] [address] [place] [address] Phone: [phone]   Assessment and Plan:      1. Right hip pain 2. Iliotibial band syndrome of right side  -Acute, uncomplicated, initial sports medicine visit - I suspect muscular strains from patient bracing herself from a fall 10 days ago that have led to gluteal tendon strain, irritation at greater trochanter, IT band syndrome, strain of the lateral gastroc - No red flags, so no imaging at today's visit - Start meloxicam 7.5 mg daily x2 weeks.  If still having pain after 2 weeks, complete 3rd-week of meloxicam. May use remaining meloxicam as needed once daily for pain control.  Do not to use additional NSAIDs while taking meloxicam.  May use Tylenol [phone] mg 2 to 3 times a day for breakthrough pain.  Pertinent previous records reviewed include none   Follow Up: 3 to 4 weeks for reevaluation.  Could consider x-ray imaging versus CSI based on presentation   Subjective:   I, Maria Montgomery, am serving as a Neurosurgeon for Doctor Richardean Sale  Chief Complaint: right leg pain   HPI:   05/27/23 Patient is a 77 year old female complaining of right leg pain. Patient states that her pain started 10 days ago she was at the beach and she had to jerk herself to stop from falling. When she is hyper flexed the pain decreases. She went to water aerobic and that helped. Pain started at her ankle and goes up to the low back . She states her calf pain is the worse. Ibu for the pain and that helps. She also rubs Voltaren cream and that isnt strong enough. She has pain when she first lifts her leg but once she gets the leg up the pain decreases    Relevant Historical Information: Hypothyroidism, osteoporosis  Additional pertinent review of systems negative.   Current Outpatient Medications:    azelastine (OPTIVAR) 0.05 % ophthalmic solution, Place 1 drop into both eyes 2 (two) times daily.,  Disp: 6 mL, Rfl: 5   Bepotastine Besilate (BEPREVE) 1.5 % SOLN, Apply to eye., Disp: , Rfl:    BIOTIN PO, Take 10,000 mg by mouth., Disp: , Rfl:    Cetirizine HCl 10 MG CAPS, Take by mouth as needed. Reported on 11/21/2015, Disp: , Rfl:    chlorhexidine (PERIDEX) 0.12 % solution, , Disp: , Rfl:    Cholecalciferol (VITAMIN D PO), Take 5,000 Int'l Units by mouth., Disp: , Rfl:    denosumab (PROLIA) 60 MG/ML SOSY injection, Inject 60 mg into the skin every 6 (six) months., Disp: , Rfl:    EPINEPHrine 0.3 mg/0.3 mL IJ SOAJ injection, , Disp: , Rfl:    estradiol (ESTRACE) 0.1 MG/GM vaginal cream, Place 1 g vaginally 2 (two) times a week., Disp: 42.5 g, Rfl: 2   fluticasone (FLONASE) 50 MCG/ACT nasal spray, SHAKE LQ AND U 1 TO 2 SPRAYS IEN QD, Disp: , Rfl:    levothyroxine (SYNTHROID) 50 MCG tablet, TAKE 1 TABLET(50 MCG) BY MOUTH DAILY BEFORE BREAKFAST, Disp: 90 tablet, Rfl: 1   meloxicam (MOBIC) 7.5 MG tablet, Take 1 tablet (7.5 mg total) by mouth daily., Disp: 30 tablet, Rfl: 0   mometasone (ELOCON) 0.1 % cream, Apply 1 application topically 2 (two) times daily., Disp: 45 g, Rfl: 0   montelukast (SINGULAIR) 10 MG tablet, Take 10 mg by mouth at bedtime as needed., Disp: , Rfl:    Multiple Vitamins-Minerals (ZINC PO), Take  by mouth., Disp: , Rfl:    rosuvastatin (CRESTOR) 5 MG tablet, TAKE 1 TABLET(5 MG) BY MOUTH DAILY, Disp: 90 tablet, Rfl: 1   UNABLE TO FIND, Allergy shots 2 shots every 2 weeks, Disp: , Rfl:    zolpidem (AMBIEN) 5 MG tablet, TAKE 1 TABLET(5 MG) BY MOUTH AT BEDTIME AS NEEDED FOR SLEEP, Disp: 90 tablet, Rfl: 0   Objective:     Vitals:   05/27/23 1419  BP: 132/82  Pulse: 87  SpO2: 99%  Weight: 140 lb (63.5 kg)  Height: 5' (1.524 m)      Body mass index is 27.34 kg/m.    Physical Exam:    General: awake, alert, and oriented no acute distress, nontoxic Skin: no suspicious lesions or rashes Neuro:sensation intact distally with no deficits, normal muscle tone, no atrophy,  strength 5/5 in all tested lower ext groups Psych: normal mood and affect, speech clear   Right hip: No deformity, swelling or wasting ROM Flexion 90, ext 30, IR 45, ER 45 TTP gluteal musculature, greater trochanter, IT band, lateral gastroc NTTP over the hip flexors,  si joint, lumbar spine Negative log roll with FROM Negative FABER Negative FADIR Negative Piriformis test   Gait antalgic, favoring left leg   Electronically signed by:  Maria Montgomery [name] 2:41 PM 05/27/23

## 2023-05-27 NOTE — Patient Instructions (Signed)
-   Start meloxicam 7.5 mg daily x2 weeks.  If still having pain after 2 weeks, complete 3rd-week of meloxicam. May use remaining meloxicam as needed once daily for pain control.  Do not to use additional NSAIDs while taking meloxicam.  May use Tylenol [phone] mg 2 to 3 times a day for breakthrough pain. Glute HEP  3-4 week follow up

## 2023-05-27 NOTE — Progress Notes (Signed)
Patient here for Prolia injection per Dr. Yetta Barre. Prolia given in left arm subcutaneous  and patient tolerated injection well today.

## 2023-06-03 DIAGNOSIS — J301 Allergic rhinitis due to pollen: Secondary | ICD-10-CM | POA: Diagnosis not present

## 2023-06-03 DIAGNOSIS — J3081 Allergic rhinitis due to animal (cat) (dog) hair and dander: Secondary | ICD-10-CM | POA: Diagnosis not present

## 2023-06-03 DIAGNOSIS — J3089 Other allergic rhinitis: Secondary | ICD-10-CM | POA: Diagnosis not present

## 2023-06-04 ENCOUNTER — Other Ambulatory Visit: Payer: Self-pay

## 2023-06-10 ENCOUNTER — Ambulatory Visit: Payer: PPO | Admitting: Sports Medicine

## 2023-06-10 DIAGNOSIS — J3089 Other allergic rhinitis: Secondary | ICD-10-CM | POA: Diagnosis not present

## 2023-06-10 DIAGNOSIS — J301 Allergic rhinitis due to pollen: Secondary | ICD-10-CM | POA: Diagnosis not present

## 2023-06-10 DIAGNOSIS — J3081 Allergic rhinitis due to animal (cat) (dog) hair and dander: Secondary | ICD-10-CM | POA: Diagnosis not present

## 2023-06-12 NOTE — Progress Notes (Unsigned)
Maria Montgomery [name] [address] [place] [address] Phone: [phone]   Assessment and Plan:     There are no diagnoses linked to this encounter.  ***   Pertinent previous records reviewed include ***   Follow Up: ***     Subjective:   I, Maria Montgomery, am serving as a Neurosurgeon for Doctor Richardean Sale   Chief Complaint: right leg pain    HPI:    05/27/23 Patient is a 77 year old female complaining of right leg pain. Patient states that her pain started 10 days ago she was at the beach and she had to jerk herself to stop from falling. When she is hyper flexed the pain decreases. She went to water aerobic and that helped. Pain started at her ankle and goes up to the low back . She states her calf pain is the worse. Ibu for the pain and that helps. She also rubs Voltaren cream and that isnt strong enough. She has pain when she first lifts her leg but once she gets the leg up the pain decreases    06/15/2023 Patient states   Relevant Historical Information: Hypothyroidism, osteoporosis    Additional pertinent review of systems negative.   Current Outpatient Medications:    azelastine (OPTIVAR) 0.05 % ophthalmic solution, Place 1 drop into both eyes 2 (two) times daily., Disp: 6 mL, Rfl: 5   Bepotastine Besilate (BEPREVE) 1.5 % SOLN, Apply to eye., Disp: , Rfl:    BIOTIN PO, Take 10,000 mg by mouth., Disp: , Rfl:    Cetirizine HCl 10 MG CAPS, Take by mouth as needed. Reported on 11/21/2015, Disp: , Rfl:    chlorhexidine (PERIDEX) 0.12 % solution, , Disp: , Rfl:    Cholecalciferol (VITAMIN D PO), Take 5,000 Int'l Units by mouth., Disp: , Rfl:    denosumab (PROLIA) 60 MG/ML SOSY injection, Inject 60 mg into the skin every 6 (six) months., Disp: , Rfl:    EPINEPHrine 0.3 mg/0.3 mL IJ SOAJ injection, , Disp: , Rfl:    estradiol (ESTRACE) 0.1 MG/GM vaginal cream, Place 1 g vaginally 2 (two) times a week., Disp: 42.5 g, Rfl: 2    fluticasone (FLONASE) 50 MCG/ACT nasal spray, SHAKE LQ AND U 1 TO 2 SPRAYS IEN QD, Disp: , Rfl:    levothyroxine (SYNTHROID) 50 MCG tablet, TAKE 1 TABLET(50 MCG) BY MOUTH DAILY BEFORE BREAKFAST, Disp: 90 tablet, Rfl: 1   meloxicam (MOBIC) 7.5 MG tablet, Take 1 tablet (7.5 mg total) by mouth daily., Disp: 30 tablet, Rfl: 0   mometasone (ELOCON) 0.1 % cream, Apply 1 application topically 2 (two) times daily., Disp: 45 g, Rfl: 0   montelukast (SINGULAIR) 10 MG tablet, Take 10 mg by mouth at bedtime as needed., Disp: , Rfl:    Multiple Vitamins-Minerals (ZINC PO), Take by mouth., Disp: , Rfl:    rosuvastatin (CRESTOR) 5 MG tablet, TAKE 1 TABLET(5 MG) BY MOUTH DAILY, Disp: 90 tablet, Rfl: 1   UNABLE TO FIND, Allergy shots 2 shots every 2 weeks, Disp: , Rfl:    zolpidem (AMBIEN) 5 MG tablet, TAKE 1 TABLET(5 MG) BY MOUTH AT BEDTIME AS NEEDED FOR SLEEP, Disp: 90 tablet, Rfl: 0   Objective:     There were no vitals filed for this visit.    There is no height or weight on file to calculate BMI.    Physical Exam:    ***   Electronically signed by:  Maria Montgomery  [name] 7:09 AM 06/12/23

## 2023-06-15 ENCOUNTER — Ambulatory Visit: Payer: PPO | Admitting: Sports Medicine

## 2023-06-15 ENCOUNTER — Encounter: Payer: Self-pay | Admitting: Internal Medicine

## 2023-06-15 ENCOUNTER — Other Ambulatory Visit: Payer: Self-pay | Admitting: Internal Medicine

## 2023-06-15 ENCOUNTER — Ambulatory Visit (INDEPENDENT_AMBULATORY_CARE_PROVIDER_SITE_OTHER): Payer: PPO | Admitting: Internal Medicine

## 2023-06-15 VITALS — BP 126/78 | HR 77 | Ht 60.0 in | Wt 140.0 lb

## 2023-06-15 VITALS — BP 126/82 | HR 69 | Temp 98.1°F | Resp 16 | Ht 60.0 in | Wt 140.0 lb

## 2023-06-15 DIAGNOSIS — F5101 Primary insomnia: Secondary | ICD-10-CM | POA: Diagnosis not present

## 2023-06-15 DIAGNOSIS — M810 Age-related osteoporosis without current pathological fracture: Secondary | ICD-10-CM | POA: Diagnosis not present

## 2023-06-15 DIAGNOSIS — J3089 Other allergic rhinitis: Secondary | ICD-10-CM | POA: Diagnosis not present

## 2023-06-15 DIAGNOSIS — E785 Hyperlipidemia, unspecified: Secondary | ICD-10-CM | POA: Diagnosis not present

## 2023-06-15 DIAGNOSIS — Z0000 Encounter for general adult medical examination without abnormal findings: Secondary | ICD-10-CM | POA: Diagnosis not present

## 2023-06-15 DIAGNOSIS — E559 Vitamin D deficiency, unspecified: Secondary | ICD-10-CM

## 2023-06-15 DIAGNOSIS — E039 Hypothyroidism, unspecified: Secondary | ICD-10-CM

## 2023-06-15 DIAGNOSIS — M7631 Iliotibial band syndrome, right leg: Secondary | ICD-10-CM | POA: Diagnosis not present

## 2023-06-15 DIAGNOSIS — J301 Allergic rhinitis due to pollen: Secondary | ICD-10-CM | POA: Diagnosis not present

## 2023-06-15 DIAGNOSIS — M25551 Pain in right hip: Secondary | ICD-10-CM

## 2023-06-15 DIAGNOSIS — J3081 Allergic rhinitis due to animal (cat) (dog) hair and dander: Secondary | ICD-10-CM | POA: Diagnosis not present

## 2023-06-15 DIAGNOSIS — Z0001 Encounter for general adult medical examination with abnormal findings: Secondary | ICD-10-CM

## 2023-06-15 LAB — CBC WITH DIFFERENTIAL/PLATELET
Basophils Absolute: 0.1 10*3/uL (ref 0.0–0.1)
Basophils Relative: 0.9 % (ref 0.0–3.0)
Eosinophils Absolute: 0.1 10*3/uL (ref 0.0–0.7)
Eosinophils Relative: 1.1 % (ref 0.0–5.0)
HCT: 39.5 % (ref 36.0–46.0)
Hemoglobin: 13 g/dL (ref 12.0–15.0)
Lymphocytes Relative: 26.1 % (ref 12.0–46.0)
Lymphs Abs: 1.8 10*3/uL (ref 0.7–4.0)
MCHC: 32.8 g/dL (ref 30.0–36.0)
MCV: 94 fl (ref 78.0–100.0)
Monocytes Absolute: 0.7 10*3/uL (ref 0.1–1.0)
Monocytes Relative: 10.8 % (ref 3.0–12.0)
Neutro Abs: 4.2 10*3/uL (ref 1.4–7.7)
Neutrophils Relative %: 61.1 % (ref 43.0–77.0)
Platelets: 243 10*3/uL (ref 150.0–400.0)
RBC: 4.21 Mil/uL (ref 3.87–5.11)
RDW: 12.9 % (ref 11.5–15.5)
WBC: 6.8 10*3/uL (ref 4.0–10.5)

## 2023-06-15 LAB — HEPATIC FUNCTION PANEL
ALT: 15 U/L (ref 0–35)
AST: 20 U/L (ref 0–37)
Albumin: 4.3 g/dL (ref 3.5–5.2)
Alkaline Phosphatase: 41 U/L (ref 39–117)
Bilirubin, Direct: 0.1 mg/dL (ref 0.0–0.3)
Total Bilirubin: 0.6 mg/dL (ref 0.2–1.2)
Total Protein: 6.8 g/dL (ref 6.0–8.3)

## 2023-06-15 LAB — BASIC METABOLIC PANEL
BUN: 17 mg/dL (ref 6–23)
CO2: 25 mEq/L (ref 19–32)
Calcium: 8.8 mg/dL (ref 8.4–10.5)
Chloride: 106 mEq/L (ref 96–112)
Creatinine, Ser: 0.83 mg/dL (ref 0.40–1.20)
GFR: 67.05 mL/min (ref >60.00)
Glucose, Bld: 85 mg/dL (ref 70–99)
Potassium: 4.1 mEq/L (ref 3.5–5.1)
Sodium: 139 mEq/L (ref 135–145)

## 2023-06-15 LAB — PHOSPHORUS: Phosphorus: 3 mg/dL (ref 2.3–4.6)

## 2023-06-15 LAB — LIPID PANEL
Cholesterol: 152 mg/dL (ref 0–200)
HDL: 78.1 mg/dL (ref >39.00)
LDL Cholesterol: 61 mg/dL (ref 0–99)
NonHDL: 73.65
Total CHOL/HDL Ratio: 2
Triglycerides: 65 mg/dL (ref 0.0–149.0)
VLDL: 13 mg/dL (ref 0.0–40.0)

## 2023-06-15 LAB — VITAMIN D 25 HYDROXY (VIT D DEFICIENCY, FRACTURES): VITD: 35.21 ng/mL (ref 30.00–100.00)

## 2023-06-15 LAB — CK: Total CK: 151 U/L (ref 7–177)

## 2023-06-15 LAB — TSH: TSH: 1.67 u[IU]/mL (ref 0.35–5.50)

## 2023-06-15 MED ORDER — ROSUVASTATIN CALCIUM 5 MG PO TABS
5.0000 mg | ORAL_TABLET | Freq: Every day | ORAL | 1 refills | Status: DC
Start: 2023-06-15 — End: 2024-03-07

## 2023-06-15 MED ORDER — ZOLPIDEM TARTRATE 5 MG PO TABS
5.0000 mg | ORAL_TABLET | Freq: Every evening | ORAL | 1 refills | Status: DC | PRN
Start: 2023-06-15 — End: 2023-12-24

## 2023-06-15 NOTE — Patient Instructions (Signed)
Recommend completely 3rd week of meloxicam and then discontinue  Continue HEP  After finishing meloxicam Tylenol [phone] mg 2-3 times a day for pain relief  As needed follow up

## 2023-06-15 NOTE — Patient Instructions (Signed)

## 2023-06-15 NOTE — Progress Notes (Unsigned)
Subjective:  Patient ID: Maria Montgomery, female    DOB: August 14, 1946  Age: 77 y.o. MRN: [id number]  CC: Annual Exam, Hypothyroidism, and Hyperlipidemia   HPI Maria Montgomery presents for a CPX and f/up ----  Discussed the use of AI scribe software for clinical note transcription with the patient, who gave verbal consent to proceed.  History of Present Illness   The patient, with a history of osteoporosis and hyperlipidemia, presents with persistent weakness in the leg and occasional discomfort in the hamstring. They deny any chest pain, shortness of breath, or dizziness during exercise. They express concern about the Prolia injection they received for osteoporosis, suspecting it might be causing their muscle issues. However, they acknowledge the necessity of the medication for maintaining bone density and preventing fractures.  The patient also reports being on a cholesterol medication, taken three times a week, and Meloxicam for pain management. They note that the Meloxicam has been effective, but they intentionally skipped a dose to assess their pain level during the consultation. They participated in water aerobics on the day of the consultation and reported feeling good.  Regarding their thyroid health, the patient denies any symptoms such as palpitations, dizziness, lightheadedness, constipation, or diarrhea. They report regular bowel movements, going two to three times a day. They believe their thyroid level is within the normal range based on their symptomatology. They are due for a thyroid level check, as it has been a year since the last one.       Outpatient Medications Prior to Visit  Medication Sig Dispense Refill   azelastine (OPTIVAR) 0.05 % ophthalmic solution Place 1 drop into both eyes 2 (two) times daily. 6 mL 5   Bepotastine Besilate (BEPREVE) 1.5 % SOLN Apply to eye.     BIOTIN PO Take 10,000 mg by mouth.     Cetirizine HCl 10 MG CAPS Take by mouth as needed. Reported on  11/21/2015     chlorhexidine (PERIDEX) 0.12 % solution      Cholecalciferol (VITAMIN D PO) Take 5,000 Int'l Units by mouth.     denosumab (PROLIA) 60 MG/ML SOSY injection Inject 60 mg into the skin every 6 (six) months.     EPINEPHrine 0.3 mg/0.3 mL IJ SOAJ injection      estradiol (ESTRACE) 0.1 MG/GM vaginal cream Place 1 g vaginally 2 (two) times a week. 42.5 g 2   fluticasone (FLONASE) 50 MCG/ACT nasal spray SHAKE LQ AND U 1 TO 2 SPRAYS IEN QD     levothyroxine (SYNTHROID) 50 MCG tablet TAKE 1 TABLET(50 MCG) BY MOUTH DAILY BEFORE BREAKFAST 90 tablet 1   meloxicam (MOBIC) 7.5 MG tablet Take 1 tablet (7.5 mg total) by mouth daily. 30 tablet 0   mometasone (ELOCON) 0.1 % cream Apply 1 application topically 2 (two) times daily. 45 g 0   montelukast (SINGULAIR) 10 MG tablet Take 10 mg by mouth at bedtime as needed.     Multiple Vitamins-Minerals (ZINC PO) Take by mouth.     UNABLE TO FIND Allergy shots 2 shots every 2 weeks     rosuvastatin (CRESTOR) 5 MG tablet TAKE 1 TABLET(5 MG) BY MOUTH DAILY 90 tablet 1   zolpidem (AMBIEN) 5 MG tablet TAKE 1 TABLET(5 MG) BY MOUTH AT BEDTIME AS NEEDED FOR SLEEP 90 tablet 0   No facility-administered medications prior to visit.    ROS Review of Systems  Constitutional: Negative.  Negative for fatigue.  HENT: Negative.    Eyes: Negative.  Respiratory: Negative.  Negative for cough, chest tightness, shortness of breath and wheezing.   Cardiovascular:  Negative for chest pain, palpitations and leg swelling.  Gastrointestinal:  Negative for abdominal pain, constipation, diarrhea, nausea and vomiting.  Endocrine: Negative for heat intolerance.  Genitourinary: Negative.  Negative for difficulty urinating.  Musculoskeletal:  Positive for back pain. Negative for arthralgias and myalgias.  Skin: Negative.   Neurological: Negative.  Negative for dizziness.  Hematological:  Negative for adenopathy. Does not bruise/bleed easily.  Psychiatric/Behavioral:   Positive for sleep disturbance. Negative for behavioral problems, decreased concentration and dysphoric mood.     Objective:  BP 126/82 (BP Location: Right Arm, Patient Position: Sitting, Cuff Size: Large)   Pulse 69   Temp 98.1 F (36.7 C) (Oral)   Resp 16   Ht 5' (1.524 m)   Wt 140 lb (63.5 kg)   LMP 07/28/1987   SpO2 98%   BMI 27.34 kg/m   BP Readings from Last 3 Encounters:  06/15/23 126/78  06/15/23 126/82  05/27/23 132/82    Wt Readings from Last 3 Encounters:  06/15/23 140 lb (63.5 kg)  06/15/23 140 lb (63.5 kg)  05/27/23 140 lb (63.5 kg)    Physical Exam Vitals reviewed.  Constitutional:      Appearance: Normal appearance.  HENT:     Nose: Nose normal.     Mouth/Throat:     Mouth: Mucous membranes are moist.  Eyes:     General: No scleral icterus.    Conjunctiva/sclera: Conjunctivae normal.  Cardiovascular:     Rate and Rhythm: Normal rate and regular rhythm.     Heart sounds: No murmur heard.    No gallop.  Pulmonary:     Effort: Pulmonary effort is normal.     Breath sounds: No stridor. No wheezing, rhonchi or rales.  Abdominal:     General: Abdomen is flat.     Palpations: There is no mass.     Tenderness: There is no abdominal tenderness. There is no guarding.     Hernia: No hernia is present.  Musculoskeletal:        General: Normal range of motion.     Cervical back: Neck supple.     Right lower leg: No edema.     Left lower leg: No edema.  Lymphadenopathy:     Cervical: No cervical adenopathy.  Skin:    General: Skin is warm and dry.  Neurological:     General: No focal deficit present.     Mental Status: She is alert. Mental status is at baseline.  Psychiatric:        Mood and Affect: Mood normal.        Behavior: Behavior normal.     Lab Results  Component Value Date   WBC 6.8 06/15/2023   HGB 13.0 06/15/2023   HCT 39.5 06/15/2023   PLT 243.0 06/15/2023   GLUCOSE 85 06/15/2023   CHOL 152 06/15/2023   TRIG 65.0 06/15/2023    HDL 78.10 06/15/2023   LDLDIRECT 97.6 02/05/2009   LDLCALC 61 06/15/2023   ALT 15 06/15/2023   AST 20 06/15/2023   NA 139 06/15/2023   K 4.1 06/15/2023   CL 106 06/15/2023   CREATININE 0.83 06/15/2023   BUN 17 06/15/2023   CO2 25 06/15/2023   TSH 1.67 06/15/2023    DG Finger Ring Left  Result Date: 04/22/2018 CLINICAL DATA:  Injured the fourth finger 3 weeks ago with pain and swelling of the PIP joint  EXAM: LEFT RING FINGER 2+V COMPARISON:  Left hand films of 05/14/2015 FINDINGS: There may be a tiny nondisplaced avulsion from the distal aspect of the proximal phalanx of the left fourth digit with adjacent soft tissue swelling. No other acute abnormality is seen. Mild degenerative changes present involving the DIP and PIP joints IMPRESSION: 1. Questionable small avulsion fragment from the distal aspect of the proximal phalanx of the left fourth digit with soft tissue swelling. 2. Degenerative change of the DIP joints. Electronically Signed   By: Dwyane Dee M.D.   On: 04/22/2018 08:46    Assessment & Plan:   Age-related osteoporosis without current pathological fracture -     Phosphorus; Future -     Basic metabolic panel; Future -     Hepatic function panel; Future -     VITAMIN D 25 Hydroxy (Vit-D Deficiency, Fractures); Future  Encounter for general adult medical examination with abnormal findings- Exam completed, labs reviewed, vaccines reviewed and updated, cancer screenings addressed, pt ed material was given.   Hyperlipidemia LDL goal <100 - LDL goal achieved. Doing well on the statin  -     Lipid panel; Future -     Hepatic function panel; Future -     Lipoprotein A (LPA); Future -     CK; Future -     Rosuvastatin Calcium; Take 1 tablet (5 mg total) by mouth daily.  Dispense: 90 tablet; Refill: 1  Acquired hypothyroidism- She is euthyroid. -     CBC with Differential/Platelet; Future -     Hepatic function panel; Future -     TSH; Future  Vitamin D deficiency -      VITAMIN D 25 Hydroxy (Vit-D Deficiency, Fractures); Future  Primary insomnia -     Zolpidem Tartrate; Take 1 tablet (5 mg total) by mouth at bedtime as needed for sleep.  Dispense: 90 tablet; Refill: 1     Follow-up: Return in about 6 months (around 12/16/2023).  Sanda Linger, MD

## 2023-06-16 ENCOUNTER — Ambulatory Visit: Payer: PPO | Admitting: Sports Medicine

## 2023-06-22 DIAGNOSIS — J3081 Allergic rhinitis due to animal (cat) (dog) hair and dander: Secondary | ICD-10-CM | POA: Diagnosis not present

## 2023-06-22 DIAGNOSIS — J301 Allergic rhinitis due to pollen: Secondary | ICD-10-CM | POA: Diagnosis not present

## 2023-06-22 DIAGNOSIS — J3089 Other allergic rhinitis: Secondary | ICD-10-CM | POA: Diagnosis not present

## 2023-06-30 NOTE — Telephone Encounter (Signed)
Pts pharmacy has delivered her Prolia.  Pt is to only pay Pharmacy benefit: Copay $200.

## 2023-07-09 DIAGNOSIS — J3089 Other allergic rhinitis: Secondary | ICD-10-CM | POA: Diagnosis not present

## 2023-07-09 DIAGNOSIS — J301 Allergic rhinitis due to pollen: Secondary | ICD-10-CM | POA: Diagnosis not present

## 2023-07-15 DIAGNOSIS — H524 Presbyopia: Secondary | ICD-10-CM | POA: Diagnosis not present

## 2023-07-15 DIAGNOSIS — Z961 Presence of intraocular lens: Secondary | ICD-10-CM | POA: Diagnosis not present

## 2023-07-15 DIAGNOSIS — H353132 Nonexudative age-related macular degeneration, bilateral, intermediate dry stage: Secondary | ICD-10-CM | POA: Diagnosis not present

## 2023-07-15 DIAGNOSIS — H04123 Dry eye syndrome of bilateral lacrimal glands: Secondary | ICD-10-CM | POA: Diagnosis not present

## 2023-07-15 DIAGNOSIS — J301 Allergic rhinitis due to pollen: Secondary | ICD-10-CM | POA: Diagnosis not present

## 2023-07-15 DIAGNOSIS — J3089 Other allergic rhinitis: Secondary | ICD-10-CM | POA: Diagnosis not present

## 2023-07-22 ENCOUNTER — Telehealth: Payer: Self-pay | Admitting: Internal Medicine

## 2023-07-22 NOTE — Telephone Encounter (Signed)
I have reviewed pt's vaccine history with her and provided the dates she had Prevnar14, Pneumovax and Shingrix.

## 2023-07-22 NOTE — Telephone Encounter (Signed)
Pt is wondering do she need or did she ALREADY have the shingles and pneumonia shot.. She's don't know what date in particular that she had it done. She want to make sure she's not due for one. She's unsure and feels as if she had it done with [place] or someone else.. 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 Can someone help this pt and I, just to make sure she's led the correct way with getting her vaccines.

## 2023-07-23 DIAGNOSIS — J3089 Other allergic rhinitis: Secondary | ICD-10-CM | POA: Diagnosis not present

## 2023-07-23 DIAGNOSIS — J3081 Allergic rhinitis due to animal (cat) (dog) hair and dander: Secondary | ICD-10-CM | POA: Diagnosis not present

## 2023-07-23 DIAGNOSIS — J301 Allergic rhinitis due to pollen: Secondary | ICD-10-CM | POA: Diagnosis not present

## 2023-07-25 ENCOUNTER — Encounter (HOSPITAL_COMMUNITY): Payer: Self-pay

## 2023-07-29 DIAGNOSIS — J3089 Other allergic rhinitis: Secondary | ICD-10-CM | POA: Diagnosis not present

## 2023-07-29 DIAGNOSIS — J301 Allergic rhinitis due to pollen: Secondary | ICD-10-CM | POA: Diagnosis not present

## 2023-07-29 DIAGNOSIS — J3081 Allergic rhinitis due to animal (cat) (dog) hair and dander: Secondary | ICD-10-CM | POA: Diagnosis not present

## 2023-08-11 ENCOUNTER — Encounter: Payer: Self-pay | Admitting: Internal Medicine

## 2023-08-11 ENCOUNTER — Ambulatory Visit (INDEPENDENT_AMBULATORY_CARE_PROVIDER_SITE_OTHER): Payer: PPO | Admitting: Internal Medicine

## 2023-08-11 VITALS — BP 122/84 | HR 97 | Temp 97.7°F | Ht 60.0 in | Wt 139.0 lb

## 2023-08-11 DIAGNOSIS — Z23 Encounter for immunization: Secondary | ICD-10-CM | POA: Diagnosis not present

## 2023-08-11 DIAGNOSIS — N3001 Acute cystitis with hematuria: Secondary | ICD-10-CM

## 2023-08-11 DIAGNOSIS — R300 Dysuria: Secondary | ICD-10-CM | POA: Diagnosis not present

## 2023-08-11 LAB — POCT URINALYSIS DIPSTICK
Blood, UA: POSITIVE
Glucose, UA: NEGATIVE
Ketones, UA: NEGATIVE
Protein, UA: POSITIVE — AB
Spec Grav, UA: >=1.03 — AB (ref 1.010–1.025)
Urobilinogen, UA: NEGATIVE U/dL — AB
pH, UA: 6 (ref 5.0–8.0)

## 2023-08-11 MED ORDER — NITROFURANTOIN MONOHYD MACRO 100 MG PO CAPS: 100.0000 mg | ORAL_CAPSULE | Freq: Two times a day (BID) | ORAL | 0 refills | Status: AC

## 2023-08-11 NOTE — Assessment & Plan Note (Signed)
Rx macrobid. POC U/A done and consistent with infection. No signs of pyelonephritis.

## 2023-08-11 NOTE — Patient Instructions (Signed)
We have sent in macrobid to take 1 pill twice a day for 5 days. 

## 2023-08-11 NOTE — Progress Notes (Signed)
   Subjective:   Patient ID: Maria Montgomery, female    DOB: 30-Mar-1946, 77 y.o.   MRN: [id number]  HPI The patient is a 77 YO female coming in for possible UTI.  Review of Systems  Constitutional: Negative.   Respiratory: Negative.    Cardiovascular: Negative.   Gastrointestinal:  Positive for abdominal pain. Negative for abdominal distention, constipation, diarrhea, nausea and vomiting.  Genitourinary:  Positive for dysuria, frequency and urgency.  Musculoskeletal: Negative.   Skin: Negative.     Objective:  Physical Exam Constitutional:      Appearance: Normal appearance. She is well-developed.  HENT:     Head: Normocephalic and atraumatic.  Eyes:     Extraocular Movements: Extraocular movements intact.  Cardiovascular:     Rate and Rhythm: Normal rate and regular rhythm.  Pulmonary:     Effort: Pulmonary effort is normal. No respiratory distress.     Breath sounds: Normal breath sounds. No wheezing or rales.  Abdominal:     General: Bowel sounds are normal. There is no distension.     Palpations: Abdomen is soft.     Tenderness: There is abdominal tenderness. There is no rebound.  Musculoskeletal:     Cervical back: Normal range of motion.  Skin:    General: Skin is warm and dry.  Neurological:     Mental Status: She is alert and oriented to person, place, and time.     Coordination: Coordination normal.     Vitals:   08/11/23 0930  BP: 122/84  Pulse: 97  Temp: 97.7 F (36.5 C)  TempSrc: Oral  SpO2: 98%  Weight: 139 lb (63 kg)  Height: 5' (1.524 m)    Assessment & Plan:  Flu shot given at visit

## 2023-08-12 DIAGNOSIS — J301 Allergic rhinitis due to pollen: Secondary | ICD-10-CM | POA: Diagnosis not present

## 2023-08-12 DIAGNOSIS — J3081 Allergic rhinitis due to animal (cat) (dog) hair and dander: Secondary | ICD-10-CM | POA: Diagnosis not present

## 2023-08-12 DIAGNOSIS — J3089 Other allergic rhinitis: Secondary | ICD-10-CM | POA: Diagnosis not present

## 2023-08-13 ENCOUNTER — Other Ambulatory Visit: Payer: Self-pay | Admitting: Internal Medicine

## 2023-08-13 DIAGNOSIS — E890 Postprocedural hypothyroidism: Secondary | ICD-10-CM

## 2023-08-13 DIAGNOSIS — E039 Hypothyroidism, unspecified: Secondary | ICD-10-CM

## 2023-08-26 DIAGNOSIS — J3089 Other allergic rhinitis: Secondary | ICD-10-CM | POA: Diagnosis not present

## 2023-08-26 DIAGNOSIS — J3081 Allergic rhinitis due to animal (cat) (dog) hair and dander: Secondary | ICD-10-CM | POA: Diagnosis not present

## 2023-08-26 DIAGNOSIS — J301 Allergic rhinitis due to pollen: Secondary | ICD-10-CM | POA: Diagnosis not present

## 2023-08-31 ENCOUNTER — Telehealth: Payer: Self-pay

## 2023-08-31 DIAGNOSIS — M810 Age-related osteoporosis without current pathological fracture: Secondary | ICD-10-CM

## 2023-08-31 MED ORDER — DENOSUMAB 60 MG/ML ~~LOC~~ SOSY
60.0000 mg | PREFILLED_SYRINGE | Freq: Once | SUBCUTANEOUS | Status: DC
Start: 2023-11-04 — End: 2023-12-29

## 2023-08-31 NOTE — Telephone Encounter (Signed)
Patient last prolia injection July 2024, next due January 2025.  Orders in now to address PA for after 11/04/2023 and scheduling.

## 2023-09-09 DIAGNOSIS — J45991 Cough variant asthma: Secondary | ICD-10-CM | POA: Diagnosis not present

## 2023-09-09 DIAGNOSIS — J301 Allergic rhinitis due to pollen: Secondary | ICD-10-CM | POA: Diagnosis not present

## 2023-09-09 DIAGNOSIS — J3089 Other allergic rhinitis: Secondary | ICD-10-CM | POA: Diagnosis not present

## 2023-09-09 DIAGNOSIS — H1045 Other chronic allergic conjunctivitis: Secondary | ICD-10-CM | POA: Diagnosis not present

## 2023-09-15 DIAGNOSIS — J301 Allergic rhinitis due to pollen: Secondary | ICD-10-CM | POA: Diagnosis not present

## 2023-09-15 DIAGNOSIS — J3089 Other allergic rhinitis: Secondary | ICD-10-CM | POA: Diagnosis not present

## 2023-09-15 DIAGNOSIS — J3081 Allergic rhinitis due to animal (cat) (dog) hair and dander: Secondary | ICD-10-CM | POA: Diagnosis not present

## 2023-09-24 DIAGNOSIS — J301 Allergic rhinitis due to pollen: Secondary | ICD-10-CM | POA: Diagnosis not present

## 2023-09-24 DIAGNOSIS — J3089 Other allergic rhinitis: Secondary | ICD-10-CM | POA: Diagnosis not present

## 2023-09-28 DIAGNOSIS — J301 Allergic rhinitis due to pollen: Secondary | ICD-10-CM | POA: Diagnosis not present

## 2023-09-28 DIAGNOSIS — J3081 Allergic rhinitis due to animal (cat) (dog) hair and dander: Secondary | ICD-10-CM | POA: Diagnosis not present

## 2023-09-28 DIAGNOSIS — J3089 Other allergic rhinitis: Secondary | ICD-10-CM | POA: Diagnosis not present

## 2023-10-12 DIAGNOSIS — J301 Allergic rhinitis due to pollen: Secondary | ICD-10-CM | POA: Diagnosis not present

## 2023-10-12 DIAGNOSIS — J3089 Other allergic rhinitis: Secondary | ICD-10-CM | POA: Diagnosis not present

## 2023-10-12 DIAGNOSIS — J3081 Allergic rhinitis due to animal (cat) (dog) hair and dander: Secondary | ICD-10-CM | POA: Diagnosis not present

## 2023-10-15 NOTE — Progress Notes (Unsigned)
Aleen [facility]ells [name] [address] [place] [address] Phone: [phone]   Assessment and Plan:     There are no diagnoses linked to this encounter.  ***   Pertinent previous records reviewed include ***    Follow Up: ***     Subjective:   I, Chauncy Mangiaracina, am serving as a Neurosurgeon for Doctor Richardean Sale   Chief Complaint: right leg pain    HPI:    05/27/23 Patient is a 77 year old female complaining of right leg pain. Patient states that her pain started 10 days ago she was at the beach and she had to jerk herself to stop from falling. When she is hyper flexed the pain decreases. She went to water aerobic and that helped. Pain started at her ankle and goes up to the low back . She states her calf pain is the worse. Ibu for the pain and that helps. She also rubs Voltaren cream and that isnt strong enough. She has pain when she first lifts her leg but once she gets the leg up the pain decreases    06/15/2023  Patient states that she is better   10/16/2023 Patient states   Relevant Historical Information: Hypothyroidism, osteoporosis Additional pertinent review of systems negative.   Current Outpatient Medications:    azelastine (OPTIVAR) 0.05 % ophthalmic solution, Place 1 drop into both eyes 2 (two) times daily., Disp: 6 mL, Rfl: 5   Bepotastine Besilate (BEPREVE) 1.5 % SOLN, Apply to eye., Disp: , Rfl:    BIOTIN PO, Take 10,000 mg by mouth., Disp: , Rfl:    Cetirizine HCl 10 MG CAPS, Take by mouth as needed. Reported on 11/21/2015, Disp: , Rfl:    chlorhexidine (PERIDEX) 0.12 % solution, , Disp: , Rfl:    Cholecalciferol (VITAMIN D PO), Take 5,000 Int'l Units by mouth., Disp: , Rfl:    denosumab (PROLIA) 60 MG/ML SOSY injection, Inject 60 mg into the skin every 6 (six) months., Disp: , Rfl:    EPINEPHrine 0.3 mg/0.3 mL IJ SOAJ injection, , Disp: , Rfl:    estradiol (ESTRACE) 0.1 MG/GM vaginal cream, Place 1 g vaginally  2 (two) times a week., Disp: 42.5 g, Rfl: 2   fluticasone (FLONASE) 50 MCG/ACT nasal spray, SHAKE LQ AND U 1 TO 2 SPRAYS IEN QD, Disp: , Rfl:    levothyroxine (SYNTHROID) 50 MCG tablet, TAKE 1 TABLET(50 MCG) BY MOUTH DAILY BEFORE BREAKFAST, Disp: 90 tablet, Rfl: 1   meloxicam (MOBIC) 7.5 MG tablet, Take 1 tablet (7.5 mg total) by mouth daily., Disp: 30 tablet, Rfl: 0   mometasone (ELOCON) 0.1 % cream, Apply 1 application topically 2 (two) times daily., Disp: 45 g, Rfl: 0   montelukast (SINGULAIR) 10 MG tablet, Take 10 mg by mouth at bedtime as needed., Disp: , Rfl:    Multiple Vitamins-Minerals (ZINC PO), Take by mouth., Disp: , Rfl:    rosuvastatin (CRESTOR) 5 MG tablet, Take 1 tablet (5 mg total) by mouth daily., Disp: 90 tablet, Rfl: 1   UNABLE TO FIND, Allergy shots 2 shots every 2 weeks, Disp: , Rfl:    zolpidem (AMBIEN) 5 MG tablet, Take 1 tablet (5 mg total) by mouth at bedtime as needed for sleep., Disp: 90 tablet, Rfl: 1  Current Facility-Administered Medications:    [START ON 11/04/2023] denosumab (PROLIA) injection 60 mg, 60 mg, Subcutaneous, Once, [name], MD   Objective:     There were no vitals  filed for this visit.    There is no height or weight on file to calculate BMI.    Physical Exam:    ***   Electronically signed by:  Aleen [facility]ells [name] 7:35 AM 10/15/23

## 2023-10-16 ENCOUNTER — Ambulatory Visit: Payer: PPO | Admitting: Sports Medicine

## 2023-10-16 NOTE — Progress Notes (Unsigned)
[facility] [address] [place] [address] Phone: [phone]   Assessment and Plan:     There are no diagnoses linked to this encounter.  ***   Pertinent previous records reviewed include ***    Follow Up: ***     Subjective:   I, Maria Montgomery, am serving as a Neurosurgeon for Maria Montgomery   Chief Complaint: right leg pain    HPI:    05/27/23 Patient is a 77 year old female complaining of right leg pain. Patient states that her pain started 10 days ago she was at the beach and she had to jerk herself to stop from falling. When she is hyper flexed the pain decreases. She went to water aerobic and that helped. Pain started at her ankle and goes up to the low back . She states her calf pain is the worse. Ibu for the pain and that helps. She also rubs Voltaren cream and that isnt strong enough. She has pain when she first lifts her leg but once she gets the leg up the pain decreases    06/15/2023  Patient states that she is better   10/19/2023 Patient states   Relevant Historical Information: Hypothyroidism, osteoporosis Additional pertinent review of systems negative.   Current Outpatient Medications:    azelastine (OPTIVAR) 0.05 % ophthalmic solution, Place 1 drop into both eyes 2 (two) times daily., Disp: 6 mL, Rfl: 5   Bepotastine Besilate (BEPREVE) 1.5 % SOLN, Apply to eye., Disp: , Rfl:    BIOTIN PO, Take 10,000 mg by mouth., Disp: , Rfl:    Cetirizine HCl 10 MG CAPS, Take by mouth as needed. Reported on 11/21/2015, Disp: , Rfl:    chlorhexidine (PERIDEX) 0.12 % solution, , Disp: , Rfl:    Cholecalciferol (VITAMIN D PO), Take 5,000 Int'l Units by mouth., Disp: , Rfl:    denosumab (PROLIA) 60 MG/ML SOSY injection, Inject 60 mg into the skin every 6 (six) months., Disp: , Rfl:    EPINEPHrine 0.3 mg/0.3 mL IJ SOAJ injection, , Disp: , Rfl:    estradiol (ESTRACE) 0.1 MG/GM vaginal cream, Place 1 g vaginally  2 (two) times a week., Disp: 42.5 g, Rfl: 2   fluticasone (FLONASE) 50 MCG/ACT nasal spray, SHAKE LQ AND U 1 TO 2 SPRAYS IEN QD, Disp: , Rfl:    levothyroxine (SYNTHROID) 50 MCG tablet, TAKE 1 TABLET(50 MCG) BY MOUTH DAILY BEFORE BREAKFAST, Disp: 90 tablet, Rfl: 1   meloxicam (MOBIC) 7.5 MG tablet, Take 1 tablet (7.5 mg total) by mouth daily., Disp: 30 tablet, Rfl: 0   mometasone (ELOCON) 0.1 % cream, Apply 1 application topically 2 (two) times daily., Disp: 45 g, Rfl: 0   montelukast (SINGULAIR) 10 MG tablet, Take 10 mg by mouth at bedtime as needed., Disp: , Rfl:    Multiple Vitamins-Minerals (ZINC PO), Take by mouth., Disp: , Rfl:    rosuvastatin (CRESTOR) 5 MG tablet, Take 1 tablet (5 mg total) by mouth daily., Disp: 90 tablet, Rfl: 1   UNABLE TO FIND, Allergy shots 2 shots every 2 weeks, Disp: , Rfl:    zolpidem (AMBIEN) 5 MG tablet, Take 1 tablet (5 mg total) by mouth at bedtime as needed for sleep., Disp: 90 tablet, Rfl: 1  Current Facility-Administered Medications:    [START ON 11/04/2023] denosumab (PROLIA) injection 60 mg, 60 mg, Subcutaneous, Once, Maria Grandchild, MD   Objective:     There were no vitals  filed for this visit.    There is no height or weight on file to calculate BMI.    Physical Exam:    ***   Electronically signed by:  [facility] 7:31 AM 10/16/23

## 2023-10-19 ENCOUNTER — Ambulatory Visit (INDEPENDENT_AMBULATORY_CARE_PROVIDER_SITE_OTHER): Payer: PPO

## 2023-10-19 ENCOUNTER — Ambulatory Visit: Payer: PPO | Admitting: Sports Medicine

## 2023-10-19 VITALS — BP 122/82 | HR 78 | Ht 60.0 in | Wt 139.0 lb

## 2023-10-19 DIAGNOSIS — M1611 Unilateral primary osteoarthritis, right hip: Secondary | ICD-10-CM | POA: Diagnosis not present

## 2023-10-19 DIAGNOSIS — M8580 Other specified disorders of bone density and structure, unspecified site: Secondary | ICD-10-CM | POA: Diagnosis not present

## 2023-10-19 DIAGNOSIS — J3081 Allergic rhinitis due to animal (cat) (dog) hair and dander: Secondary | ICD-10-CM | POA: Diagnosis not present

## 2023-10-19 DIAGNOSIS — J301 Allergic rhinitis due to pollen: Secondary | ICD-10-CM | POA: Diagnosis not present

## 2023-10-19 DIAGNOSIS — M25551 Pain in right hip: Secondary | ICD-10-CM

## 2023-10-19 DIAGNOSIS — J3089 Other allergic rhinitis: Secondary | ICD-10-CM | POA: Diagnosis not present

## 2023-10-19 MED ORDER — MELOXICAM 7.5 MG PO TABS: 7.5000 mg | ORAL_TABLET | Freq: Every day | ORAL | 0 refills | Status: DC | PRN

## 2023-10-19 NOTE — Patient Instructions (Signed)
Tylenol [phone] mg 2-3 times a day as needed for pain relief  Meloxicam refill . Use no more than 1 time per week  Right leg HEP Call if you would like PT  As needed follow up

## 2023-10-26 DIAGNOSIS — J3081 Allergic rhinitis due to animal (cat) (dog) hair and dander: Secondary | ICD-10-CM | POA: Diagnosis not present

## 2023-10-26 DIAGNOSIS — J301 Allergic rhinitis due to pollen: Secondary | ICD-10-CM | POA: Diagnosis not present

## 2023-10-26 DIAGNOSIS — J3089 Other allergic rhinitis: Secondary | ICD-10-CM | POA: Diagnosis not present

## 2023-10-27 ENCOUNTER — Other Ambulatory Visit: Payer: Self-pay

## 2023-11-05 ENCOUNTER — Telehealth: Payer: Self-pay

## 2023-11-05 DIAGNOSIS — J301 Allergic rhinitis due to pollen: Secondary | ICD-10-CM | POA: Diagnosis not present

## 2023-11-05 DIAGNOSIS — J3089 Other allergic rhinitis: Secondary | ICD-10-CM | POA: Diagnosis not present

## 2023-11-05 NOTE — Telephone Encounter (Signed)
 Prolia VOB initiated via [email or web address]  Next Prolia inj DUE: 11/27/23

## 2023-11-06 ENCOUNTER — Other Ambulatory Visit (HOSPITAL_COMMUNITY): Payer: Self-pay

## 2023-11-06 NOTE — Telephone Encounter (Signed)
 Pt ready for scheduling for Prolia  on or after : 11/27/23  Out-of-pocket cost due at time of visit: $~323  Number of injection/visits approved: -  Primary: [employer] - Medicare Prolia  co-insurance: 20% Admin fee co-insurance: no  Secondary: N/A Prolia  co-insurance:  Admin fee co-insurance:   Medical Benefit Details: Date Benefits were checked: 11/06/23 Deductible: no/ Coinsurance: 20%/ Admin Fee: no  Prior Auth: not required PA# Expiration Date:   # of doses approved:  Pharmacy benefit: Copay $250 If patient wants fill through the pharmacy benefit please send prescription to:  [facility] , and include estimated need by date in rx notes. Pharmacy will ship medication directly to the office.  Patient not eligible for Prolia  Copay Card. Copay Card can make patient's cost as little as $25. Link to apply: https://www.amgensupportplus.[email or web address]/copay  ** This summary of benefits is an estimation of the patient's out-of-pocket cost. Exact cost may very based on individual plan coverage.

## 2023-11-09 ENCOUNTER — Telehealth: Payer: Self-pay

## 2023-11-09 NOTE — Telephone Encounter (Signed)
 Copied from CRM [id number]. Topic: Appointments - Appointment Info/Confirmation >> Nov 09, 2023 11:15 AM Tiffany H wrote: Patient called to schedule 6 month follow up. Patient is do a repeat of labs. Please submit order for labs and follow up with patient so that she can schedule a lab visit before/after visit per Dr. Joshua' preference. Please assist.

## 2023-11-10 ENCOUNTER — Other Ambulatory Visit: Payer: Self-pay

## 2023-11-10 ENCOUNTER — Other Ambulatory Visit: Payer: Self-pay | Admitting: Internal Medicine

## 2023-11-10 DIAGNOSIS — M810 Age-related osteoporosis without current pathological fracture: Secondary | ICD-10-CM

## 2023-11-10 DIAGNOSIS — E039 Hypothyroidism, unspecified: Secondary | ICD-10-CM

## 2023-11-10 NOTE — Telephone Encounter (Signed)
 Unable to reach patient. Left a very detailed message that her labs has been ordered and she can come in the office and have them drawn at any time prior to her 6 month appointment.

## 2023-11-10 NOTE — Progress Notes (Unsigned)
 Lab Results  Component Value Date   WBC 6.8 06/15/2023   HGB 13.0 06/15/2023   HCT 39.5 06/15/2023   PLT 243.0 06/15/2023   GLUCOSE 85 06/15/2023   CHOL 152 06/15/2023   TRIG 65.0 06/15/2023   HDL 78.10 06/15/2023   LDLDIRECT 97.6 02/05/2009   LDLCALC 61 06/15/2023   ALT 15 06/15/2023   AST 20 06/15/2023   NA 139 06/15/2023   K 4.1 06/15/2023   CL 106 06/15/2023   CREATININE 0.83 06/15/2023   BUN 17 06/15/2023   CO2 25 06/15/2023   TSH 1.67 06/15/2023

## 2023-11-10 NOTE — Telephone Encounter (Signed)
 Can you place her lab orders sop she can have them done prior to her appointment ?

## 2023-11-11 NOTE — Telephone Encounter (Signed)
 Patient has been made aware that her labs has been placed and she will have them done prior to her appt.

## 2023-11-12 DIAGNOSIS — J301 Allergic rhinitis due to pollen: Secondary | ICD-10-CM | POA: Diagnosis not present

## 2023-11-12 DIAGNOSIS — J3089 Other allergic rhinitis: Secondary | ICD-10-CM | POA: Diagnosis not present

## 2023-11-17 DIAGNOSIS — J301 Allergic rhinitis due to pollen: Secondary | ICD-10-CM | POA: Diagnosis not present

## 2023-11-17 DIAGNOSIS — J3089 Other allergic rhinitis: Secondary | ICD-10-CM | POA: Diagnosis not present

## 2023-11-17 DIAGNOSIS — J3081 Allergic rhinitis due to animal (cat) (dog) hair and dander: Secondary | ICD-10-CM | POA: Diagnosis not present

## 2023-11-25 DIAGNOSIS — J301 Allergic rhinitis due to pollen: Secondary | ICD-10-CM | POA: Diagnosis not present

## 2023-11-25 DIAGNOSIS — J3089 Other allergic rhinitis: Secondary | ICD-10-CM | POA: Diagnosis not present

## 2023-11-25 DIAGNOSIS — J3081 Allergic rhinitis due to animal (cat) (dog) hair and dander: Secondary | ICD-10-CM | POA: Diagnosis not present

## 2023-11-30 DIAGNOSIS — J301 Allergic rhinitis due to pollen: Secondary | ICD-10-CM | POA: Diagnosis not present

## 2023-11-30 DIAGNOSIS — J3089 Other allergic rhinitis: Secondary | ICD-10-CM | POA: Diagnosis not present

## 2023-11-30 DIAGNOSIS — J3081 Allergic rhinitis due to animal (cat) (dog) hair and dander: Secondary | ICD-10-CM | POA: Diagnosis not present

## 2023-12-10 ENCOUNTER — Other Ambulatory Visit (HOSPITAL_COMMUNITY): Payer: Self-pay

## 2023-12-18 NOTE — Telephone Encounter (Signed)
Called and spoke with patient to get them scheduled for their prolia. Patient informed me she wanted to check with Dr.Jones before proceeding with getting this medication ordered/administered.

## 2023-12-22 ENCOUNTER — Ambulatory Visit: Payer: PPO | Admitting: Sports Medicine

## 2023-12-22 VITALS — HR 75 | Ht 60.0 in | Wt 132.0 lb

## 2023-12-22 DIAGNOSIS — G8929 Other chronic pain: Secondary | ICD-10-CM | POA: Diagnosis not present

## 2023-12-22 DIAGNOSIS — M7631 Iliotibial band syndrome, right leg: Secondary | ICD-10-CM

## 2023-12-22 DIAGNOSIS — M25551 Pain in right hip: Secondary | ICD-10-CM

## 2023-12-22 DIAGNOSIS — M5441 Lumbago with sciatica, right side: Secondary | ICD-10-CM | POA: Diagnosis not present

## 2023-12-22 MED ORDER — MELOXICAM 7.5 MG PO TABS: 7.5000 mg | ORAL_TABLET | Freq: Every day | ORAL | 0 refills | Status: DC | PRN

## 2023-12-22 NOTE — Telephone Encounter (Signed)
Patient scheduled for 02/25 with Dr.Jones

## 2023-12-22 NOTE — Progress Notes (Signed)
[name] [address] [place] [address] Phone: [phone]   Assessment and Plan:     1. Right hip pain 2. Iliotibial band syndrome of right side 3. Chronic right-sided low back pain with right-sided sciatica -Chronic with exacerbation, subsequent visit - Recurrent flare of right leg pain including lateral right hip pain, posterior right hip pain, hamstring and calf tightness.  Previously thought to be flares of mild hip osteoarthritis, however based on physical exam today, x-rays partially showing degenerative changes in lumbar spine, patient may be experiencing radicular symptoms from lumbar degenerative changes.  Symptoms could also be multifactorial including greater trochanteric bursitis, IT band syndrome - Discussed imaging at today's visit.  Patient would like to start with conservative plan and then progress to imaging if no significant improvement - Start meloxicam 7.5 mg daily x2 weeks.  If still having pain after 2 weeks, complete 3rd-week of NSAID. May use remaining NSAID as needed once daily for pain control.  Do not to use additional over-the-counter NSAIDs (ibuprofen, naproxen, Advil, Aleve) while taking prescription NSAIDs.  May use Tylenol [phone] mg 2 to 3 times a day for breakthrough pain.  -Restart HEP  Pertinent previous records reviewed include none  Follow Up: 4 weeks for reevaluation.  If no improvement or worsening of symptoms, would obtain lumbar x-ray and could consider advanced imaging.  Could discuss physical therapy.   Subjective:   I, Maria Montgomery, am serving as a Neurosurgeon for Doctor Richardean Sale   Chief Complaint: right leg pain    HPI:    05/27/23 Patient is a 78 year old female complaining of right leg pain. Patient states that her pain started 10 days ago she was at the beach and she had to jerk herself to stop from falling. When she is hyper flexed the pain decreases. She went to  water aerobic and that helped. Pain started at her ankle and goes up to the low back . She states her calf pain is the worse. Ibu for the pain and that helps. She also rubs Voltaren cream and that isnt strong enough. She has pain when she first lifts her leg but once she gets the leg up the pain decreases    06/15/2023  Patient states that she is better    10/19/2023 Patient states pain isn't as bad just wanted an xray. Notices the pain when she is getting in and out of the car. Driving also causes pain as well .    12/22/2023 Patient states she is still getting calf pain and feels it when she goes up steps   Relevant Historical Information: Hypothyroidism, osteoporosis    Additional pertinent review of systems negative.   Current Outpatient Medications:    azelastine (OPTIVAR) 0.05 % ophthalmic solution, Place 1 drop into both eyes 2 (two) times daily., Disp: 6 mL, Rfl: 5   Bepotastine Besilate (BEPREVE) 1.5 % SOLN, Apply to eye., Disp: , Rfl:    BIOTIN PO, Take 10,000 mg by mouth., Disp: , Rfl:    Cetirizine HCl 10 MG CAPS, Take by mouth as needed. Reported on 11/21/2015, Disp: , Rfl:    chlorhexidine (PERIDEX) 0.12 % solution, , Disp: , Rfl:    Cholecalciferol (VITAMIN D PO), Take 5,000 Int'l Units by mouth., Disp: , Rfl:    denosumab (PROLIA) 60 MG/ML SOSY injection, Inject 60 mg into the skin every 6 (six) months., Disp: , Rfl:    EPINEPHrine 0.3 mg/0.3 mL IJ  SOAJ injection, , Disp: , Rfl:    estradiol (ESTRACE) 0.1 MG/GM vaginal cream, Place 1 g vaginally 2 (two) times a week., Disp: 42.5 g, Rfl: 2   fluticasone (FLONASE) 50 MCG/ACT nasal spray, SHAKE LQ AND U 1 TO 2 SPRAYS IEN QD, Disp: , Rfl:    levothyroxine (SYNTHROID) 50 MCG tablet, TAKE 1 TABLET(50 MCG) BY MOUTH DAILY BEFORE BREAKFAST, Disp: 90 tablet, Rfl: 1   mometasone (ELOCON) 0.1 % cream, Apply 1 application topically 2 (two) times daily., Disp: 45 g, Rfl: 0   montelukast (SINGULAIR) 10 MG tablet, Take 10 mg by mouth at  bedtime as needed., Disp: , Rfl:    Multiple Vitamins-Minerals (ZINC PO), Take by mouth., Disp: , Rfl:    rosuvastatin (CRESTOR) 5 MG tablet, Take 1 tablet (5 mg total) by mouth daily., Disp: 90 tablet, Rfl: 1   UNABLE TO FIND, Allergy shots 2 shots every 2 weeks, Disp: , Rfl:    zolpidem (AMBIEN) 5 MG tablet, Take 1 tablet (5 mg total) by mouth at bedtime as needed for sleep., Disp: 90 tablet, Rfl: 1   meloxicam (MOBIC) 7.5 MG tablet, Take 1 tablet (7.5 mg total) by mouth daily as needed for pain., Disp: 30 tablet, Rfl: 0  Current Facility-Administered Medications:    denosumab (PROLIA) injection 60 mg, 60 mg, Subcutaneous, Once, [name], MD   Objective:     Vitals:   12/22/23 1554  Pulse: 75  SpO2: 98%  Weight: 132 lb (59.9 kg)  Height: 5' (1.524 m)      Body mass index is 25.78 kg/m.    Physical Exam:    General: awake, alert, and oriented no acute distress, nontoxic Skin: no suspicious lesions or rashes Neuro:sensation intact distally with no deficits, normal muscle tone, no atrophy, strength 5/5 in all tested lower ext groups Psych: normal mood and affect, speech clear   Right leg/hip: No deformity, swelling or wasting ROM Flexion 90, ext 30, IR 45, ER 45 TTP greater trochanter, gluteal musculature, IT band, lateral gastrocnemius musculature NTTP over the hip flexors,   si joint, lumbar spine Negative log roll with FROM Negative FABER Positive FADIR for lateral hip pain Negative Piriformis test Negative trendelenberg Gait normal  Straight leg raise positive on right, negative on left No pain with lumbar flexion or extension  Electronically signed by:  Maria [name] 4:17 PM 12/22/23

## 2023-12-22 NOTE — Patient Instructions (Addendum)
-   Start meloxicam 7.5  mg daily x2 weeks.  If still having pain after 2 weeks, complete 3rd-week of NSAID. May use remaining NSAID as needed once daily for pain control.  Do not to use additional over-the-counter NSAIDs (ibuprofen, naproxen, Advil, Aleve) while taking prescription NSAIDs.  May use Tylenol [phone] mg 2 to 3 times a day for breakthrough pain. Continue HEP  4 week follow up

## 2023-12-23 ENCOUNTER — Ambulatory Visit: Payer: PPO | Admitting: Sports Medicine

## 2023-12-23 ENCOUNTER — Other Ambulatory Visit: Payer: Self-pay | Admitting: Internal Medicine

## 2023-12-23 DIAGNOSIS — F5101 Primary insomnia: Secondary | ICD-10-CM

## 2023-12-23 DIAGNOSIS — J3081 Allergic rhinitis due to animal (cat) (dog) hair and dander: Secondary | ICD-10-CM | POA: Diagnosis not present

## 2023-12-23 DIAGNOSIS — J301 Allergic rhinitis due to pollen: Secondary | ICD-10-CM | POA: Diagnosis not present

## 2023-12-23 DIAGNOSIS — J3089 Other allergic rhinitis: Secondary | ICD-10-CM | POA: Diagnosis not present

## 2023-12-29 ENCOUNTER — Other Ambulatory Visit (INDEPENDENT_AMBULATORY_CARE_PROVIDER_SITE_OTHER): Payer: PPO

## 2023-12-29 ENCOUNTER — Encounter: Payer: Self-pay | Admitting: Internal Medicine

## 2023-12-29 ENCOUNTER — Other Ambulatory Visit: Payer: Self-pay

## 2023-12-29 ENCOUNTER — Ambulatory Visit (INDEPENDENT_AMBULATORY_CARE_PROVIDER_SITE_OTHER): Payer: PPO | Admitting: Internal Medicine

## 2023-12-29 ENCOUNTER — Other Ambulatory Visit (HOSPITAL_COMMUNITY): Payer: Self-pay

## 2023-12-29 VITALS — BP 138/78 | HR 67 | Temp 97.7°F | Resp 16 | Ht 60.0 in | Wt 136.8 lb

## 2023-12-29 DIAGNOSIS — N1832 Chronic kidney disease, stage 3b: Secondary | ICD-10-CM | POA: Insufficient documentation

## 2023-12-29 DIAGNOSIS — M810 Age-related osteoporosis without current pathological fracture: Secondary | ICD-10-CM

## 2023-12-29 DIAGNOSIS — E785 Hyperlipidemia, unspecified: Secondary | ICD-10-CM

## 2023-12-29 DIAGNOSIS — E890 Postprocedural hypothyroidism: Secondary | ICD-10-CM | POA: Diagnosis not present

## 2023-12-29 DIAGNOSIS — E039 Hypothyroidism, unspecified: Secondary | ICD-10-CM | POA: Diagnosis not present

## 2023-12-29 DIAGNOSIS — I10 Essential (primary) hypertension: Secondary | ICD-10-CM | POA: Diagnosis not present

## 2023-12-29 LAB — TSH: TSH: 2.68 u[IU]/mL (ref 0.35–5.50)

## 2023-12-29 LAB — CBC WITH DIFFERENTIAL/PLATELET
Basophils Absolute: 0.1 10*3/uL (ref 0.0–0.1)
Basophils Relative: 0.8 % (ref 0.0–3.0)
Eosinophils Absolute: 0.2 10*3/uL (ref 0.0–0.7)
Eosinophils Relative: 2.4 % (ref 0.0–5.0)
HCT: 37.7 % (ref 36.0–46.0)
Hemoglobin: 12.6 g/dL (ref 12.0–15.0)
Lymphocytes Relative: 32.2 % (ref 12.0–46.0)
Lymphs Abs: 2.1 10*3/uL (ref 0.7–4.0)
MCHC: 33.5 g/dL (ref 30.0–36.0)
MCV: 95.5 fL (ref 78.0–100.0)
Monocytes Absolute: 0.9 10*3/uL (ref 0.1–1.0)
Monocytes Relative: 13.4 % — ABNORMAL HIGH (ref 3.0–12.0)
Neutro Abs: 3.4 10*3/uL (ref 1.4–7.7)
Neutrophils Relative %: 51.2 % (ref 43.0–77.0)
Platelets: 224 10*3/uL (ref 150.0–400.0)
RBC: 3.94 Mil/uL (ref 3.87–5.11)
RDW: 12.9 % (ref 11.5–15.5)
WBC: 6.7 10*3/uL (ref 4.0–10.5)

## 2023-12-29 LAB — CK: Total CK: 166 U/L (ref 7–177)

## 2023-12-29 LAB — BASIC METABOLIC PANEL
BUN: 20 mg/dL (ref 6–23)
CO2: 26 meq/L (ref 19–32)
Calcium: 9 mg/dL (ref 8.4–10.5)
Chloride: 106 meq/L (ref 96–112)
Creatinine, Ser: 0.91 mg/dL (ref 0.40–1.20)
GFR: 59.82 mL/min — ABNORMAL LOW (ref >60.00)
Glucose, Bld: 89 mg/dL (ref 70–99)
Potassium: 4.3 meq/L (ref 3.5–5.1)
Sodium: 140 meq/L (ref 135–145)

## 2023-12-29 MED ORDER — DENOSUMAB 60 MG/ML ~~LOC~~ SOSY
60.0000 mg | PREFILLED_SYRINGE | SUBCUTANEOUS | 0 refills | Status: DC
  Filled 2023-12-29 – 2023-12-30 (×2): qty 1, 180d supply, fill #0

## 2023-12-29 NOTE — Patient Instructions (Signed)

## 2023-12-29 NOTE — Progress Notes (Unsigned)
 Subjective:  Patient ID: Maria Montgomery, female    DOB: 1946-10-29  Age: 78 y.o. MRN: [id number]  CC: Hypertension, Hyperlipidemia, and Hypothyroidism   HPI Maria Montgomery presents for f/up ----  Discussed the use of AI scribe software for clinical note transcription with the patient, who gave verbal consent to proceed.  History of Present Illness   Maria Montgomery "Maria Montgomery" is a 78 year old female who presents with persistent pain in the right side of her body.  She has been experiencing persistent pain in the right side of her body since an injury in the fall while [employer]. The pain is described as likely muscular in nature. She has been taking meloxicam for two weeks, with tomorrow marking the start of her second week on the medication. Despite the pain, she remains active, engaging in aerobic swimming while in [place]. No chest pain, shortness of breath, palpitations, dizziness, or lightheadedness during exercise.  She is considering whether to continue with Prolia injections for osteoporosis management, as she has not been contacted for her next dose. She expresses some concern about potential side effects, particularly regarding the jaw, but notes that her dentist is aware of her treatment.  She occasionally experiences sleepiness in the late afternoon, especially when reading, which she attributes to her age. No thyroid-related symptoms, such as weight changes, constipation, or diarrhea. She mentions frequent morning bathroom visits, approximately three times, but is fine for the rest of the day.       Outpatient Medications Prior to Visit  Medication Sig Dispense Refill   azelastine (OPTIVAR) 0.05 % ophthalmic solution Place 1 drop into both eyes 2 (two) times daily. 6 mL 5   Bepotastine Besilate (BEPREVE) 1.5 % SOLN Apply to eye.     BIOTIN PO Take 10,000 mg by mouth.     Cetirizine HCl 10 MG CAPS Take by mouth as needed. Reported on 11/21/2015     Cholecalciferol  (VITAMIN D PO) Take 5,000 Int'l Units by mouth.     EPINEPHrine 0.3 mg/0.3 mL IJ SOAJ injection      estradiol (ESTRACE) 0.1 MG/GM vaginal cream Place 1 g vaginally 2 (two) times a week. 42.5 g 2   fluticasone (FLONASE) 50 MCG/ACT nasal spray SHAKE LQ AND U 1 TO 2 SPRAYS IEN QD     meloxicam (MOBIC) 7.5 MG tablet Take 1 tablet (7.5 mg total) by mouth daily as needed for pain. 30 tablet 0   mometasone (ELOCON) 0.1 % cream Apply 1 application topically 2 (two) times daily. 45 g 0   montelukast (SINGULAIR) 10 MG tablet Take 10 mg by mouth at bedtime as needed.     Multiple Vitamins-Minerals (ZINC PO) Take by mouth.     rosuvastatin (CRESTOR) 5 MG tablet Take 1 tablet (5 mg total) by mouth daily. 90 tablet 1   UNABLE TO FIND Allergy shots 2 shots every 2 weeks     zolpidem (AMBIEN) 5 MG tablet TAKE 1 TABLET(5 MG) BY MOUTH AT BEDTIME AS NEEDED FOR SLEEP 30 tablet 0   denosumab (PROLIA) 60 MG/ML SOSY injection Inject 60 mg into the skin every 6 (six) months.     levothyroxine (SYNTHROID) 50 MCG tablet TAKE 1 TABLET(50 MCG) BY MOUTH DAILY BEFORE BREAKFAST 90 tablet 1   chlorhexidine (PERIDEX) 0.12 % solution      denosumab (PROLIA) injection 60 mg      No facility-administered medications prior to visit.    ROS Review of Systems  Constitutional:  Positive for fatigue. Negative for appetite change, chills, diaphoresis and unexpected weight change.  HENT: Negative.    Respiratory: Negative.  Negative for chest tightness, shortness of breath and wheezing.   Cardiovascular:  Negative for chest pain, palpitations and leg swelling.  Gastrointestinal:  Negative for abdominal pain, blood in stool, constipation, diarrhea and vomiting.  Endocrine: Negative.  Negative for cold intolerance and heat intolerance.  Genitourinary: Negative.  Negative for difficulty urinating.  Musculoskeletal:  Positive for arthralgias. Negative for myalgias.  Skin: Negative.   Neurological: Negative.  Negative for dizziness.   Hematological:  Negative for adenopathy. Does not bruise/bleed easily.  Psychiatric/Behavioral: Negative.      Objective:  BP 138/78 (BP Location: Left Arm, Patient Position: Sitting, Cuff Size: Normal)   Pulse 67   Temp 97.7 F (36.5 C) (Oral)   Resp 16   Ht 5' (1.524 m)   Wt 136 lb 12.8 oz (62.1 kg)   LMP 07/28/1987   SpO2 97%   BMI 26.72 kg/m   BP Readings from Last 3 Encounters:  12/29/23 138/78  10/19/23 122/82  08/11/23 122/84    Wt Readings from Last 3 Encounters:  12/29/23 136 lb 12.8 oz (62.1 kg)  12/22/23 132 lb (59.9 kg)  10/19/23 139 lb (63 kg)    Physical Exam Vitals reviewed.  Constitutional:      Appearance: Normal appearance.  HENT:     Nose: Nose normal.     Mouth/Throat:     Mouth: Mucous membranes are moist.  Eyes:     General: No scleral icterus.    Conjunctiva/sclera: Conjunctivae normal.  Cardiovascular:     Rate and Rhythm: Normal rate and regular rhythm.     Heart sounds: No murmur heard.    No friction rub. No gallop.     Comments: EKG---  NSR, 69 bpm No LVH, Q waves, or ST/T wave changes  Pulmonary:     Effort: Pulmonary effort is normal.     Breath sounds: No stridor. No wheezing, rhonchi or rales.  Abdominal:     General: Abdomen is flat.     Palpations: There is no mass.     Tenderness: There is no abdominal tenderness. There is no guarding.     Hernia: No hernia is present.  Musculoskeletal:     Cervical back: Neck supple.     Right lower leg: No edema.     Left lower leg: No edema.  Lymphadenopathy:     Cervical: No cervical adenopathy.  Skin:    General: Skin is warm and dry.     Findings: No lesion.  Neurological:     General: No focal deficit present.     Mental Status: She is alert. Mental status is at baseline.  Psychiatric:        Mood and Affect: Mood normal.        Behavior: Behavior normal.     Lab Results  Component Value Date   WBC 6.7 12/29/2023   HGB 12.6 12/29/2023   HCT 37.7 12/29/2023   PLT  224.0 12/29/2023   GLUCOSE 89 12/29/2023   CHOL 152 06/15/2023   TRIG 65.0 06/15/2023   HDL 78.10 06/15/2023   LDLDIRECT 97.6 02/05/2009   LDLCALC 61 06/15/2023   ALT 15 06/15/2023   AST 20 06/15/2023   NA 140 12/29/2023   K 4.3 12/29/2023   CL 106 12/29/2023   CREATININE 0.91 12/29/2023   BUN 20 12/29/2023   CO2 26 12/29/2023  TSH 2.68 12/29/2023    DG Finger Ring Left Result Date: 04/22/2018 CLINICAL DATA:  Injured the fourth finger 3 weeks ago with pain and swelling of the PIP joint EXAM: LEFT RING FINGER 2+V COMPARISON:  Left hand films of 05/14/2015 FINDINGS: There may be a tiny nondisplaced avulsion from the distal aspect of the proximal phalanx of the left fourth digit with adjacent soft tissue swelling. No other acute abnormality is seen. Mild degenerative changes present involving the DIP and PIP joints IMPRESSION: 1. Questionable small avulsion fragment from the distal aspect of the proximal phalanx of the left fourth digit with soft tissue swelling. 2. Degenerative change of the DIP joints. Electronically Signed   By: Dwyane Dee M.D.   On: 04/22/2018 08:46    Assessment & Plan:  Age-related osteoporosis without current pathological fracture -     Denosumab; Inject 60 mg into the skin every 6 (six) months.  Dispense: 1 mL; Refill: 0  Acquired hypothyroidism  Hyperlipidemia LDL goal <100 -     CK; Future  Benign systolic hypertension- Her BP is well controlled.  Stage 3b chronic kidney disease (HCC)- Will start an SGLT-2 inh and I have asked her to discontinue the nsaid. -     Empagliflozin; Take 1 tablet (10 mg total) by mouth daily before breakfast.  Dispense: 90 tablet; Refill: 1  Postoperative hypothyroidism -     Levothyroxine Sodium; TAKE 1 TABLET(50 MCG) BY MOUTH DAILY BEFORE BREAKFAST  Dispense: 90 tablet; Refill: 1     Follow-up: Return in about 6 months (around 06/27/2024).  Sanda Linger, MD

## 2023-12-30 ENCOUNTER — Other Ambulatory Visit: Payer: Self-pay

## 2023-12-30 ENCOUNTER — Other Ambulatory Visit (HOSPITAL_COMMUNITY): Payer: Self-pay

## 2023-12-30 ENCOUNTER — Telehealth: Payer: Self-pay | Admitting: Internal Medicine

## 2023-12-30 MED ORDER — LEVOTHYROXINE SODIUM 50 MCG PO TABS: ORAL_TABLET | ORAL | 1 refills | Status: DC

## 2023-12-30 MED ORDER — EMPAGLIFLOZIN 10 MG PO TABS: 10.0000 mg | ORAL_TABLET | Freq: Every day | ORAL | 1 refills | Status: DC

## 2023-12-30 NOTE — Telephone Encounter (Signed)
 Copied from CRM [id number]. Topic: General - Other >> Dec 30, 2023  9:53 AM Truddie Crumble wrote: Reason for FIE:[id number] called stating she saw on mychart that she has mild kidney diease and was unaware because no one called her about it. Patient stated she has something at the pharmacy called [facility] that she would like to talk to the doctor about. Patient also stated the message said she need to stop taking meloxicam    CB [phone]

## 2023-12-30 NOTE — Progress Notes (Signed)
 Specialty Pharmacy Refill Coordination Note  Maria Montgomery is a 78 y.o. female contacted today regarding refills of specialty medication(s) Denosumab (PROLIA)   Patient requested Courier to Provider Office   Delivery date: 12/31/23   Verified address: [facility]  [address]   Medication will be filled on 02.26.25.

## 2023-12-31 ENCOUNTER — Other Ambulatory Visit: Payer: Self-pay

## 2023-12-31 NOTE — Addendum Note (Signed)
 Addended by: Daryll Brod on: 12/31/2023 03:45 PM   Modules accepted: Orders

## 2024-01-01 ENCOUNTER — Ambulatory Visit (INDEPENDENT_AMBULATORY_CARE_PROVIDER_SITE_OTHER): Payer: PPO

## 2024-01-01 DIAGNOSIS — M810 Age-related osteoporosis without current pathological fracture: Secondary | ICD-10-CM

## 2024-01-01 MED ORDER — DENOSUMAB 60 MG/ML ~~LOC~~ SOSY
60.0000 mg | PREFILLED_SYRINGE | SUBCUTANEOUS | Status: AC
Start: 2024-06-30 — End: 2024-06-30
  Administered 2024-06-30: 60 mg via SUBCUTANEOUS

## 2024-01-01 MED ORDER — DENOSUMAB 60 MG/ML ~~LOC~~ SOSY
60.0000 mg | PREFILLED_SYRINGE | SUBCUTANEOUS | Status: AC
  Administered 2024-01-01: 60 mg via SUBCUTANEOUS

## 2024-01-01 NOTE — Telephone Encounter (Signed)
 This has been addressed by other means of communication

## 2024-01-01 NOTE — Progress Notes (Signed)
 Patient is cleared for Prolia injection on or after 11/27/2023 per PA information on 11/06/2023.  Mentioned in provider note last on 12/29/23. Medication is Patient supplied. CoPay:$0   Patient presented in office today for her Prolia injection. I administered her injection into her Left arm (Sub-Q). Patient tolerated the injection well and the injection site looked fine. Advised patient to the office immediately if she notices any adverse reactions she gave a verbal understanding.   Future 6 month order for prolia has been placed.

## 2024-01-05 DIAGNOSIS — J3089 Other allergic rhinitis: Secondary | ICD-10-CM | POA: Diagnosis not present

## 2024-01-05 DIAGNOSIS — J301 Allergic rhinitis due to pollen: Secondary | ICD-10-CM | POA: Diagnosis not present

## 2024-01-07 DIAGNOSIS — J3089 Other allergic rhinitis: Secondary | ICD-10-CM | POA: Diagnosis not present

## 2024-01-07 DIAGNOSIS — J301 Allergic rhinitis due to pollen: Secondary | ICD-10-CM | POA: Diagnosis not present

## 2024-01-11 NOTE — Progress Notes (Unsigned)
 Aleen Sells [name] [address] [place] [address] Phone: [phone]   Assessment and Plan:     There are no diagnoses linked to this encounter.  ***   Pertinent previous records reviewed include ***    Follow Up: ***     Subjective:   I, Fergus Throne, am serving as a Neurosurgeon for Doctor Richardean Sale   Chief Complaint: right leg pain    HPI:    05/27/23 Patient is a 78 year old female complaining of right leg pain. Patient states that her pain started 10 days ago she was at the beach and she had to jerk herself to stop from falling. When she is hyper flexed the pain decreases. She went to water aerobic and that helped. Pain started at her ankle and goes up to the low back . She states her calf pain is the worse. Ibu for the pain and that helps. She also rubs Voltaren cream and that isnt strong enough. She has pain when she first lifts her leg but once she gets the leg up the pain decreases    06/15/2023  Patient states that she is better    10/19/2023 Patient states pain isn't as bad just wanted an xray. Notices the pain when she is getting in and out of the car. Driving also causes pain as well .    12/22/2023 Patient states she is still getting calf pain and feels it when she goes up steps   01/12/2024 Patient states   Relevant Historical Information: Hypothyroidism, osteoporosis    Additional pertinent review of systems negative.   Current Outpatient Medications:    azelastine (OPTIVAR) 0.05 % ophthalmic solution, Place 1 drop into both eyes 2 (two) times daily., Disp: 6 mL, Rfl: 5   Bepotastine Besilate (BEPREVE) 1.5 % SOLN, Apply to eye., Disp: , Rfl:    BIOTIN PO, Take 10,000 mg by mouth., Disp: , Rfl:    Cetirizine HCl 10 MG CAPS, Take by mouth as needed. Reported on 11/21/2015, Disp: , Rfl:    Cholecalciferol (VITAMIN D PO), Take 5,000 Int'l Units by mouth., Disp: , Rfl:    denosumab (PROLIA) 60 MG/ML SOSY  injection, Inject 60 mg into the skin every 6 (six) months., Disp: 1 mL, Rfl: 0   empagliflozin (JARDIANCE) 10 MG TABS tablet, Take 1 tablet (10 mg total) by mouth daily before breakfast., Disp: 90 tablet, Rfl: 1   EPINEPHrine 0.3 mg/0.3 mL IJ SOAJ injection, , Disp: , Rfl:    estradiol (ESTRACE) 0.1 MG/GM vaginal cream, Place 1 g vaginally 2 (two) times a week., Disp: 42.5 g, Rfl: 2   fluticasone (FLONASE) 50 MCG/ACT nasal spray, SHAKE LQ AND U 1 TO 2 SPRAYS IEN QD, Disp: , Rfl:    levothyroxine (SYNTHROID) 50 MCG tablet, TAKE 1 TABLET(50 MCG) BY MOUTH DAILY BEFORE BREAKFAST, Disp: 90 tablet, Rfl: 1   meloxicam (MOBIC) 7.5 MG tablet, Take 1 tablet (7.5 mg total) by mouth daily as needed for pain., Disp: 30 tablet, Rfl: 0   mometasone (ELOCON) 0.1 % cream, Apply 1 application topically 2 (two) times daily., Disp: 45 g, Rfl: 0   montelukast (SINGULAIR) 10 MG tablet, Take 10 mg by mouth at bedtime as needed., Disp: , Rfl:    Multiple Vitamins-Minerals (ZINC PO), Take by mouth., Disp: , Rfl:    rosuvastatin (CRESTOR) 5 MG tablet, Take 1 tablet (5 mg total) by mouth daily., Disp: 90 tablet, Rfl: 1  UNABLE TO FIND, Allergy shots 2 shots every 2 weeks, Disp: , Rfl:    zolpidem (AMBIEN) 5 MG tablet, TAKE 1 TABLET(5 MG) BY MOUTH AT BEDTIME AS NEEDED FOR SLEEP, Disp: 30 tablet, Rfl: 0  Current Facility-Administered Medications:    [START ON 06/30/2024] denosumab (PROLIA) injection 60 mg, 60 mg, Subcutaneous, Q6 months, [name], MD   Objective:     There were no vitals filed for this visit.    There is no height or weight on file to calculate BMI.    Physical Exam:    ***   Electronically signed by:  Aleen Sells [name] 3:21 PM 01/11/24

## 2024-01-12 ENCOUNTER — Ambulatory Visit: Admitting: Sports Medicine

## 2024-01-12 ENCOUNTER — Ambulatory Visit (INDEPENDENT_AMBULATORY_CARE_PROVIDER_SITE_OTHER)

## 2024-01-12 VITALS — BP 122/84 | HR 64 | Ht 60.0 in | Wt 136.0 lb

## 2024-01-12 DIAGNOSIS — M7631 Iliotibial band syndrome, right leg: Secondary | ICD-10-CM | POA: Diagnosis not present

## 2024-01-12 DIAGNOSIS — G8929 Other chronic pain: Secondary | ICD-10-CM | POA: Diagnosis not present

## 2024-01-12 DIAGNOSIS — M25551 Pain in right hip: Secondary | ICD-10-CM

## 2024-01-12 DIAGNOSIS — M25561 Pain in right knee: Secondary | ICD-10-CM | POA: Diagnosis not present

## 2024-01-12 DIAGNOSIS — M5441 Lumbago with sciatica, right side: Secondary | ICD-10-CM | POA: Diagnosis not present

## 2024-01-12 MED ORDER — METHYLPREDNISOLONE 4 MG PO TBPK: ORAL_TABLET | ORAL | 0 refills | Status: DC

## 2024-01-12 NOTE — Patient Instructions (Signed)
 Predisone dos pak Tylenol [phone] mg 2-3 times a day for pain relief  Continue HEP  Follow up after [place]

## 2024-01-13 DIAGNOSIS — Z85828 Personal history of other malignant neoplasm of skin: Secondary | ICD-10-CM | POA: Diagnosis not present

## 2024-01-13 DIAGNOSIS — L814 Other melanin hyperpigmentation: Secondary | ICD-10-CM | POA: Diagnosis not present

## 2024-01-13 DIAGNOSIS — L821 Other seborrheic keratosis: Secondary | ICD-10-CM | POA: Diagnosis not present

## 2024-01-13 DIAGNOSIS — L578 Other skin changes due to chronic exposure to nonionizing radiation: Secondary | ICD-10-CM | POA: Diagnosis not present

## 2024-01-19 ENCOUNTER — Ambulatory Visit: Payer: PPO | Admitting: Sports Medicine

## 2024-01-20 ENCOUNTER — Ambulatory Visit: Admitting: Podiatry

## 2024-01-25 ENCOUNTER — Ambulatory Visit: Admitting: Podiatry

## 2024-01-25 ENCOUNTER — Encounter: Payer: Self-pay | Admitting: Podiatry

## 2024-01-25 DIAGNOSIS — B070 Plantar wart: Secondary | ICD-10-CM

## 2024-01-25 NOTE — Progress Notes (Signed)
 Subjective:   Patient ID: Maria Montgomery, female   DOB: 78 y.o.   MRN: [id number]   HPI Patient presents with a painful lesion plantar aspect right which has come up over the last couple months and is sore to walk on   ROS      Objective:  Physical Exam  Neurovascular static intact with keratotic lesion plantar right that is painful to press with pinpoint bleeding upon debridement     Assessment:  Verruca plantaris plantar aspect right     Plan:  Sterile sharp debridement of the area applied chemical agent to create immune response with sterile dressing gave instructions on what to do if blistering were to occur and reappoint to recheck and evaluate

## 2024-01-26 DIAGNOSIS — J3089 Other allergic rhinitis: Secondary | ICD-10-CM | POA: Diagnosis not present

## 2024-01-26 DIAGNOSIS — J301 Allergic rhinitis due to pollen: Secondary | ICD-10-CM | POA: Diagnosis not present

## 2024-01-26 DIAGNOSIS — J3081 Allergic rhinitis due to animal (cat) (dog) hair and dander: Secondary | ICD-10-CM | POA: Diagnosis not present

## 2024-01-26 NOTE — Progress Notes (Deleted)
 Maria Montgomery [name] [address] [place] [address] Phone: [phone]   Assessment and Plan:     There are no diagnoses linked to this encounter.  ***   Pertinent previous records reviewed include ***    Follow Up: ***     Subjective:   I, Albertia Carvin, am serving as a Neurosurgeon for Doctor Richardean Sale   Chief Complaint: right leg pain    HPI:    05/27/23 Patient is a 78 year old female complaining of right leg pain. Patient states that her pain started 10 days ago she was at the beach and she had to jerk herself to stop from falling. When she is hyper flexed the pain decreases. She went to water aerobic and that helped. Pain started at her ankle and goes up to the low back . She states her calf pain is the worse. Ibu for the pain and that helps. She also rubs Voltaren cream and that isnt strong enough. She has pain when she first lifts her leg but once she gets the leg up the pain decreases    06/15/2023  Patient states that she is better    10/19/2023 Patient states pain isn't as bad just wanted an xray. Notices the pain when she is getting in and out of the car. Driving also causes pain as well .    12/22/2023 Patient states she is still getting calf pain and feels it when she goes up steps    01/12/2024 Patient states twisted her knee yesterday. Tylenol is not helping. RICES  01/27/2024 Patient states   Relevant Historical Information: Hypothyroidism, osteoporosis Additional pertinent review of systems negative.   Current Outpatient Medications:    azelastine (OPTIVAR) 0.05 % ophthalmic solution, Place 1 drop into both eyes 2 (two) times daily., Disp: 6 mL, Rfl: 5   Bepotastine Besilate (BEPREVE) 1.5 % SOLN, Apply to eye., Disp: , Rfl:    BIOTIN PO, Take 10,000 mg by mouth., Disp: , Rfl:    Cetirizine HCl 10 MG CAPS, Take by mouth as needed. Reported on 11/21/2015, Disp: , Rfl:    Cholecalciferol (VITAMIN D PO),  Take 5,000 Int'l Units by mouth., Disp: , Rfl:    denosumab (PROLIA) 60 MG/ML SOSY injection, Inject 60 mg into the skin every 6 (six) months., Disp: 1 mL, Rfl: 0   empagliflozin (JARDIANCE) 10 MG TABS tablet, Take 1 tablet (10 mg total) by mouth daily before breakfast., Disp: 90 tablet, Rfl: 1   EPINEPHrine 0.3 mg/0.3 mL IJ SOAJ injection, , Disp: , Rfl:    estradiol (ESTRACE) 0.1 MG/GM vaginal cream, Place 1 g vaginally 2 (two) times a week., Disp: 42.5 g, Rfl: 2   fluticasone (FLONASE) 50 MCG/ACT nasal spray, SHAKE LQ AND U 1 TO 2 SPRAYS IEN QD, Disp: , Rfl:    levothyroxine (SYNTHROID) 50 MCG tablet, TAKE 1 TABLET(50 MCG) BY MOUTH DAILY BEFORE BREAKFAST, Disp: 90 tablet, Rfl: 1   methylPREDNISolone (MEDROL DOSEPAK) 4 MG TBPK tablet, Take 6 tablets on day 1.  Take 5 tablets on day 2.  Take 4 tablets on day 3.  Take 3 tablets on day 4.  Take 2 tablets on day 5.  Take 1 tablet on day 6., Disp: 21 tablet, Rfl: 0   mometasone (ELOCON) 0.1 % cream, Apply 1 application topically 2 (two) times daily., Disp: 45 g, Rfl: 0   montelukast (SINGULAIR) 10 MG tablet, Take 10 mg by mouth at bedtime as  needed., Disp: , Rfl:    rosuvastatin (CRESTOR) 5 MG tablet, Take 1 tablet (5 mg total) by mouth daily., Disp: 90 tablet, Rfl: 1   UNABLE TO FIND, Allergy shots 2 shots every 2 weeks, Disp: , Rfl:    zolpidem (AMBIEN) 5 MG tablet, TAKE 1 TABLET(5 MG) BY MOUTH AT BEDTIME AS NEEDED FOR SLEEP, Disp: 30 tablet, Rfl: 0  Current Facility-Administered Medications:    [START ON 06/30/2024] denosumab (PROLIA) injection 60 mg, 60 mg, Subcutaneous, Q6 months, [name], MD   Objective:     There were no vitals filed for this visit.    There is no height or weight on file to calculate BMI.    Physical Exam:    ***   Electronically signed by:  Maria Montgomery [name] 7:38 AM 01/26/24

## 2024-01-27 ENCOUNTER — Ambulatory Visit: Admitting: Sports Medicine

## 2024-01-28 ENCOUNTER — Other Ambulatory Visit: Payer: Self-pay | Admitting: Internal Medicine

## 2024-01-28 ENCOUNTER — Encounter: Payer: Self-pay | Admitting: Sports Medicine

## 2024-01-28 DIAGNOSIS — F5101 Primary insomnia: Secondary | ICD-10-CM

## 2024-02-01 ENCOUNTER — Other Ambulatory Visit: Payer: Self-pay | Admitting: Internal Medicine

## 2024-02-01 DIAGNOSIS — F5101 Primary insomnia: Secondary | ICD-10-CM

## 2024-02-01 NOTE — Telephone Encounter (Signed)
 Copied from CRM [id number]. Topic: Clinical - Medication Refill >> Feb 01, 2024  9:38 AM Truddie Crumble wrote: Most Recent Primary Care Visit:  Provider: Daryll Brod  Department: [facility]  Visit Type: CLINICAL SUPPORT  Date: 01/01/2024  Medication: zolpidem (AMBIEN) 5 MG tablet  Has the patient contacted their pharmacy? Yes (Agent: If no, request that the patient contact the pharmacy for the refill. If patient does not wish to contact the pharmacy document the reason why and proceed with request.) (Agent: If yes, when and what did the pharmacy advise?)  Is this the correct pharmacy for this prescription? Yes If no, delete pharmacy and type the correct one.  This is the patient's preferred pharmacy:  [id number] - [place], [place] - [address] AT [facility] [address] [place] [address] Phone: [phone] Fax: [phone]  Has the prescription been filled recently? No  Is the patient out of the medication? Yes  Has the patient been seen for an appointment in the last year OR does the patient have an upcoming appointment? Yes  Can we respond through MyChart? Yes  Agent: Please be advised that Rx refills may take up to 3 business days. We ask that you follow-up with your pharmacy.

## 2024-02-01 NOTE — Telephone Encounter (Signed)
 Duplicate request. Medication, Remus Loffler, reordered today. Closing encounter.

## 2024-02-10 ENCOUNTER — Ambulatory Visit (INDEPENDENT_AMBULATORY_CARE_PROVIDER_SITE_OTHER)

## 2024-02-10 VITALS — Ht 60.0 in | Wt 134.0 lb

## 2024-02-10 DIAGNOSIS — Z0000 Encounter for general adult medical examination without abnormal findings: Secondary | ICD-10-CM | POA: Diagnosis not present

## 2024-02-10 NOTE — Patient Instructions (Signed)
 Maria Montgomery , Thank you for taking time to come for your Medicare Wellness Visit. I appreciate your ongoing commitment to your health goals. Please review the following plan we discussed and let me know if I can assist you in the future.   Referrals/Orders/Follow-Ups/Clinician Recommendations: It was nice talking with you today.  Keep up the good work.    This is a list of the screening recommended for you and due dates:  Health Maintenance  Topic Date Due   COVID-19 Vaccine (8 - 2024-25 season) 09/22/2023   Mammogram  04/29/2024   Flu Shot  06/03/2024   DEXA scan (bone density measurement)  08/14/2024   Medicare Annual Wellness Visit  02/09/2025   DTaP/Tdap/Td vaccine (3 - Td or Tdap) 03/02/2030   Pneumonia Vaccine  Completed   Zoster (Shingles) Vaccine  Completed   HPV Vaccine  Aged Out   Colon Cancer Screening  Discontinued   Hepatitis C Screening  Discontinued    Advanced directives: (Copy Requested) Please bring a copy of your health care power of attorney and living will to the office to be added to your chart at your convenience. You can mail to [facility] [address] [place], [place] [address] or email to [email or web address]  Next Medicare Annual Wellness Visit scheduled for next year: Yes

## 2024-02-10 NOTE — Progress Notes (Signed)
 Subjective:   Maria Montgomery is a 78 y.o. who presents for a Medicare Wellness preventive visit.  Visit Complete: Virtual I connected with  Trinda Pascal on 02/10/24 by a audio enabled telemedicine application and verified that I am speaking with the correct person using two identifiers.  Patient Location: Home  Provider Location: Home Office  I discussed the limitations of evaluation and management by telemedicine. The patient expressed understanding and agreed to proceed.  Vital Signs: Because this visit was a virtual/telehealth visit, some criteria may be missing or patient reported. Any vitals not documented were not able to be obtained and vitals that have been documented are patient reported.  VideoDeclined- This patient declined Librarian, academic. Therefore the visit was completed with audio only.  Persons Participating in Visit: Patient.  AWV Questionnaire: No: Patient Medicare AWV questionnaire was not completed prior to this visit.  Cardiac Risk Factors include: advanced age (>62men, >2 women);Other (see comment);dyslipidemia, Risk factor comments: CKD stage 3     Objective:    Today's Vitals   02/10/24 1348  Weight: 134 lb (60.8 kg)  Height: 5' (1.524 m)   Body mass index is 26.17 kg/m.     02/10/2024    2:00 PM 10/16/2021    3:43 PM 07/20/2020   11:42 AM 04/25/2017    3:35 PM 04/21/2016    7:47 AM  Advanced Directives  Does Patient Have a Medical Advance Directive? Yes Yes Yes Yes Yes  Type of Estate agent of [place];Living will Living will;Healthcare Power of [employer] Power of [place];Living will Healthcare Power of [place];Living will Living will;Healthcare Power of Attorney  Does patient want to make changes to medical advance directive?  No - Patient declined No - Patient declined No - Patient declined No - Patient declined  Copy of Healthcare Power of Attorney in Chart? No - copy requested No -  copy requested No - copy requested Yes Yes    Current Medications (verified) Outpatient Encounter Medications as of 02/10/2024  Medication Sig   azelastine (OPTIVAR) 0.05 % ophthalmic solution Place 1 drop into both eyes 2 (two) times daily.   Bepotastine Besilate (BEPREVE) 1.5 % SOLN Apply to eye.   BIOTIN PO Take 10,000 mg by mouth.   Cetirizine HCl 10 MG CAPS Take by mouth as needed. Reported on 11/21/2015   Cholecalciferol (VITAMIN D PO) Take 5,000 Int'l Units by mouth.   denosumab (PROLIA) 60 MG/ML SOSY injection Inject 60 mg into the skin every 6 (six) months.   empagliflozin (JARDIANCE) 10 MG TABS tablet Take 1 tablet (10 mg total) by mouth daily before breakfast.   EPINEPHrine 0.3 mg/0.3 mL IJ SOAJ injection    estradiol (ESTRACE) 0.1 MG/GM vaginal cream Place 1 g vaginally 2 (two) times a week.   fluticasone (FLONASE) 50 MCG/ACT nasal spray SHAKE LQ AND U 1 TO 2 SPRAYS IEN QD   levothyroxine (SYNTHROID) 50 MCG tablet TAKE 1 TABLET(50 MCG) BY MOUTH DAILY BEFORE BREAKFAST   methylPREDNISolone (MEDROL DOSEPAK) 4 MG TBPK tablet Take 6 tablets on day 1.  Take 5 tablets on day 2.  Take 4 tablets on day 3.  Take 3 tablets on day 4.  Take 2 tablets on day 5.  Take 1 tablet on day 6.   mometasone (ELOCON) 0.1 % cream Apply 1 application topically 2 (two) times daily.   montelukast (SINGULAIR) 10 MG tablet Take 10 mg by mouth at bedtime as needed.   rosuvastatin (CRESTOR)  5 MG tablet Take 1 tablet (5 mg total) by mouth daily.   UNABLE TO FIND Allergy shots 2 shots every 2 weeks   zolpidem (AMBIEN) 5 MG tablet TAKE 1 TABLET(5 MG) BY MOUTH AT BEDTIME AS NEEDED FOR SLEEP   Facility-Administered Encounter Medications as of 02/10/2024  Medication   [START ON 06/30/2024] denosumab (PROLIA) injection 60 mg    Allergies (verified) Morphine and codeine   History: Past Medical History:  Diagnosis Date   Allergic rhinitis    Blood transfusion    after tonsillectomy as child; and after hysterectomy    Diverticulosis 11/03/2001   Hypothyroidism 12/04/2006   Osteopenia    Vitamin D deficiency    Past Surgical History:  Procedure Laterality Date   ABDOMINAL HYSTERECTOMY     BLEPHAROPLASTY     BUNIONECTOMY     hammertoe right '01, hammertoe left '06   COLONOSCOPY  06/15/2012   TONSILLECTOMY     TUBAL LIGATION     VAGINAL HYSTERECTOMY  1988   had prolapse and blood transfusion   Family History  Problem Relation Age of Onset   Coronary artery disease Mother    Breast cancer Mother    Thyroid disease Mother    Hypertension Mother    Osteoporosis Mother    Lung cancer Father        age 5   Asthma Brother    Epilepsy Brother    Diabetes Paternal Uncle    Diabetes Maternal Grandmother    Multiple births Paternal Grandmother    Colon cancer Neg Hx    Stomach cancer Neg Hx    Social History   Socioeconomic History   Marital status: Married    Spouse name: Luisa Hart   Number of children: Not on file   Years of education: Not on file   Highest education level: Not on file  Occupational History   Occupation: RETIRED  Tobacco Use   Smoking status: Never   Smokeless tobacco: Never  Vaping Use   Vaping status: Never Used  Substance and Sexual Activity   Alcohol use: Yes    Alcohol/week: 7.0 standard drinks of alcohol    Types: 7 Standard drinks or equivalent per week   Drug use: No   Sexual activity: Yes    Birth control/protection: Surgical, Post-menopausal    Comment: hysterectomy-1st intercourse 20yo-1 partner  Other Topics Concern   Not on file  Social History Narrative   College grad- elementary education.  Married '67  Son-'69; Daughter- '71; 5 grandchildren. Very active in the community. Marriage is in good health      Lives with husband/2025   Social Drivers of Health   Financial Resource Strain: Low Risk  (10/16/2021)   Overall Financial Resource Strain (CARDIA)    Difficulty of Paying Living Expenses: Not hard at all  Food Insecurity: No Food  Insecurity (10/16/2021)   Hunger Vital Sign    Worried About Running Out of Food in the Last Year: Never true    Ran Out of Food in the Last Year: Never true  Transportation Needs: No Transportation Needs (10/16/2021)   PRAPARE - Administrator, Civil Service (Medical): No    Lack of Transportation (Non-Medical): No  Physical Activity: Sufficiently Active (10/16/2021)   Exercise Vital Sign    Days of Exercise per Week: 5 days    Minutes of Exercise per Session: 60 min  Stress: No Stress Concern Present (10/16/2021)   [employer] of Occupational Health - Occupational Stress  Questionnaire    Feeling of Stress : Not at all  Social Connections: Socially Integrated (10/16/2021)   Social Connection and Isolation Panel [NHANES]    Frequency of Communication with Friends and Family: More than three times a week    Frequency of Social Gatherings with Friends and Family: More than three times a week    Attends Religious Services: More than 4 times per year    Active Member of [employer] or Organizations: Yes    Attends Engineer, structural: More than 4 times per year    Marital Status: Married    Tobacco Counseling Counseling given: Not Answered    Clinical Intake:  Pre-visit preparation completed: Yes  Pain : No/denies pain     BMI - recorded: 26.17 Nutritional Status: BMI 25 -29 Overweight Nutritional Risks: None Diabetes: No  No results found for: "HGBA1C"   How often do you need to have someone help you when you read instructions, pamphlets, or other written materials from your doctor or pharmacy?: 1 - Never  Interpreter Needed?: No  Information entered by :: Anthoni Geerts, RMA   Activities of Daily Living     02/10/2024    1:48 PM  In your present state of health, do you have any difficulty performing the following activities:  Hearing? 0  Vision? 0  Difficulty concentrating or making decisions? 0  Walking or climbing stairs? 0  Dressing or  bathing? 0  Doing errands, shopping? 0  Preparing Food and eating ? N  Using the Toilet? N  In the past six months, have you accidently leaked urine? N  Do you have problems with loss of bowel control? N  Managing your Medications? N  Managing your Finances? N  Housekeeping or managing your Housekeeping? N    Patient Care Team: [name], MD as PCP - General (Internal Medicine) Romine, Edwena Felty, MD (Obstetrics and Gynecology) Lenn Sink, DPM (Podiatry) Arminda Resides, MD (Dermatology) Eileen Stanford, MD (Gynecology) Kathyrn Sheriff, [facility] (Inactive) as Pharmacist (Pharmacist) Patton Salles, MD as Consulting Physician (Obstetrics and Gynecology) Jethro Bolus, MD as Consulting Physician (Ophthalmology)  Indicate any recent Medical Services you may have received from other than [facility] providers in the past year (date may be approximate).     Assessment:   This is a routine wellness examination for [facility].  Hearing/Vision screen Hearing Screening - Comments:: Denies hearing difficulties   Vision Screening - Comments:: Wears contacts for reading   Goals Addressed               This Visit's Progress     Patient Stated (pt-stated)   On track     My goal is to increase my physical activity and travel more.       Depression Screen     12/29/2023    8:13 AM 06/15/2023   10:47 AM 06/11/2022    8:35 AM 10/16/2021    3:47 PM 07/20/2020   11:42 AM 11/22/2019    9:33 AM 04/25/2018    6:08 PM  PHQ 2/9 Scores  PHQ - 2 Score 0 0 0 0 0 0 0  PHQ- 9 Score  0 2        Fall Risk     02/10/2024    2:01 PM 12/29/2023    8:12 AM 08/11/2023    9:32 AM 02/19/2023    9:11 AM 06/11/2022    8:35 AM  Fall Risk   Falls in the past year?  0 0 0 0 1  Number falls in past yr: 0 0 0 0 0  Injury with Fall? 0 0 0 0 0  Risk for fall due to : No Fall Risks No Fall Risks   History of fall(s)  Follow up Falls prevention discussed;Falls evaluation completed Falls evaluation  completed Falls evaluation completed  Falls evaluation completed    MEDICARE RISK AT HOME:  Medicare Risk at Home Any stairs in or around the home?: Yes If so, are there any without handrails?: Yes Home free of loose throw rugs in walkways, pet beds, electrical cords, etc?: Yes Adequate lighting in your home to reduce risk of falls?: Yes Life alert?: No Use of a cane, walker or w/c?: No Grab bars in the bathroom?: Yes Shower chair or bench in shower?: Yes Elevated toilet seat or a handicapped toilet?: Yes  TIMED UP AND GO:  Was the test performed?  No  Cognitive Function: 6CIT completed        02/10/2024    2:01 PM 07/20/2020   11:45 AM  6CIT Screen  What Year? 0 points 0 points  What month?  0 points  What time?  0 points  Count back from 20  0 points  Months in reverse  0 points  Repeat phrase  0 points  Total Score  0 points    Immunizations Immunization History  Administered Date(s) Administered   Fluad Trivalent(High Dose 65+) 08/11/2023   Influenza Split 09/10/2011   Influenza Whole 08/21/2008, 08/09/2009   Influenza, High Dose Seasonal PF 09/01/2018   Influenza, Quadrivalent, Recombinant, Inj, Pf 07/19/2019   Influenza,inj,Quad PF,6+ Mos 07/29/2017   Influenza-Unspecified 09/03/2013, 08/13/2016   PFIZER Comirnaty(Gray Top)Covid-19 Tri-Sucrose Vaccine 03/21/2021   PFIZER(Purple Top)SARS-COV-2 Vaccination 11/19/2019, 12/07/2019, 07/03/2020, 09/10/2020, 03/21/2021   Pfizer Covid-19 Vaccine Bivalent Booster 85yrs & up 07/28/2023   Pneumococcal Conjugate-13 04/12/2014   Pneumococcal Polysaccharide-23 08/25/2007, 10/16/2014, 04/17/2016, 09/13/2018, 09/10/2020   Td 02/11/2010   Tdap 03/02/2020   Zoster Recombinant(Shingrix) 07/29/2017, 03/07/2018   Zoster, Live 12/23/2006, 12/29/2006    Screening Tests Health Maintenance  Topic Date Due   COVID-19 Vaccine (8 - 2024-25 season) 09/22/2023   MAMMOGRAM  04/29/2024   INFLUENZA VACCINE  06/03/2024   DEXA SCAN   08/14/2024   Medicare Annual Wellness (AWV)  02/09/2025   DTaP/Tdap/Td (3 - Td or Tdap) 03/02/2030   Pneumonia Vaccine 38+ Years old  Completed   Zoster Vaccines- Shingrix  Completed   HPV VACCINES  Aged Out   Colonoscopy  Discontinued   Hepatitis C Screening  Discontinued    Health Maintenance  Health Maintenance Due  Topic Date Due   COVID-19 Vaccine (8 - 2024-25 season) 09/22/2023   Health Maintenance Items Addressed: See Nurse Notes  Additional Screening:  Vision Screening: Recommended annual ophthalmology exams for early detection of glaucoma and other disorders of the eye.  Dental Screening: Recommended annual dental exams for proper oral hygiene  Community Resource Referral / Chronic Care Management: CRR required this visit?  No   CCM required this visit?  No     Plan:     I have personally reviewed and noted the following in the patient's chart:   Medical and social history Use of alcohol, tobacco or illicit drugs  Current medications and supplements including opioid prescriptions. Patient is not currently taking opioid prescriptions. Functional ability and status Nutritional status Physical activity Advanced directives List of other physicians Hospitalizations, surgeries, and ER visits in previous 12 months Vitals Screenings to include  cognitive, depression, and falls Referrals and appointments  In addition, I have reviewed and discussed with patient certain preventive protocols, quality metrics, and best practice recommendations. A written personalized care plan for preventive services as well as general preventive health recommendations were provided to patient.     Maghan Jessee L Eller Sweis, CMA   02/10/2024   After Visit Summary: (MyChart) Due to this being a telephonic visit, the after visit summary with patients personalized plan was offered to patient via MyChart   Notes: Please refer to Routing Comments.

## 2024-02-24 DIAGNOSIS — J3089 Other allergic rhinitis: Secondary | ICD-10-CM | POA: Diagnosis not present

## 2024-02-24 DIAGNOSIS — J301 Allergic rhinitis due to pollen: Secondary | ICD-10-CM | POA: Diagnosis not present

## 2024-02-29 ENCOUNTER — Other Ambulatory Visit: Payer: Self-pay | Admitting: Internal Medicine

## 2024-02-29 DIAGNOSIS — E785 Hyperlipidemia, unspecified: Secondary | ICD-10-CM

## 2024-03-07 ENCOUNTER — Other Ambulatory Visit: Payer: Self-pay | Admitting: Internal Medicine

## 2024-03-07 DIAGNOSIS — E785 Hyperlipidemia, unspecified: Secondary | ICD-10-CM

## 2024-03-07 NOTE — Telephone Encounter (Signed)
 Copied from CRM [id number]. Topic: Clinical - Medication Refill >> Mar 07, 2024 11:07 AM Tanazia G wrote: Most Recent Primary Care Visit:  Provider: TYUS, BRENDELL L  Department: [facility]  Visit Type: ANNUAL WELL VISIT, SEQUENTIAL  Date: 02/10/2024  Medication: rosuvastatin  (CRESTOR ) 5 MG tablet  Has the patient contacted their pharmacy? Yes (Agent: If no, request that the patient contact the pharmacy for the refill. If patient does not wish to contact the pharmacy document the reason why and proceed with request.) (Agent: If yes, when and what did the pharmacy advise?)  Is this the correct pharmacy for this prescription? Yes If no, delete pharmacy and type the correct one.  This is the patient's preferred pharmacy:  [id number] - , [place] - [address] AT [facility] OF [address] & [address] [place] [address] Phone: [phone] Fax: [phone]  [place] [address] [place] [address] Phone: [phone] Fax: [phone]   Has the prescription been filled recently? Yes  Is the patient out of the medication? Yes  Has the patient been seen for an appointment in the last year OR does the patient have an upcoming appointment? Yes  Can we respond through MyChart? Yes  Agent: Please be advised that Rx refills may take up to 3 business days. We ask that you follow-up with your pharmacy.

## 2024-03-10 MED ORDER — ROSUVASTATIN CALCIUM 5 MG PO TABS: 5.0000 mg | ORAL_TABLET | Freq: Every day | ORAL | 1 refills | Status: DC

## 2024-03-14 DIAGNOSIS — J3089 Other allergic rhinitis: Secondary | ICD-10-CM | POA: Diagnosis not present

## 2024-03-14 DIAGNOSIS — J301 Allergic rhinitis due to pollen: Secondary | ICD-10-CM | POA: Diagnosis not present

## 2024-03-14 DIAGNOSIS — J3081 Allergic rhinitis due to animal (cat) (dog) hair and dander: Secondary | ICD-10-CM | POA: Diagnosis not present

## 2024-04-04 DIAGNOSIS — J3081 Allergic rhinitis due to animal (cat) (dog) hair and dander: Secondary | ICD-10-CM | POA: Diagnosis not present

## 2024-04-04 DIAGNOSIS — J301 Allergic rhinitis due to pollen: Secondary | ICD-10-CM | POA: Diagnosis not present

## 2024-04-04 DIAGNOSIS — J3089 Other allergic rhinitis: Secondary | ICD-10-CM | POA: Diagnosis not present

## 2024-04-05 ENCOUNTER — Telehealth: Payer: Self-pay | Admitting: Internal Medicine

## 2024-04-05 NOTE — Telephone Encounter (Unsigned)
 Copied from CRM [id number]. Topic: Clinical - Prescription Issue >> Apr 05, 2024  9:38 AM Vivian Z wrote: Reason for CRM: Maria Montgomery called from [facility] to request the latest H&P and stated that she faxed over a confidential medical profile which needs to be completed by Dr. Rochelle Chu.   Fax: [phone] Call back: [phone]

## 2024-04-07 NOTE — Telephone Encounter (Signed)
 Copied from CRM [id number]. Topic: Medical Record Request - Other >> Apr 07, 2024 12:27 PM Alyse July wrote: Reason for CRM: patient checking on the status of medical records request. >> Apr 07, 2024 12:40 PM Magdalene School wrote: Patient calling to request that her medical records are faxed over to [facility]. She stated that when she called earlier she was given the medical records phone number but no one answered when she called but she left a message for them as well.

## 2024-04-07 NOTE — Telephone Encounter (Signed)
 Copied from CRM [id number]. Topic: General - Other >> Apr 07, 2024 11:35 AM Juleen Oakland F wrote: Reason for CRM: Rice Chamorro from [facility] calling to follow up on paperwokr that was faxed over 04/04/24, making sure it was received and is being faxed back over. Her call back number is [phone]

## 2024-04-11 NOTE — Telephone Encounter (Signed)
 Copied from CRM [id number]. Topic: Clinical - Medical Advice >> Apr 11, 2024  2:31 PM Keitha Pata L wrote: Reason for CRM: Rice Chamorro peoples from [facility]  sent a fax on 06/03 for the Lastest hmp

## 2024-04-11 NOTE — Telephone Encounter (Signed)
 Paperwork has been received and placed in provider's box to be completed.

## 2024-04-13 DIAGNOSIS — J3081 Allergic rhinitis due to animal (cat) (dog) hair and dander: Secondary | ICD-10-CM | POA: Diagnosis not present

## 2024-04-13 DIAGNOSIS — J3089 Other allergic rhinitis: Secondary | ICD-10-CM | POA: Diagnosis not present

## 2024-04-13 DIAGNOSIS — J301 Allergic rhinitis due to pollen: Secondary | ICD-10-CM | POA: Diagnosis not present

## 2024-04-14 NOTE — Telephone Encounter (Signed)
 I have the forms. They are completed and have been faxed.

## 2024-04-14 NOTE — Telephone Encounter (Signed)
 Copied from CRM [id number]. Topic: Medical Record Request - Provider/Facility Request >> Apr 14, 2024 10:02 AM Alyse July wrote: Reason for CRM: Amil Kale from [facility] would like a call back pertaining to documents for patient that was faxed over on 06/02 and 06/09. Caller mention that receipt of information was urgent. Please contact Rice Chamorro once completed. CB# [phone]

## 2024-04-15 NOTE — Telephone Encounter (Signed)
 Copied from CRM [id number]. Topic: Medical Record Request - Other >> Apr 15, 2024  8:03 AM Deaijah H wrote: Reason for CRM: Patient would like to know if her medical record has been sent to [facility]. Stated she needs to know today. Please call [phone] to confirm  ---  Called pt and made her aware forms have been faxed.  TDW

## 2024-04-18 DIAGNOSIS — J3089 Other allergic rhinitis: Secondary | ICD-10-CM | POA: Diagnosis not present

## 2024-04-18 DIAGNOSIS — J3081 Allergic rhinitis due to animal (cat) (dog) hair and dander: Secondary | ICD-10-CM | POA: Diagnosis not present

## 2024-04-18 DIAGNOSIS — J301 Allergic rhinitis due to pollen: Secondary | ICD-10-CM | POA: Diagnosis not present

## 2024-04-26 DIAGNOSIS — J3089 Other allergic rhinitis: Secondary | ICD-10-CM | POA: Diagnosis not present

## 2024-04-26 DIAGNOSIS — J301 Allergic rhinitis due to pollen: Secondary | ICD-10-CM | POA: Diagnosis not present

## 2024-04-26 DIAGNOSIS — J3081 Allergic rhinitis due to animal (cat) (dog) hair and dander: Secondary | ICD-10-CM | POA: Diagnosis not present

## 2024-04-29 ENCOUNTER — Encounter: Payer: Self-pay | Admitting: Podiatry

## 2024-04-29 ENCOUNTER — Ambulatory Visit: Admitting: Podiatry

## 2024-04-29 DIAGNOSIS — B070 Plantar wart: Secondary | ICD-10-CM

## 2024-04-30 DIAGNOSIS — Z1231 Encounter for screening mammogram for malignant neoplasm of breast: Secondary | ICD-10-CM | POA: Diagnosis not present

## 2024-04-30 LAB — HM MAMMOGRAPHY

## 2024-04-30 NOTE — Progress Notes (Signed)
 Subjective:   Patient ID: Maria Montgomery, female   DOB: 78 y.o.   MRN: [id number]   HPI Patient presents with very painful lesion plantar aspect right foot and states that it got better for several months but is reoccurring   ROS      Objective:  Physical Exam  Neurovascular status intact with lesion plantar right that does appear to have pinpoint bleeding upon debridement and painful to lateral pressure     Assessment:  Probability for verruca plantaris plantar right foot that is painful     Plan:  H&P reviewed and at this point I went ahead and did deep debridement of the lesion applied chemical agent to create immune response applied sterile dressing and advised on soaks and popping any blister which may occur associated with it

## 2024-05-04 ENCOUNTER — Encounter: Payer: Self-pay | Admitting: Internal Medicine

## 2024-05-10 DIAGNOSIS — J301 Allergic rhinitis due to pollen: Secondary | ICD-10-CM | POA: Diagnosis not present

## 2024-05-10 DIAGNOSIS — J3081 Allergic rhinitis due to animal (cat) (dog) hair and dander: Secondary | ICD-10-CM | POA: Diagnosis not present

## 2024-05-10 DIAGNOSIS — J3089 Other allergic rhinitis: Secondary | ICD-10-CM | POA: Diagnosis not present

## 2024-05-12 ENCOUNTER — Ambulatory Visit: Admitting: Podiatry

## 2024-05-16 DIAGNOSIS — J301 Allergic rhinitis due to pollen: Secondary | ICD-10-CM | POA: Diagnosis not present

## 2024-05-16 DIAGNOSIS — J3089 Other allergic rhinitis: Secondary | ICD-10-CM | POA: Diagnosis not present

## 2024-05-16 DIAGNOSIS — J3081 Allergic rhinitis due to animal (cat) (dog) hair and dander: Secondary | ICD-10-CM | POA: Diagnosis not present

## 2024-05-21 ENCOUNTER — Other Ambulatory Visit: Payer: Self-pay | Admitting: Internal Medicine

## 2024-05-21 DIAGNOSIS — F5101 Primary insomnia: Secondary | ICD-10-CM

## 2024-05-23 DIAGNOSIS — J3081 Allergic rhinitis due to animal (cat) (dog) hair and dander: Secondary | ICD-10-CM | POA: Diagnosis not present

## 2024-05-23 DIAGNOSIS — J301 Allergic rhinitis due to pollen: Secondary | ICD-10-CM | POA: Diagnosis not present

## 2024-05-23 DIAGNOSIS — J3089 Other allergic rhinitis: Secondary | ICD-10-CM | POA: Diagnosis not present

## 2024-05-24 NOTE — Telephone Encounter (Signed)
 Requesting: Ambien  5 mg Contract: N/A UDS: N/A Last Visit: 12/29/2023 Next Visit: 06/20/2024 Last Refill: 02/01/2024  Please Advise

## 2024-05-30 ENCOUNTER — Telehealth: Payer: Self-pay

## 2024-05-30 DIAGNOSIS — J3089 Other allergic rhinitis: Secondary | ICD-10-CM | POA: Diagnosis not present

## 2024-05-30 DIAGNOSIS — J301 Allergic rhinitis due to pollen: Secondary | ICD-10-CM | POA: Diagnosis not present

## 2024-05-30 DIAGNOSIS — J3081 Allergic rhinitis due to animal (cat) (dog) hair and dander: Secondary | ICD-10-CM | POA: Diagnosis not present

## 2024-05-30 NOTE — Telephone Encounter (Signed)
 Prolia  VOB initiated via [email or web address]  Next Prolia  inj DUE: 06/30/24

## 2024-05-31 ENCOUNTER — Other Ambulatory Visit (HOSPITAL_COMMUNITY): Payer: Self-pay

## 2024-05-31 NOTE — Telephone Encounter (Signed)
 Pt ready for scheduling for PROLIA  on or after : 06/30/24  Option# 1: Buy/Bill (Office supplied medication)  Out-of-pocket cost due at time of clinic visit: $357  Number of injection/visits approved: ---  Primary: [employer] Prolia  co-insurance: 20% Admin fee co-insurance: 20%  Secondary: --- Prolia  co-insurance:  Admin fee co-insurance:   Medical Benefit Details: Date Benefits were checked: 05/30/24 Deductible: NO/ Coinsurance: 20%/ Admin Fee: 20%  Prior Auth: N/A PA# Expiration Date:   # of doses approved: ----------------------------------------------------------------------- Option# 2- Med Obtained from pharmacy:  Pharmacy benefit: Copay $164.97 (Paid to pharmacy) Admin Fee: 20% approximately $25 (Pay at clinic)  Prior Auth: N/A PA# Expiration Date:   # of doses approved:   If patient wants fill through the pharmacy benefit please send prescription to: [facility], and include estimated need by date in rx notes. Pharmacy will ship medication directly to the office.  Patient NOT eligible for Prolia  Copay Card. Copay Card can make patient's cost as little as $25. Link to apply: [email or web address]://www.[email or web address].[email or web address]/copay  ** This summary of benefits is an estimation of the patient's out-of-pocket cost. Exact cost may very based on individual plan coverage.

## 2024-06-09 ENCOUNTER — Ambulatory Visit: Admitting: Family Medicine

## 2024-06-09 ENCOUNTER — Ambulatory Visit: Admitting: Podiatry

## 2024-06-09 ENCOUNTER — Encounter: Payer: Self-pay | Admitting: Family Medicine

## 2024-06-09 VITALS — BP 130/72 | HR 65 | Temp 98.0°F | Ht 60.0 in | Wt 129.8 lb

## 2024-06-09 DIAGNOSIS — L309 Dermatitis, unspecified: Secondary | ICD-10-CM

## 2024-06-09 DIAGNOSIS — J301 Allergic rhinitis due to pollen: Secondary | ICD-10-CM | POA: Diagnosis not present

## 2024-06-09 DIAGNOSIS — J3081 Allergic rhinitis due to animal (cat) (dog) hair and dander: Secondary | ICD-10-CM | POA: Diagnosis not present

## 2024-06-09 DIAGNOSIS — J3089 Other allergic rhinitis: Secondary | ICD-10-CM | POA: Diagnosis not present

## 2024-06-09 MED ORDER — VALACYCLOVIR HCL 1 G PO TABS: 1000.0000 mg | ORAL_TABLET | Freq: Three times a day (TID) | ORAL | 0 refills | Status: AC

## 2024-06-09 MED ORDER — TRIAMCINOLONE ACETONIDE 0.1 % EX CREA: 1.0000 | TOPICAL_CREAM | Freq: Two times a day (BID) | CUTANEOUS | 0 refills | Status: DC

## 2024-06-09 NOTE — Patient Instructions (Signed)
 I have sent in Valtrex  for you to take 1 tablet 3 times a day for 7 days.  I have sent in triamcinolone  cream for you. You may use this twice a day.  Follow-up with me for new or worsening symptoms.

## 2024-06-09 NOTE — Progress Notes (Signed)
 Acute Office Visit  Subjective:     Patient ID: Maria Montgomery, female    DOB: Jun 19, 1946, 78 y.o.   MRN: [id number]  Chief Complaint  Patient presents with   Acute Visit    Concerning rash under breast area, on upper back, and her right hand. Super itchy, denies fevers. Has tried antihistamines, returned from beach yesterday    HPI  Discussed the use of AI scribe software for clinical note transcription with the patient, who gave verbal consent to proceed.  History of Present Illness Maria Montgomery is an 78 year old female who presents with a rash and itching, concerned about possible shingles.  Pruritic rash - Rash developed on left torso two days ago after returning from the beach - Pruritic but not painful - Tender to touch, especially where bra rubs against it - Previous episode in same area where shingles was suspected but not confirmed - Application of Neosporin and use of antihistamine provided relief of itching - Use of old mometasone  cream with questionable effectiveness  Systemic and constitutional symptoms - No fever, chills, or significant fatigue - Mild afternoon fatigue attributed to busy schedule of packing and moving - No changes in appetite - Feels cold due to room temperature  Allergy history and management - Receives allergy shots since age 27  Physical activity and recent life changes - Participates in water aerobics - Currently in the process of moving to [facility]     ROS Per HPI      Objective:    BP 130/72 (BP Location: Left Arm, Patient Position: Sitting)   Pulse 65   Temp 98 F (36.7 C) (Temporal)   Ht 5' (1.524 m)   Wt 129 lb 12.8 oz (58.9 kg)   LMP 07/28/1987   SpO2 99%   BMI 25.35 kg/m    Physical Exam Vitals and nursing note reviewed.  Constitutional:      General: She is not in acute distress.    Appearance: Normal appearance. She is normal weight.  HENT:     Head: Normocephalic and atraumatic.      Right Ear: External ear normal.     Left Ear: External ear normal.     Nose: Nose normal.     Mouth/Throat:     Mouth: Mucous membranes are moist.     Pharynx: Oropharynx is clear.  Eyes:     Extraocular Movements: Extraocular movements intact.     Pupils: Pupils are equal, round, and reactive to light.  Cardiovascular:     Rate and Rhythm: Normal rate and regular rhythm.     Pulses: Normal pulses.     Heart sounds: Normal heart sounds.  Pulmonary:     Effort: Pulmonary effort is normal. No respiratory distress.     Breath sounds: Normal breath sounds. No wheezing, rhonchi or rales.  Musculoskeletal:        General: Normal range of motion.     Cervical back: Normal range of motion.     Right lower leg: No edema.     Left lower leg: No edema.  Lymphadenopathy:     Cervical: No cervical adenopathy.  Skin:    Findings: Lesion present.         Comments: Area of erythematous, maculopapular rash, no discharge or bleeding noted. Mildly tender.   Neurological:     General: No focal deficit present.     Mental Status: She is alert and oriented to person, place, and time.  Psychiatric:        Mood and Affect: Mood normal.        Thought Content: Thought content normal.     No results found for any visits on 06/09/24.      Assessment & Plan:   Assessment and Plan Assessment & Plan Pruritic skin lesions, possible insect bites vs early shingles Pruritic lesions, likely insect bites or irritant reaction. Shingles less likely due to lack of dermatomal pattern and systemic symptoms. - Prescribe triamcinolone  cream for itching and inflammation. - Prescribe Valtrex  (valacyclovir ) for use if lesions become painful and blistery. - Advise monitoring for increased pain or blistering and initiate Valtrex  if these occur.  Allergic rhinitis on allergen immunotherapy - Continue current allergen immunotherapy regimen.     No orders of the defined types were placed in this encounter.     Meds ordered this encounter  Medications   triamcinolone  cream (KENALOG ) 0.1 %    Sig: Apply 1 Application topically 2 (two) times daily.    Dispense:  30 g    Refill:  0   valACYclovir  (VALTREX ) 1000 MG tablet    Sig: Take 1 tablet (1,000 mg total) by mouth 3 (three) times daily for 7 days.    Dispense:  21 tablet    Refill:  0    Return if symptoms worsen or fail to improve.  Corean LITTIE Ku, FNP

## 2024-06-15 DIAGNOSIS — J3089 Other allergic rhinitis: Secondary | ICD-10-CM | POA: Diagnosis not present

## 2024-06-15 DIAGNOSIS — J3081 Allergic rhinitis due to animal (cat) (dog) hair and dander: Secondary | ICD-10-CM | POA: Diagnosis not present

## 2024-06-15 DIAGNOSIS — J301 Allergic rhinitis due to pollen: Secondary | ICD-10-CM | POA: Diagnosis not present

## 2024-06-16 ENCOUNTER — Ambulatory Visit: Admitting: Podiatry

## 2024-06-17 ENCOUNTER — Other Ambulatory Visit: Payer: Self-pay

## 2024-06-20 ENCOUNTER — Other Ambulatory Visit: Payer: Self-pay

## 2024-06-20 ENCOUNTER — Ambulatory Visit: Payer: PPO | Admitting: Internal Medicine

## 2024-06-20 ENCOUNTER — Encounter: Payer: Self-pay | Admitting: Internal Medicine

## 2024-06-20 VITALS — BP 128/70 | HR 64 | Temp 98.0°F | Ht 60.0 in | Wt 126.8 lb

## 2024-06-20 DIAGNOSIS — Z0001 Encounter for general adult medical examination with abnormal findings: Secondary | ICD-10-CM | POA: Diagnosis not present

## 2024-06-20 DIAGNOSIS — J3089 Other allergic rhinitis: Secondary | ICD-10-CM | POA: Diagnosis not present

## 2024-06-20 DIAGNOSIS — E039 Hypothyroidism, unspecified: Secondary | ICD-10-CM

## 2024-06-20 DIAGNOSIS — I10 Essential (primary) hypertension: Secondary | ICD-10-CM | POA: Diagnosis not present

## 2024-06-20 DIAGNOSIS — J301 Allergic rhinitis due to pollen: Secondary | ICD-10-CM | POA: Diagnosis not present

## 2024-06-20 DIAGNOSIS — N1832 Chronic kidney disease, stage 3b: Secondary | ICD-10-CM | POA: Diagnosis not present

## 2024-06-20 DIAGNOSIS — M810 Age-related osteoporosis without current pathological fracture: Secondary | ICD-10-CM

## 2024-06-20 DIAGNOSIS — J3081 Allergic rhinitis due to animal (cat) (dog) hair and dander: Secondary | ICD-10-CM | POA: Diagnosis not present

## 2024-06-20 DIAGNOSIS — E785 Hyperlipidemia, unspecified: Secondary | ICD-10-CM

## 2024-06-20 DIAGNOSIS — L237 Allergic contact dermatitis due to plants, except food: Secondary | ICD-10-CM

## 2024-06-20 DIAGNOSIS — E559 Vitamin D deficiency, unspecified: Secondary | ICD-10-CM | POA: Diagnosis not present

## 2024-06-20 LAB — CBC WITH DIFFERENTIAL/PLATELET
Basophils Absolute: 0.1 K/uL (ref 0.0–0.1)
Basophils Relative: 0.9 % (ref 0.0–3.0)
Eosinophils Absolute: 0.1 K/uL (ref 0.0–0.7)
Eosinophils Relative: 1 % (ref 0.0–5.0)
HCT: 43 % (ref 36.0–46.0)
Hemoglobin: 14.2 g/dL (ref 12.0–15.0)
Lymphocytes Relative: 25.8 % (ref 12.0–46.0)
Lymphs Abs: 1.5 K/uL (ref 0.7–4.0)
MCHC: 33 g/dL (ref 30.0–36.0)
MCV: 93.3 fl (ref 78.0–100.0)
Monocytes Absolute: 0.9 K/uL (ref 0.1–1.0)
Monocytes Relative: 14.9 % — ABNORMAL HIGH (ref 3.0–12.0)
Neutro Abs: 3.3 K/uL (ref 1.4–7.7)
Neutrophils Relative %: 57.4 % (ref 43.0–77.0)
Platelets: 244 K/uL (ref 150.0–400.0)
RBC: 4.61 Mil/uL (ref 3.87–5.11)
RDW: 13.6 % (ref 11.5–15.5)
WBC: 5.8 K/uL (ref 4.0–10.5)

## 2024-06-20 LAB — URINALYSIS, ROUTINE W REFLEX MICROSCOPIC
Bilirubin Urine: NEGATIVE
Hgb urine dipstick: NEGATIVE
Ketones, ur: NEGATIVE
Leukocytes,Ua: NEGATIVE
Nitrite: NEGATIVE
RBC / HPF: NONE SEEN (ref 0–?)
Specific Gravity, Urine: 1.02 (ref 1.000–1.030)
Total Protein, Urine: NEGATIVE
Urine Glucose: >=1000 — AB
Urobilinogen, UA: 0.2 (ref 0.0–1.0)
pH: 5.5 (ref 5.0–8.0)

## 2024-06-20 LAB — BASIC METABOLIC PANEL WITH GFR
BUN: 17 mg/dL (ref 6–23)
CO2: 27 meq/L (ref 19–32)
Calcium: 9.4 mg/dL (ref 8.4–10.5)
Chloride: 103 meq/L (ref 96–112)
Creatinine, Ser: 0.84 mg/dL (ref 0.40–1.20)
GFR: 65.63 mL/min (ref >60.00)
Glucose, Bld: 89 mg/dL (ref 70–99)
Potassium: 4 meq/L (ref 3.5–5.1)
Sodium: 140 meq/L (ref 135–145)

## 2024-06-20 LAB — PHOSPHORUS: Phosphorus: 3.4 mg/dL (ref 2.3–4.6)

## 2024-06-20 LAB — LIPID PANEL
Cholesterol: 158 mg/dL (ref 0–200)
HDL: 88.9 mg/dL (ref >39.00)
LDL Cholesterol: 53 mg/dL (ref 0–99)
NonHDL: 69.29
Total CHOL/HDL Ratio: 2
Triglycerides: 82 mg/dL (ref 0.0–149.0)
VLDL: 16.4 mg/dL (ref 0.0–40.0)

## 2024-06-20 LAB — HEPATIC FUNCTION PANEL
ALT: 17 U/L (ref 0–35)
AST: 21 U/L (ref 0–37)
Albumin: 4.5 g/dL (ref 3.5–5.2)
Alkaline Phosphatase: 39 U/L (ref 39–117)
Bilirubin, Direct: 0.1 mg/dL (ref 0.0–0.3)
Total Bilirubin: 0.8 mg/dL (ref 0.2–1.2)
Total Protein: 7.2 g/dL (ref 6.0–8.3)

## 2024-06-20 LAB — VITAMIN D 25 HYDROXY (VIT D DEFICIENCY, FRACTURES): VITD: 39.48 ng/mL (ref 30.00–100.00)

## 2024-06-20 MED ORDER — FLUOCINONIDE 0.05 % EX OINT: 1.0000 | TOPICAL_OINTMENT | Freq: Two times a day (BID) | CUTANEOUS | 1 refills | Status: AC

## 2024-06-20 NOTE — Patient Instructions (Signed)

## 2024-06-20 NOTE — Progress Notes (Signed)
 "     Subjective:  Patient ID: Maria Montgomery, female    DOB: Oct 13, 1946  Age: 78 y.o. MRN: [id number]  CC: Annual Exam (Discuss Jardiance  ), Hypothyroidism, Hyperlipidemia, and Rash   HPI Maria Montgomery presents for a CPX and f/up -----  Discussed the use of AI scribe software for clinical note transcription with the patient, who gave verbal consent to proceed.  History of Present Illness Maria Montgomery Maria Montgomery is an 78 year old female who presents with a right lower leg issue and recent weight loss.  She experiences soreness in her right lower leg, specifically in the back of her calf, which sometimes occurs when driving for extended periods. The pain is intermittent and managed with Tylenol. The symptoms have improved over time.  She notes a recent weight loss, weighing 126.6 pounds today, down from the 130s last year. She attributes this to possibly not eating as much and mentions starting Jardiance  around the time the weight loss began. No nausea, vomiting, or other gastrointestinal symptoms. She notes increased urination in the mornings since starting Jardiance .  She developed a rash after a beach trip, which she was concerned might be shingles, but it was not painful, only itchy. Triamcinolone  cream has significantly reduced the rash over the past ten days. She also took Zyrtec for a few days to manage the itching. She has a history of allergies and sees an allergist regularly.  No chest pain, shortness of breath, dizziness, lightheadedness, palpitations, or any abnormal heart sensations during physical activities like water aerobics. She reports feeling her heart sometimes but not during aerobics. No issues with bowel movements or urination.    Outpatient Medications Prior to Visit  Medication Sig Dispense Refill   azelastine  (OPTIVAR ) 0.05 % ophthalmic solution Place 1 drop into both eyes 2 (two) times daily. 6 mL 5   Bepotastine  Besilate (BEPREVE) 1.5 % SOLN Apply to eye.  (Patient not taking: Reported on 06/21/2024)     BIOTIN PO Take 10,000 mg by mouth.     Cetirizine HCl 10 MG CAPS Take by mouth as needed. Reported on 11/21/2015     Cholecalciferol (VITAMIN D  PO) Take 5,000 Int'l Units by mouth.     denosumab  (PROLIA ) 60 MG/ML SOSY injection Inject 60 mg into the skin every 6 (six) months. 1 mL 0   empagliflozin  (JARDIANCE ) 10 MG TABS tablet Take 1 tablet (10 mg total) by mouth daily before breakfast. 90 tablet 1   EPINEPHrine 0.3 mg/0.3 mL IJ SOAJ injection      fluticasone (FLONASE) 50 MCG/ACT nasal spray SHAKE LQ AND U 1 TO 2 SPRAYS IEN QD     montelukast (SINGULAIR) 10 MG tablet Take 10 mg by mouth at bedtime as needed.     rosuvastatin  (CRESTOR ) 5 MG tablet Take 1 tablet (5 mg total) by mouth daily. 90 tablet 1   UNABLE TO FIND Allergy shots 2 shots every 2 weeks     zolpidem  (AMBIEN ) 5 MG tablet TAKE 1 TABLET(5 MG) BY MOUTH AT BEDTIME AS NEEDED FOR SLEEP 90 tablet 0   estradiol  (ESTRACE ) 0.1 MG/GM vaginal cream Place 1 g vaginally 2 (two) times a week. 42.5 g 2   levothyroxine  (SYNTHROID ) 50 MCG tablet TAKE 1 TABLET(50 MCG) BY MOUTH DAILY BEFORE BREAKFAST 90 tablet 1   methylPREDNISolone  (MEDROL  DOSEPAK) 4 MG TBPK tablet Take 6 tablets on day 1.  Take 5 tablets on day 2.  Take 4 tablets on day 3.  Take 3  tablets on day 4.  Take 2 tablets on day 5.  Take 1 tablet on day 6. 21 tablet 0   mometasone  (ELOCON ) 0.1 % cream Apply 1 application topically 2 (two) times daily. 45 g 0   triamcinolone  cream (KENALOG ) 0.1 % Apply 1 Application topically 2 (two) times daily. 30 g 0   Facility-Administered Medications Prior to Visit  Medication Dose Route Frequency Provider Last Rate Last Admin   [START ON 06/30/2024] denosumab  (PROLIA ) injection 60 mg  60 mg Subcutaneous Q6 months Joshua Debby CROME, MD        ROS Review of Systems  Constitutional:  Negative for appetite change, chills, diaphoresis, fatigue and fever.  HENT: Negative.  Negative for sore throat and  trouble swallowing.   Eyes: Negative.  Negative for visual disturbance.  Respiratory: Negative.  Negative for cough, chest tightness, shortness of breath and wheezing.   Cardiovascular:  Negative for chest pain, palpitations and leg swelling.  Gastrointestinal: Negative.  Negative for abdominal pain, constipation, diarrhea, nausea and vomiting.  Endocrine: Negative.   Genitourinary: Negative.  Negative for difficulty urinating.  Musculoskeletal:  Positive for arthralgias. Negative for joint swelling and myalgias.  Skin:  Positive for color change and rash.  Neurological:  Negative for dizziness and weakness.  Hematological:  Negative for adenopathy. Does not bruise/bleed easily.  Psychiatric/Behavioral: Negative.      Objective:  BP 128/70 (BP Location: Left Arm, Patient Position: Sitting)   Pulse 64   Temp 98 F (36.7 C) (Temporal)   Ht 5' (1.524 m)   Wt 126 lb 12.8 oz (57.5 kg)   LMP 07/28/1987   SpO2 94%   BMI 24.76 kg/m   BP Readings from Last 3 Encounters:  06/21/24 122/70  06/20/24 128/70  06/09/24 130/72    Wt Readings from Last 3 Encounters:  06/21/24 127 lb (57.6 kg)  06/20/24 126 lb 12.8 oz (57.5 kg)  06/09/24 129 lb 12.8 oz (58.9 kg)    Physical Exam Vitals reviewed.  Constitutional:      Appearance: Normal appearance.  HENT:     Nose: Nose normal.     Mouth/Throat:     Mouth: Mucous membranes are moist.  Eyes:     General: No scleral icterus.    Conjunctiva/sclera: Conjunctivae normal.  Cardiovascular:     Rate and Rhythm: Normal rate and regular rhythm.     Heart sounds: No murmur heard.    No friction rub. No gallop.  Pulmonary:     Effort: Pulmonary effort is normal.     Breath sounds: No stridor. No wheezing, rhonchi or rales.  Abdominal:     General: Abdomen is flat.     Palpations: There is no mass.     Tenderness: There is no abdominal tenderness. There is no guarding.     Hernia: No hernia is present.  Musculoskeletal:        General:  Normal range of motion.     Cervical back: Neck supple.     Right lower leg: No edema.     Left lower leg: No edema.  Lymphadenopathy:     Cervical: No cervical adenopathy.  Skin:    General: Skin is warm and dry.     Coloration: Skin is not jaundiced.     Findings: Erythema and rash present. No bruising.  Neurological:     General: No focal deficit present.     Mental Status: She is alert. Mental status is at baseline.  Psychiatric:  Mood and Affect: Mood normal.        Behavior: Behavior normal.     Lab Results  Component Value Date   WBC 5.8 06/20/2024   HGB 14.2 06/20/2024   HCT 43.0 06/20/2024   PLT 244.0 06/20/2024   GLUCOSE 89 06/20/2024   CHOL 158 06/20/2024   TRIG 82.0 06/20/2024   HDL 88.90 06/20/2024   LDLDIRECT 97.6 02/05/2009   LDLCALC 53 06/20/2024   ALT 17 06/20/2024   AST 21 06/20/2024   NA 140 06/20/2024   K 4.0 06/20/2024   CL 103 06/20/2024   CREATININE 0.84 06/20/2024   BUN 17 06/20/2024   CO2 27 06/20/2024   TSH 2.13 06/20/2024    DG Finger Ring Left Result Date: 04/22/2018 CLINICAL DATA:  Injured the fourth finger 3 weeks ago with pain and swelling of the PIP joint EXAM: LEFT RING FINGER 2+V COMPARISON:  Left hand films of 05/14/2015 FINDINGS: There may be a tiny nondisplaced avulsion from the distal aspect of the proximal phalanx of the left fourth digit with adjacent soft tissue swelling. No other acute abnormality is seen. Mild degenerative changes present involving the DIP and PIP joints IMPRESSION: 1. Questionable small avulsion fragment from the distal aspect of the proximal phalanx of the left fourth digit with soft tissue swelling. 2. Degenerative change of the DIP joints. Electronically Signed   By: Deward Dames M.D.   On: 04/22/2018 08:46    Assessment & Plan:  Acquired hypothyroidism- She is euthyroid. -     Thyroid  Panel With TSH; Future -     Levothyroxine  Sodium; TAKE 1 TABLET(50 MCG) BY MOUTH DAILY BEFORE BREAKFAST  Dispense:  90 tablet; Refill: 1  Hyperlipidemia LDL goal <100 -     Lipid panel; Future -     Hepatic function panel; Future  Vitamin D  deficiency -     Phosphorus; Future -     VITAMIN D  25 Hydroxy (Vit-D Deficiency, Fractures); Future  Encounter for general adult medical examination with abnormal findings- Exam completed, labs reviewed, vaccines reviewed, no cancer screenings indicated, pt ed material was given.   Benign systolic hypertension -     Urinalysis, Routine w reflex microscopic; Future -     Basic metabolic panel with GFR; Future -     CBC with Differential/Platelet; Future  Stage 3b chronic kidney disease (HCC)- Renal function has improved. -     Urinalysis, Routine w reflex microscopic; Future -     Basic metabolic panel with GFR; Future  Age-related osteoporosis without current pathological fracture -     Phosphorus; Future -     Hepatic function panel; Future -     Basic metabolic panel with GFR; Future -     VITAMIN D  25 Hydroxy (Vit-D Deficiency, Fractures); Future  Allergic contact dermatitis due to plants, except food -     Fluocinonide ; Apply 1 Application topically 2 (two) times daily.  Dispense: 30 g; Refill: 1     Follow-up: Return in about 6 months (around 12/21/2024).  Debby Molt, MD "

## 2024-06-21 ENCOUNTER — Ambulatory Visit (INDEPENDENT_AMBULATORY_CARE_PROVIDER_SITE_OTHER): Admitting: Nurse Practitioner

## 2024-06-21 ENCOUNTER — Encounter: Payer: Self-pay | Admitting: Nurse Practitioner

## 2024-06-21 ENCOUNTER — Ambulatory Visit: Payer: Self-pay | Admitting: Internal Medicine

## 2024-06-21 ENCOUNTER — Other Ambulatory Visit: Payer: Self-pay | Admitting: Internal Medicine

## 2024-06-21 ENCOUNTER — Other Ambulatory Visit (HOSPITAL_COMMUNITY): Payer: Self-pay

## 2024-06-21 ENCOUNTER — Other Ambulatory Visit: Payer: Self-pay

## 2024-06-21 ENCOUNTER — Telehealth: Payer: Self-pay | Admitting: Internal Medicine

## 2024-06-21 VITALS — BP 122/70 | HR 86 | Ht 59.0 in | Wt 127.0 lb

## 2024-06-21 DIAGNOSIS — Z01419 Encounter for gynecological examination (general) (routine) without abnormal findings: Secondary | ICD-10-CM | POA: Diagnosis not present

## 2024-06-21 DIAGNOSIS — M8589 Other specified disorders of bone density and structure, multiple sites: Secondary | ICD-10-CM

## 2024-06-21 DIAGNOSIS — N952 Postmenopausal atrophic vaginitis: Secondary | ICD-10-CM | POA: Diagnosis not present

## 2024-06-21 DIAGNOSIS — N1832 Chronic kidney disease, stage 3b: Secondary | ICD-10-CM

## 2024-06-21 DIAGNOSIS — Z780 Asymptomatic menopausal state: Secondary | ICD-10-CM

## 2024-06-21 DIAGNOSIS — M810 Age-related osteoporosis without current pathological fracture: Secondary | ICD-10-CM

## 2024-06-21 LAB — THYROID PANEL WITH TSH
Free Thyroxine Index: 2.1 (ref 1.4–3.8)
T3 Uptake: 34 % (ref 22–35)
T4, Total: 6.3 ug/dL (ref 5.1–11.9)
TSH: 2.13 m[IU]/L (ref 0.40–4.50)

## 2024-06-21 MED ORDER — ESTRADIOL 0.1 MG/GM VA CREA: 1.0000 g | TOPICAL_CREAM | VAGINAL | 2 refills | Status: AC

## 2024-06-21 MED ORDER — LEVOTHYROXINE SODIUM 50 MCG PO TABS: ORAL_TABLET | ORAL | 1 refills | Status: AC

## 2024-06-21 NOTE — Telephone Encounter (Signed)
 Copied from CRM [id number]. Topic: Appointments - Scheduling Inquiry for Clinic >> Jun 21, 2024 12:08 PM Maria Montgomery wrote: Reason for CRM: Patient called in regarding the Prolia  shot, would like a callback when she can be scheduled for it

## 2024-06-21 NOTE — Progress Notes (Signed)
 Maria Montgomery [birth year] [id number]   History:  78 y.o. [id number] presents for breast and pelvic exam. Postmenopausal. Using vaginal estrogen cream 1-2 times per week for dryness. S/P 1988 TVH for prolapse. Normal pap history. Hypothyroidism and Vitamin D  deficiency managed by PCP. Receiving Prolia  for osteopenia through PCP, thinks she is overdue.   Gynecologic History Patient's last menstrual period was 07/28/1987.   Contraception: status post hysterectomy Sexually active: No  Health Maintenance Last Pap: 04/10/2014. Results were: Normal Last mammogram: 04/30/2024. Results were: Normal Last colonoscopy: 06/16/2012. Results were: Normal, 10-year recall Last Dexa: 08/14/2022. Results were: T-score -1.8, FRAX 14% / 3.8%  Past medical history, past surgical history, family history and social history were all reviewed and documented in the EPIC chart. Married. Moving in [place] in October. Son lives near [place] - has 2 daughters and 3 stepchildren. Daughter lives in [place], [place] - has 3 sons.   ROS:  A ROS was performed and pertinent positives and negatives are included.  Exam:  Vitals:   06/21/24 0957  BP: 122/70  Pulse: 86  SpO2: 99%  Weight: 127 lb (57.6 kg)  Height: 4' 11 (1.499 m)    Body mass index is 25.65 kg/m.  General appearance:  Normal Thyroid :  Symmetrical, normal in size, without palpable masses or nodularity. Respiratory  Auscultation:  Clear without wheezing or rhonchi Cardiovascular  Auscultation:  Regular rate, without rubs, murmurs or gallops  Edema/varicosities:  Not grossly evident Abdominal  Soft,nontender, without masses, guarding or rebound.  Liver/spleen:  No organomegaly noted  Hernia:  None appreciated  Skin  Inspection:  Grossly normal Breasts: Examined lying and sitting.   Right: Without masses, retractions, nipple discharge or axillary adenopathy.   Left: Without masses, retractions, nipple discharge or axillary adenopathy. Pelvic:  External genitalia:  no lesions              Urethra:  normal appearing urethra with no masses, tenderness or lesions              Bartholins and Skenes: normal                 Vagina: normal appearing vagina with normal color and discharge, no lesions. Atrophic changes              Cervix: absent Bimanual Exam:  Uterus:  absent              Adnexa: no mass, fullness, tenderness              Rectovaginal: Deferred              Anus:  normal, no lesions  Dereck Keas, CMA present as chaperone.   Assessment/Plan:  78 y.o. [id number] for breast and pelvic exam.   Encounter for breast and pelvic examination - Education provided on SBEs, importance of preventative screenings, current guidelines, high calcium  diet, regular exercise, and multivitamin daily.  Labs with PCP.   Postmenopausal - S/P 1988 TVH for prolapse.  Osteopenia of multiple sites - 08/2022 T-score -1.8 with elevated risk for hip fracture. Started Prolia  in January. Overdue and will call PCP office to coordinate. Due for DXA in October.   Vaginal atrophy - Plan: estradiol  (ESTRACE ) 0.1 MG/GM vaginal cream twice weekly.   Screening for cervical cancer - Normal Pap history. No longer screening per guidelines.   Screening for breast cancer - Normal mammogram history.  Continue annual screenings.  Normal breast exam today.  Screening for colon cancer - 2013 colonoscopy.  Return in about 1 year (around 06/21/2025) for Med follow up.     Maria DELENA Shutter DNP, 10:31 AM 06/21/2024

## 2024-06-22 ENCOUNTER — Other Ambulatory Visit: Payer: Self-pay

## 2024-06-22 MED ORDER — PROLIA 60 MG/ML ~~LOC~~ SOSY
60.0000 mg | PREFILLED_SYRINGE | SUBCUTANEOUS | 0 refills | Status: AC
  Filled 2024-06-22: qty 1, 180d supply, fill #0

## 2024-06-23 ENCOUNTER — Telehealth: Payer: Self-pay

## 2024-06-23 ENCOUNTER — Other Ambulatory Visit: Payer: Self-pay

## 2024-06-23 DIAGNOSIS — M810 Age-related osteoporosis without current pathological fracture: Secondary | ICD-10-CM

## 2024-06-23 NOTE — Telephone Encounter (Signed)
 Patient is cleared for Prolia  injection on 05/31/2024 per referral information on 01/01/2024.  Mentioned in provider note last on 06/20/2024. Medication is PHARMACY supplied. CoPay:$25

## 2024-06-23 NOTE — Telephone Encounter (Signed)
Please HELP

## 2024-06-27 DIAGNOSIS — J3089 Other allergic rhinitis: Secondary | ICD-10-CM | POA: Diagnosis not present

## 2024-06-27 DIAGNOSIS — J301 Allergic rhinitis due to pollen: Secondary | ICD-10-CM | POA: Diagnosis not present

## 2024-06-27 DIAGNOSIS — J3081 Allergic rhinitis due to animal (cat) (dog) hair and dander: Secondary | ICD-10-CM | POA: Diagnosis not present

## 2024-06-28 NOTE — Telephone Encounter (Signed)
 Copied from CRM #[id number]. Topic: Clinical - Medical Advice >> Jun 28, 2024  7:58 AM Pinkey ORN wrote: Reason for CRM: Prolia  Injection >> Jun 28, 2024  8:00 AM Pinkey ORN wrote: Patient wants to know does she need to pick up her prolia  injection from the pharmacy or will the office be ordering it for her? Please contact patient at [phone]

## 2024-06-29 ENCOUNTER — Other Ambulatory Visit (HOSPITAL_COMMUNITY): Payer: Self-pay

## 2024-06-29 ENCOUNTER — Ambulatory Visit: Admitting: Podiatry

## 2024-06-29 NOTE — Telephone Encounter (Signed)
 It has arrived. Do I need to schedule her ?

## 2024-06-30 ENCOUNTER — Ambulatory Visit (INDEPENDENT_AMBULATORY_CARE_PROVIDER_SITE_OTHER)

## 2024-06-30 ENCOUNTER — Other Ambulatory Visit: Payer: Self-pay

## 2024-06-30 DIAGNOSIS — M810 Age-related osteoporosis without current pathological fracture: Secondary | ICD-10-CM | POA: Diagnosis not present

## 2024-06-30 MED ORDER — DENOSUMAB 60 MG/ML ~~LOC~~ SOSY: 60.0000 mg | PREFILLED_SYRINGE | SUBCUTANEOUS | Status: AC

## 2024-06-30 NOTE — Progress Notes (Signed)
 After obtaining consent, and per orders of Dr. Joshua, injection of B12 given by Ronnald SHAUNNA Palms. Patient instructed to report any adverse reaction to me immediately.

## 2024-07-01 NOTE — Telephone Encounter (Signed)
 Unable to reach the patient. Left her a very detailed message to call the office to get scheduled for a nurse visit for her Prolia  Injection.

## 2024-07-05 ENCOUNTER — Telehealth: Payer: Self-pay | Admitting: Internal Medicine

## 2024-07-05 NOTE — Telephone Encounter (Unsigned)
 Copied from CRM [id number]. Topic: General - Other >> Jul 01, 2024  4:32 PM Dedra B wrote: Reason for CRM: Pt said she received a call saying to make an appt for her prophylactic injection. Pt states that she already had it earlier this week.

## 2024-07-05 NOTE — Telephone Encounter (Signed)
 Pt dropped off her and her husbands paperwork with [facility]  (PHYSICIANS RELEASE FORM) needs to be filled out please call pt back for pick up once this is completed. Please advise, Thanks

## 2024-07-07 ENCOUNTER — Ambulatory Visit: Admitting: Podiatry

## 2024-07-07 NOTE — Telephone Encounter (Signed)
 For has been received. For has been placed on providers desk.

## 2024-07-08 ENCOUNTER — Other Ambulatory Visit: Payer: Self-pay | Admitting: Internal Medicine

## 2024-07-08 DIAGNOSIS — F5101 Primary insomnia: Secondary | ICD-10-CM

## 2024-07-11 NOTE — Telephone Encounter (Signed)
 Paperwork has been completed, signed dated and place up front for pickup. Patient has been made aware.

## 2024-07-12 DIAGNOSIS — J3089 Other allergic rhinitis: Secondary | ICD-10-CM | POA: Diagnosis not present

## 2024-07-12 DIAGNOSIS — J301 Allergic rhinitis due to pollen: Secondary | ICD-10-CM | POA: Diagnosis not present

## 2024-07-12 DIAGNOSIS — J3081 Allergic rhinitis due to animal (cat) (dog) hair and dander: Secondary | ICD-10-CM | POA: Diagnosis not present

## 2024-07-15 DIAGNOSIS — H353132 Nonexudative age-related macular degeneration, bilateral, intermediate dry stage: Secondary | ICD-10-CM | POA: Diagnosis not present

## 2024-07-15 DIAGNOSIS — H04123 Dry eye syndrome of bilateral lacrimal glands: Secondary | ICD-10-CM | POA: Diagnosis not present

## 2024-07-15 DIAGNOSIS — H524 Presbyopia: Secondary | ICD-10-CM | POA: Diagnosis not present

## 2024-07-15 DIAGNOSIS — Z961 Presence of intraocular lens: Secondary | ICD-10-CM | POA: Diagnosis not present

## 2024-07-15 DIAGNOSIS — H52202 Unspecified astigmatism, left eye: Secondary | ICD-10-CM | POA: Diagnosis not present

## 2024-07-20 ENCOUNTER — Ambulatory Visit: Admitting: Podiatry

## 2024-07-20 ENCOUNTER — Encounter: Payer: Self-pay | Admitting: Podiatry

## 2024-07-20 DIAGNOSIS — B070 Plantar wart: Secondary | ICD-10-CM | POA: Diagnosis not present

## 2024-07-20 NOTE — Progress Notes (Signed)
 Subjective:   Patient ID: Maria Montgomery, female   DOB: 78 y.o.   MRN: [id number]   HPI The patient states she has developed a lot of pain on the right plantar foot and has never had a lesion here and its been recent and the digits are doing well currently   ROS      Objective:  Physical Exam  Neurovascular status intact plantar keratotic lesion right plantar foot that upon debridement shows pinpoint bleeding pain to lateral pressure     Assessment:  Probability for verruca plantaris plantar aspect right     Plan:  Sharp sterile debridement of the lesion occurred applied chemical agent to create immune response with sterile dressing explained what to do if blistering were to occur and reappoint as symptoms indicate

## 2024-07-27 ENCOUNTER — Encounter: Payer: Self-pay | Admitting: Pharmacist

## 2024-07-27 NOTE — Progress Notes (Signed)
 Pharmacy Quality Measure Review  This patient is appearing on a report for being at risk of failing the adherence measure for cholesterol (statin) medications this calendar year.   Medication: Rosuvastatin  5 mg Last fill date: 07/14/24 for 90 day supply  Insurance report was not up to date. No action needed at this time.    Darrelyn Drum, PharmD, BCPS, CPP Clinical Pharmacist Practitioner [facility] [phone]

## 2024-08-02 DIAGNOSIS — J3089 Other allergic rhinitis: Secondary | ICD-10-CM | POA: Diagnosis not present

## 2024-08-02 DIAGNOSIS — J3081 Allergic rhinitis due to animal (cat) (dog) hair and dander: Secondary | ICD-10-CM | POA: Diagnosis not present

## 2024-08-02 DIAGNOSIS — J301 Allergic rhinitis due to pollen: Secondary | ICD-10-CM | POA: Diagnosis not present

## 2024-08-15 DIAGNOSIS — M8589 Other specified disorders of bone density and structure, multiple sites: Secondary | ICD-10-CM | POA: Diagnosis not present

## 2024-08-15 LAB — HM DEXA SCAN

## 2024-08-16 ENCOUNTER — Encounter: Payer: Self-pay | Admitting: Internal Medicine

## 2024-08-17 DIAGNOSIS — J301 Allergic rhinitis due to pollen: Secondary | ICD-10-CM | POA: Diagnosis not present

## 2024-08-17 DIAGNOSIS — J3081 Allergic rhinitis due to animal (cat) (dog) hair and dander: Secondary | ICD-10-CM | POA: Diagnosis not present

## 2024-08-17 DIAGNOSIS — J3089 Other allergic rhinitis: Secondary | ICD-10-CM | POA: Diagnosis not present

## 2024-08-19 DIAGNOSIS — J3081 Allergic rhinitis due to animal (cat) (dog) hair and dander: Secondary | ICD-10-CM | POA: Diagnosis not present

## 2024-08-19 DIAGNOSIS — J301 Allergic rhinitis due to pollen: Secondary | ICD-10-CM | POA: Diagnosis not present

## 2024-08-19 DIAGNOSIS — J3089 Other allergic rhinitis: Secondary | ICD-10-CM | POA: Diagnosis not present

## 2024-08-22 ENCOUNTER — Encounter: Payer: Self-pay | Admitting: Podiatry

## 2024-08-22 ENCOUNTER — Ambulatory Visit: Admitting: Podiatry

## 2024-08-22 DIAGNOSIS — B070 Plantar wart: Secondary | ICD-10-CM

## 2024-08-22 NOTE — Progress Notes (Signed)
 Subjective:   Patient ID: Maria Montgomery, female   DOB: 77 y.o.   MRN: [id number]   HPI Patient presents with a lot of pain underneath her right foot stating it is still sore   ROS      Objective:  Physical Exam  Neurovascular status intact with inflammation pain underneath the right foot lesion formation pinpoint bleeding noted upon debridement     Assessment:  Probability for stubborn verruca plantaris plantar right     Plan:  Sterile sharp debridement of lesion accomplished with application of medication to create immune response with sterile dressing and explained what to do if blistering were to occur

## 2024-08-30 DIAGNOSIS — J3089 Other allergic rhinitis: Secondary | ICD-10-CM | POA: Diagnosis not present

## 2024-08-30 DIAGNOSIS — J301 Allergic rhinitis due to pollen: Secondary | ICD-10-CM | POA: Diagnosis not present

## 2024-08-30 DIAGNOSIS — J3081 Allergic rhinitis due to animal (cat) (dog) hair and dander: Secondary | ICD-10-CM | POA: Diagnosis not present

## 2024-09-19 DIAGNOSIS — J3081 Allergic rhinitis due to animal (cat) (dog) hair and dander: Secondary | ICD-10-CM | POA: Diagnosis not present

## 2024-09-19 DIAGNOSIS — J3089 Other allergic rhinitis: Secondary | ICD-10-CM | POA: Diagnosis not present

## 2024-09-19 DIAGNOSIS — J301 Allergic rhinitis due to pollen: Secondary | ICD-10-CM | POA: Diagnosis not present

## 2024-09-20 DIAGNOSIS — J45991 Cough variant asthma: Secondary | ICD-10-CM | POA: Diagnosis not present

## 2024-09-20 DIAGNOSIS — H1045 Other chronic allergic conjunctivitis: Secondary | ICD-10-CM | POA: Diagnosis not present

## 2024-09-20 DIAGNOSIS — J301 Allergic rhinitis due to pollen: Secondary | ICD-10-CM | POA: Diagnosis not present

## 2024-09-20 DIAGNOSIS — L309 Dermatitis, unspecified: Secondary | ICD-10-CM | POA: Diagnosis not present

## 2024-09-20 DIAGNOSIS — J3089 Other allergic rhinitis: Secondary | ICD-10-CM | POA: Diagnosis not present

## 2024-10-05 ENCOUNTER — Other Ambulatory Visit: Payer: Self-pay | Admitting: Internal Medicine

## 2024-10-05 DIAGNOSIS — N1832 Chronic kidney disease, stage 3b: Secondary | ICD-10-CM

## 2024-10-11 DIAGNOSIS — J3089 Other allergic rhinitis: Secondary | ICD-10-CM | POA: Diagnosis not present

## 2024-10-11 DIAGNOSIS — J301 Allergic rhinitis due to pollen: Secondary | ICD-10-CM | POA: Diagnosis not present

## 2024-10-11 DIAGNOSIS — J3081 Allergic rhinitis due to animal (cat) (dog) hair and dander: Secondary | ICD-10-CM | POA: Diagnosis not present

## 2024-10-18 ENCOUNTER — Other Ambulatory Visit: Payer: Self-pay | Admitting: Pharmacist

## 2024-10-18 DIAGNOSIS — E785 Hyperlipidemia, unspecified: Secondary | ICD-10-CM

## 2024-10-18 MED ORDER — ROSUVASTATIN CALCIUM 5 MG PO TABS: 5.0000 mg | ORAL_TABLET | Freq: Every day | ORAL | 0 refills | Status: AC

## 2024-10-18 NOTE — Progress Notes (Signed)
 Pharmacy Quality Measure Review  This patient is appearing on a report for being at risk of failing the adherence measure for cholesterol (statin) medications this calendar year.   Medication: rosuvastatin  Last fill date: 07/14/24 for 90 day supply  Due for rosuvastatin  refill, has no refills remaining. Last PCP appt August 2025, next appt scheduled for March 2026. Will send refill.  Darrelyn Drum, PharmD, BCPS, CPP Clinical Pharmacist Practitioner [facility] [phone]

## 2024-11-17 ENCOUNTER — Other Ambulatory Visit (HOSPITAL_BASED_OUTPATIENT_CLINIC_OR_DEPARTMENT_OTHER): Payer: Self-pay

## 2024-11-24 ENCOUNTER — Telehealth: Payer: Self-pay

## 2024-11-24 NOTE — Telephone Encounter (Signed)
 Needs Updated PA for Porlia injec as we have her injection on site that was delivered.   Needing updated benefits information on file to know how to charge pt.

## 2024-11-25 ENCOUNTER — Other Ambulatory Visit (HOSPITAL_COMMUNITY): Payer: Self-pay

## 2024-11-25 ENCOUNTER — Telehealth: Payer: Self-pay

## 2024-11-25 NOTE — Telephone Encounter (Signed)
 Prolia  VOB initiated via [email or web address]  Next Prolia  inj DUE: 12/31/24

## 2024-11-25 NOTE — Telephone Encounter (Signed)
 Benefit verification started in new encounter. Will update referral once complete.

## 2024-11-28 ENCOUNTER — Other Ambulatory Visit (HOSPITAL_COMMUNITY): Payer: Self-pay

## 2024-11-28 NOTE — Telephone Encounter (Signed)
 Pt ready for scheduling for PROLIA  on or after : 12/31/24  Option# 1: Buy/Bill (Office supplied medication)  Out-of-pocket cost due at time of clinic visit: $352  Number of injection/visits approved: ---  Primary: [employer] Prolia  co-insurance: 20% Admin fee co-insurance: 0%  Secondary: --- Prolia  co-insurance:  Admin fee co-insurance:   Medical Benefit Details: Date Benefits were checked: 11/25/24 Deductible: NO/ Coinsurance: 20%/ Admin Fee: 0%  Prior Auth: N/A PA# Expiration Date:   # of doses approved: ----------------------------------------------------------------------- Option# 2- Med Obtained from pharmacy: [facility] PREFERRED FOR PHARMACY BENEFIT  Pharmacy benefit: Copay $519.79 (Paid to pharmacy) Admin Fee: 0% (Pay at clinic)  Prior Auth: N/A PA# Expiration Date:   # of doses approved:   If patient wants fill through the pharmacy benefit please send prescription to: [facility], and include estimated need by date in rx notes. Pharmacy will ship medication directly to the office.  Patient NOT eligible for Prolia  Copay Card. Copay Card can make patient's cost as little as $25. Link to apply: [email or web address]://www.[email or web address].[email or web address]/copay  ** This summary of benefits is an estimation of the patient's out-of-pocket cost. Exact cost may very based on individual plan coverage.

## 2024-11-30 ENCOUNTER — Other Ambulatory Visit: Payer: Self-pay

## 2024-12-01 NOTE — Telephone Encounter (Signed)
 1st attempt to call pt to inform her of the following information per the PA team for her Prolia ...  Pt ready for scheduling for PROLIA  on or after : 12/31/24   Option# 1: Buy/Bill (Office supplied medication)   Out-of-pocket cost due at time of clinic visit: $352   Number of injection/visits approved: ---   Primary: [employer] Prolia  co-insurance: 20% Admin fee co-insurance: 0%   Secondary: --- Prolia  co-insurance:  Admin fee co-insurance:    Medical Benefit Details: Date Benefits were checked: 11/25/24 Deductible: NO/ Coinsurance: 20%/ Admin Fee: 0%   Prior Auth: N/A PA# Expiration Date:   # of doses approved: ----------------------------------------------------------------------- Option# 2- Med Obtained from pharmacy: [facility] FOR [facility]ACY BENEFIT   Pharmacy benefit: Copay $519.79 (Paid to pharmacy) Admin Fee: 0% (Pay at clinic)   Prior Auth: N/A PA# Expiration Date:   # of doses approved:     If patient wants fill through the pharmacy benefit please send prescription to: [facility], and include estimated need by date in rx notes. Pharmacy will ship medication directly to the office.   Patient NOT eligible for Prolia  Copay Card.

## 2025-01-02 ENCOUNTER — Ambulatory Visit: Admitting: Internal Medicine

## 2025-06-22 ENCOUNTER — Ambulatory Visit: Admitting: Nurse Practitioner
